# Patient Record
Sex: Male | Born: 1950 | Race: White | Hispanic: No | Marital: Married | Smoking: Never smoker
Health system: Southern US, Community
[De-identification: ages and names within clinical notes are randomized; demographics above are authoritative.]

## PROBLEM LIST (undated history)

## (undated) DIAGNOSIS — T4145XA Adverse effect of unspecified anesthetic, initial encounter: Secondary | ICD-10-CM

## (undated) DIAGNOSIS — K5790 Diverticulosis of intestine, part unspecified, without perforation or abscess without bleeding: Secondary | ICD-10-CM

## (undated) DIAGNOSIS — N529 Male erectile dysfunction, unspecified: Secondary | ICD-10-CM

## (undated) DIAGNOSIS — E785 Hyperlipidemia, unspecified: Secondary | ICD-10-CM

## (undated) DIAGNOSIS — R011 Cardiac murmur, unspecified: Secondary | ICD-10-CM

## (undated) DIAGNOSIS — T7840XA Allergy, unspecified, initial encounter: Secondary | ICD-10-CM

## (undated) DIAGNOSIS — I499 Cardiac arrhythmia, unspecified: Secondary | ICD-10-CM

## (undated) DIAGNOSIS — M1990 Unspecified osteoarthritis, unspecified site: Secondary | ICD-10-CM

## (undated) DIAGNOSIS — D126 Benign neoplasm of colon, unspecified: Secondary | ICD-10-CM

## (undated) DIAGNOSIS — G43909 Migraine, unspecified, not intractable, without status migrainosus: Secondary | ICD-10-CM

## (undated) DIAGNOSIS — I4891 Unspecified atrial fibrillation: Secondary | ICD-10-CM

## (undated) DIAGNOSIS — J309 Allergic rhinitis, unspecified: Secondary | ICD-10-CM

## (undated) DIAGNOSIS — M549 Dorsalgia, unspecified: Secondary | ICD-10-CM

## (undated) DIAGNOSIS — H04123 Dry eye syndrome of bilateral lacrimal glands: Secondary | ICD-10-CM

## (undated) DIAGNOSIS — T8859XA Other complications of anesthesia, initial encounter: Secondary | ICD-10-CM

## (undated) DIAGNOSIS — H269 Unspecified cataract: Secondary | ICD-10-CM

## (undated) HISTORY — DX: Diverticulosis of intestine, part unspecified, without perforation or abscess without bleeding: K57.90

## (undated) HISTORY — PX: NASAL SINUS SURGERY: SHX719

## (undated) HISTORY — DX: Benign neoplasm of colon, unspecified: D12.6

## (undated) HISTORY — DX: Unspecified osteoarthritis, unspecified site: M19.90

## (undated) HISTORY — DX: Other complications of anesthesia, initial encounter: T88.59XA

## (undated) HISTORY — DX: Dorsalgia, unspecified: M54.9

## (undated) HISTORY — DX: Cardiac murmur, unspecified: R01.1

## (undated) HISTORY — DX: Cardiac arrhythmia, unspecified: I49.9

## (undated) HISTORY — DX: Migraine, unspecified, not intractable, without status migrainosus: G43.909

## (undated) HISTORY — DX: Allergy, unspecified, initial encounter: T78.40XA

## (undated) HISTORY — DX: Adverse effect of unspecified anesthetic, initial encounter: T41.45XA

## (undated) HISTORY — DX: Male erectile dysfunction, unspecified: N52.9

## (undated) HISTORY — DX: Hyperlipidemia, unspecified: E78.5

## (undated) HISTORY — DX: Allergic rhinitis, unspecified: J30.9

## (undated) HISTORY — PX: TONSILLECTOMY: SHX5217

## (undated) HISTORY — PX: EYE SURGERY: SHX253

## (undated) HISTORY — DX: Unspecified cataract: H26.9

## (undated) HISTORY — PX: NASAL SEPTUM SURGERY: SHX37

## (undated) HISTORY — DX: Unspecified atrial fibrillation: I48.91

## (undated) HISTORY — DX: Dry eye syndrome of bilateral lacrimal glands: H04.123

---

## 2004-06-01 ENCOUNTER — Ambulatory Visit: Payer: Self-pay | Admitting: Internal Medicine

## 2004-12-01 ENCOUNTER — Ambulatory Visit: Payer: Self-pay | Admitting: Internal Medicine

## 2004-12-15 ENCOUNTER — Ambulatory Visit: Payer: Self-pay | Admitting: Family Medicine

## 2005-03-05 ENCOUNTER — Ambulatory Visit: Payer: Self-pay | Admitting: Internal Medicine

## 2005-04-06 ENCOUNTER — Ambulatory Visit: Payer: Self-pay | Admitting: Internal Medicine

## 2005-08-21 ENCOUNTER — Ambulatory Visit: Payer: Self-pay | Admitting: Internal Medicine

## 2005-09-13 ENCOUNTER — Ambulatory Visit: Payer: Self-pay | Admitting: Internal Medicine

## 2006-02-26 ENCOUNTER — Ambulatory Visit: Payer: Self-pay | Admitting: Internal Medicine

## 2006-03-04 ENCOUNTER — Ambulatory Visit: Payer: Self-pay | Admitting: Internal Medicine

## 2006-04-04 ENCOUNTER — Ambulatory Visit: Payer: Self-pay | Admitting: Gastroenterology

## 2006-04-11 ENCOUNTER — Ambulatory Visit: Payer: Self-pay | Admitting: Internal Medicine

## 2006-04-15 ENCOUNTER — Encounter: Payer: Self-pay | Admitting: Internal Medicine

## 2006-04-15 ENCOUNTER — Ambulatory Visit: Payer: Self-pay | Admitting: Gastroenterology

## 2007-03-26 DIAGNOSIS — J309 Allergic rhinitis, unspecified: Secondary | ICD-10-CM | POA: Insufficient documentation

## 2007-03-26 HISTORY — DX: Allergic rhinitis, unspecified: J30.9

## 2007-04-09 ENCOUNTER — Ambulatory Visit: Payer: Self-pay | Admitting: Internal Medicine

## 2007-07-01 ENCOUNTER — Telehealth: Payer: Self-pay | Admitting: *Deleted

## 2007-07-10 ENCOUNTER — Encounter: Payer: Self-pay | Admitting: Internal Medicine

## 2008-01-16 ENCOUNTER — Telehealth: Payer: Self-pay | Admitting: Internal Medicine

## 2008-02-23 ENCOUNTER — Ambulatory Visit: Payer: Self-pay | Admitting: Internal Medicine

## 2008-02-23 DIAGNOSIS — H6120 Impacted cerumen, unspecified ear: Secondary | ICD-10-CM | POA: Insufficient documentation

## 2008-02-26 ENCOUNTER — Telehealth: Payer: Self-pay | Admitting: Internal Medicine

## 2008-03-08 ENCOUNTER — Encounter: Payer: Self-pay | Admitting: Internal Medicine

## 2008-03-27 ENCOUNTER — Ambulatory Visit: Payer: Self-pay | Admitting: Internal Medicine

## 2008-05-04 ENCOUNTER — Ambulatory Visit: Payer: Self-pay | Admitting: Internal Medicine

## 2008-05-04 DIAGNOSIS — J069 Acute upper respiratory infection, unspecified: Secondary | ICD-10-CM | POA: Insufficient documentation

## 2008-09-27 ENCOUNTER — Telehealth: Payer: Self-pay | Admitting: Internal Medicine

## 2008-11-03 ENCOUNTER — Ambulatory Visit: Payer: Self-pay | Admitting: Internal Medicine

## 2008-11-03 DIAGNOSIS — G43909 Migraine, unspecified, not intractable, without status migrainosus: Secondary | ICD-10-CM

## 2008-11-03 HISTORY — DX: Migraine, unspecified, not intractable, without status migrainosus: G43.909

## 2009-03-17 ENCOUNTER — Telehealth: Payer: Self-pay | Admitting: Internal Medicine

## 2009-03-21 ENCOUNTER — Encounter: Payer: Self-pay | Admitting: Internal Medicine

## 2009-03-23 ENCOUNTER — Encounter: Payer: Self-pay | Admitting: Internal Medicine

## 2009-03-30 ENCOUNTER — Encounter (INDEPENDENT_AMBULATORY_CARE_PROVIDER_SITE_OTHER): Payer: Self-pay

## 2009-04-19 ENCOUNTER — Ambulatory Visit: Payer: Self-pay | Admitting: Internal Medicine

## 2009-04-19 DIAGNOSIS — N529 Male erectile dysfunction, unspecified: Secondary | ICD-10-CM

## 2009-04-19 HISTORY — DX: Male erectile dysfunction, unspecified: N52.9

## 2009-07-07 ENCOUNTER — Ambulatory Visit: Payer: Self-pay | Admitting: Internal Medicine

## 2009-07-07 LAB — CONVERTED CEMR LAB
BUN: 10 mg/dL (ref 6–23)
Basophils Absolute: 0.1 10*3/uL (ref 0.0–0.1)
Bilirubin Urine: NEGATIVE
Bilirubin, Direct: 0 mg/dL (ref 0.0–0.3)
CO2: 27 meq/L (ref 19–32)
Chloride: 98 meq/L (ref 96–112)
Cholesterol: 269 mg/dL — ABNORMAL HIGH (ref 0–200)
Creatinine, Ser: 0.9 mg/dL (ref 0.4–1.5)
Direct LDL: 156.9 mg/dL
Eosinophils Absolute: 0.3 10*3/uL (ref 0.0–0.7)
Glucose, Urine, Semiquant: NEGATIVE
MCHC: 32.4 g/dL (ref 30.0–36.0)
MCV: 98.5 fL (ref 78.0–100.0)
Monocytes Absolute: 0.6 10*3/uL (ref 0.1–1.0)
Neutrophils Relative %: 58.1 % (ref 43.0–77.0)
PSA: 1.59 ng/mL (ref 0.10–4.00)
Platelets: 310 10*3/uL (ref 150.0–400.0)
Protein, U semiquant: NEGATIVE
RDW: 12.9 % (ref 11.5–14.6)
Total Bilirubin: 0.6 mg/dL (ref 0.3–1.2)
VLDL: 54.2 mg/dL — ABNORMAL HIGH (ref 0.0–40.0)
WBC: 5.7 10*3/uL (ref 4.5–10.5)
pH: 5.5

## 2009-07-14 ENCOUNTER — Ambulatory Visit: Payer: Self-pay | Admitting: Internal Medicine

## 2009-08-02 ENCOUNTER — Telehealth: Payer: Self-pay | Admitting: Internal Medicine

## 2010-03-07 ENCOUNTER — Ambulatory Visit: Payer: Self-pay | Admitting: Internal Medicine

## 2010-05-17 ENCOUNTER — Ambulatory Visit: Payer: Self-pay | Admitting: Internal Medicine

## 2010-07-03 ENCOUNTER — Telehealth: Payer: Self-pay | Admitting: Internal Medicine

## 2010-07-25 NOTE — Assessment & Plan Note (Signed)
Summary: EAR PAIN / CONGESTION // RS   Vital Signs:  Patient profile:   60 year old male Weight:      163 pounds Temp:     98.5 degrees F oral BP sitting:   106 / 70  (right arm) Cuff size:   regular  Vitals Entered By: Duard Brady LPN (May 17, 2010 10:16 AM) CC: (R) ear pain , sinus congestion Is Patient Diabetic? No   CC:  (R) ear pain  and sinus congestion.  History of Present Illness: a 60 year old patient who is seen today with a one-day history of pain in and diminished hearing from the right ear.  There is been no drainage.  He has had cerumen impactions requiring irrigation in the past.  No fever.  He has had some minor sinus congestion.  He has dyslipidemia, and allergic rhinitis, which have been stable.  Allergies: 1)  ! Erythromycin  Past History:  Past Medical History: High Cholesterol MHA Allergic rhinitis  testosterone deficiency ED cerumen impaction  Review of Systems       The patient complains of decreased hearing.  The patient denies anorexia, fever, weight loss, weight gain, vision loss, hoarseness, chest pain, syncope, dyspnea on exertion, peripheral edema, prolonged cough, headaches, hemoptysis, abdominal pain, melena, hematochezia, severe indigestion/heartburn, hematuria, incontinence, genital sores, muscle weakness, suspicious skin lesions, transient blindness, difficulty walking, depression, unusual weight change, abnormal bleeding, enlarged lymph nodes, angioedema, breast masses, and testicular masses.    Physical Exam  General:  Well-developed,well-nourished,in no acute distress; alert,appropriate and cooperative throughout examination Head:  Normocephalic and atraumatic without obvious abnormalities. No apparent alopecia or balding. Eyes:  No corneal or conjunctival inflammation noted. EOMI. Perrla. Funduscopic exam benign, without hemorrhages, exudates or papilledema. Vision grossly normal. Ears:  cerumen impaction on the  right   Impression & Recommendations:  Problem # 1:  CERUMEN IMPACTION, RIGHT (ICD-380.4) irrigated until clear  Problem # 2:  ALLERGIC RHINITIS (ICD-477.9)  His updated medication list for this problem includes:    Claritin 10 Mg Tabs (Loratadine) .Marland Kitchen... 1 once daily as needed    Nasonex 50 Mcg/act Susp (Mometasone furoate) .Marland Kitchen... As directed  Complete Medication List: 1)  Niaspan 500 Mg Tbcr (Niacin (antihyperlipidemic)) 2)  Claritin 10 Mg Tabs (Loratadine) .Marland Kitchen.. 1 once daily as needed 3)  Relpax 20 Mg Tabs (Eletriptan hydrobromide) .Marland Kitchen.. 1 by mouth as needed migraine; may repeat in 4 hrs 4)  Nasonex 50 Mcg/act Susp (Mometasone furoate) .... As directed 5)  Androgel 50 Mg/5gm Gel (Testosterone) .Marland Kitchen.. 1 once daily 6)  Tramadol Hcl 50 Mg Tabs (Tramadol hcl) .Marland Kitchen.. 1 q6h as needed 7)  Levitra 10 Mg Tabs (Vardenafil hcl) .... As directed 8)  Amitriptyline Hcl 25 Mg Tabs (Amitriptyline hcl) .... One at bedtime  Patient Instructions: 1)  Please schedule a follow-up appointment as needed.   Orders Added: 1)  Est. Patient Level III [36644]

## 2010-07-25 NOTE — Procedures (Signed)
Summary: Colonoscopy / [facility]  Colonoscopy / [facility]   Imported By: Lennie Odor 12/09/2009 17:07:03  _____________________________________________________________________  External Attachment:    Type:   Image     Comment:   External Document

## 2010-07-25 NOTE — Miscellaneous (Signed)
Summary: Flu Shot/[place]  Flu Shot/[place]   Imported By: Maryln Gottron 12/09/2009 11:01:26  _____________________________________________________________________  External Attachment:    Type:   Image     Comment:   External Document

## 2010-07-25 NOTE — Assessment & Plan Note (Signed)
Summary: migraines/cjr   Vital Signs:  Patient profile:   60 year old male Weight:      161 pounds Temp:     98.1 degrees F oral BP sitting:   100 / 68  (left arm) Cuff size:   regular  Vitals Entered By: Duard Brady LPN (March 07, 2010 11:03 AM) CC: to discuss migraines Is Patient Diabetic? No Flu Vaccine Consent Questions     Do you have a history of severe allergic reactions to this vaccine? no    Any prior history of allergic reactions to egg and/or gelatin? no    Do you have a sensitivity to the preservative Thimersol? no    Do you have a past history of Guillan-Barre Syndrome? no    Do you currently have an acute febrile illness? no    Have you ever had a severe reaction to latex? no    Vaccine information given and explained to patient? yes    Are you currently pregnant? no    Lot Number:[id number]   Exp Date:12/23/2010   Site Given  Left Deltoid IM   CC:  to discuss migraines.  History of Present Illness: 60 year old patient who has a history of migraine headaches.  More recently, he has had increasing frequency.  They continue  to respond well to Relpax.  He has been sleeping poorly, and also has some allergy related issues that he still may be factors.  He is having several headaches.  Each week  Allergies: 1)  ! Erythromycin  Past History:  Past Medical History: Reviewed history from 07/14/2009 and no changes required. High Cholesterol MHA Allergic rhinitis  testosterone deficiency ED  Review of Systems       The patient complains of headaches.  The patient denies anorexia, fever, weight loss, weight gain, vision loss, decreased hearing, hoarseness, chest pain, syncope, dyspnea on exertion, peripheral edema, prolonged cough, hemoptysis, abdominal pain, melena, hematochezia, severe indigestion/heartburn, hematuria, incontinence, genital sores, muscle weakness, suspicious skin lesions, transient blindness, difficulty walking, depression, unusual  weight change, abnormal bleeding, enlarged lymph nodes, angioedema, breast masses, and testicular masses.    Physical Exam  General:  Well-developed,well-nourished,in no acute distress; alert,appropriate and cooperative throughout examination Head:  Normocephalic and atraumatic without obvious abnormalities. No apparent alopecia or balding. Eyes:  No corneal or conjunctival inflammation noted. EOMI. Perrla. Funduscopic exam benign, without hemorrhages, exudates or papilledema. Vision grossly normal. Ears:  External ear exam shows no significant lesions or deformities.  Otoscopic examination reveals clear canals, tympanic membranes are intact bilaterally without bulging, retraction, inflammation or discharge. Hearing is grossly normal bilaterally. Mouth:  Oral mucosa and oropharynx without lesions or exudates.  Teeth in good repair. Neck:  No deformities, masses, or tenderness noted. Lungs:  Normal respiratory effort, chest expands symmetrically. Lungs are clear to auscultation, no crackles or wheezes. Heart:  Normal rate and regular rhythm. S1 and S2 normal without gallop, murmur, click, rub or other extra sounds. Psych:  Cognition and judgment appear intact. Alert and cooperative with normal attention span and concentration. No apparent delusions, illusions, hallucinations   Impression & Recommendations:  Problem # 1:  MIGRAINE HEADACHE (ICD-346.90)  His updated medication list for this problem includes:    Relpax 20 Mg Tabs (Eletriptan hydrobromide) .Marland Kitchen... 1 by mouth as needed migraine; may repeat in 4 hrs    Tramadol Hcl 50 Mg Tabs (Tramadol hcl) .Marland Kitchen... 1 q6h as needed will try amitriptyline 25 mg at bedtime to assist with sleep, and migraine prophylaxis  Complete  Medication List: 1)  Niaspan 500 Mg Tbcr (Niacin (antihyperlipidemic)) 2)  Claritin 10 Mg Tabs (Loratadine) .Marland Kitchen.. 1 once daily as needed 3)  Relpax 20 Mg Tabs (Eletriptan hydrobromide) .Marland Kitchen.. 1 by mouth as needed migraine; may  repeat in 4 hrs 4)  Nasonex 50 Mcg/act Susp (Mometasone furoate) .... As directed 5)  Androgel 50 Mg/5gm Gel (Testosterone) .Marland Kitchen.. 1 once daily 6)  Tramadol Hcl 50 Mg Tabs (Tramadol hcl) .Marland Kitchen.. 1 q6h as needed 7)  Levitra 10 Mg Tabs (Vardenafil hcl) .... As directed 8)  Amitriptyline Hcl 25 Mg Tabs (Amitriptyline hcl) .... One at bedtime  Other Orders: Admin 1st Vaccine (52841) Flu Vaccine 11yrs + (32440)  Patient Instructions: 1)  Please schedule a follow-up appointment as needed. Prescriptions: AMITRIPTYLINE HCL 25 MG TABS (AMITRIPTYLINE HCL) one at bedtime  #90 x 4   Entered and Authorized by:   Gordy Savers  MD   Signed by:   Gordy Savers  MD on 03/07/2010   Method used:   Print then Give to Patient   RxID:   [id number] LEVITRA 10 MG TABS (VARDENAFIL HCL) as directed  #12 x 12   Entered and Authorized by:   Gordy Savers  MD   Signed by:   Gordy Savers  MD on 03/07/2010   Method used:   Print then Give to Patient   RxID:   [id number] TRAMADOL HCL 50 MG TABS (TRAMADOL HCL) 1 q6h as needed  #100 x 3   Entered and Authorized by:   Gordy Savers  MD   Signed by:   Gordy Savers  MD on 03/07/2010   Method used:   Print then Give to Patient   RxID:   [id number] ANDROGEL 50 MG/5GM GEL (TESTOSTERONE) 1 once daily  #30 x 12   Entered and Authorized by:   Gordy Savers  MD   Signed by:   Gordy Savers  MD on 03/07/2010   Method used:   Print then Give to Patient   RxID:   [id number] NASONEX 50 MCG/ACT  SUSP (MOMETASONE FUROATE) as directed  #17 Gram x 6   Entered and Authorized by:   Gordy Savers  MD   Signed by:   Gordy Savers  MD on 03/07/2010   Method used:   Print then Give to Patient   RxID:   [id number] RELPAX 20 MG  TABS (ELETRIPTAN HYDROBROMIDE) 1 by mouth as needed migraine; may repeat in 4 hrs  #12 x 6   Entered and Authorized by:   Gordy Savers  MD   Signed by:   Gordy Savers  MD on 03/07/2010   Method used:   Print then Give to Patient   RxID:   [id number]

## 2010-07-25 NOTE — Progress Notes (Signed)
Summary: Pt says [facility] needs quantity of Tramadol script  Phone Note Call from Patient Call back at Home Phone [phone]   Caller: Patient Summary of Call: Pt called and said [facility], needs to know the quanity of Tramadol, before they can fill script. Please call Tramadol in to pharmacy. Initial call taken by: Lucy Antigua,  August 02, 2009 9:52 AM    Prescriptions: TRAMADOL HCL 50 MG TABS (TRAMADOL HCL) 1 q6h as needed  #60 x 2   Entered by:   Raechel Ache, RN   Authorized by:   Gordy Savers  MD   Signed by:   Raechel Ache, RN on 08/02/2009   Method used:   Electronically to        [facility] (retail)       [address]       [place], [place]  [address]       Ph: [phone]       Fax: [phone]   RxID:   [id number]

## 2010-07-25 NOTE — Assessment & Plan Note (Signed)
Summary: cpx/njr   Vital Signs:  Patient profile:   60 year old male Weight:      166 pounds BMI:     23.24 Temp:     99.5 degrees F oral BP sitting:   90 / 66  (left arm) Cuff size:   regular  Vitals Entered By: Raechel Ache, RN (July 14, 2009 9:36 AM) CC: CPX, labs done.   CC:  CPX and labs done.Marland Kitchen  History of Present Illness:  60 year old patient who is seen today for a wellness exam.  Medical problems include hypercholesterolemia.  This was discussed.  His total cholesterol 269, up from 219 in the past.  He is allergic rhinitis.  History migraine headaches  Preventive Screening-Counseling & Management  Caffeine-Diet-Exercise     Does Patient Exercise: no [MLI_FORM:[id number]][MLI_FORM:[id number]][MLI_FORM:[id number]][MLI_FORM:[id number]][MLI_FORM:[id number]] [MLI_FORM:[id number]][MLI_FORM:[id number]][MLI_FORM:[id number]][MLI_FORM:[id number]]

## 2010-07-27 NOTE — Progress Notes (Signed)
Summary: refill niaspan  Phone Note From Pharmacy   Caller: [facility] [address] Reason for Call: Needs renewal Summary of Call: needs new rx and sig for niaspan -   gave once daily #90 rf3 to [facility] at [facility]. [facility] Initial call taken by: Duard Brady LPN,  July 03, 2010 5:03 PM    New/Updated Medications: NIASPAN 500 MG TBCR (NIACIN (ANTIHYPERLIPIDEMIC)) qd Prescriptions: NIASPAN 500 MG TBCR (NIACIN (ANTIHYPERLIPIDEMIC)) qd  #90 x 3   Entered by:   Duard Brady LPN   Authorized by:   Gordy Savers  MD   Signed by:   Duard Brady LPN on 44/08/4740   Method used:   Historical   RxID:   [id number]

## 2010-09-27 ENCOUNTER — Other Ambulatory Visit: Payer: Self-pay | Admitting: Internal Medicine

## 2010-10-09 ENCOUNTER — Encounter: Payer: Self-pay | Admitting: Internal Medicine

## 2010-10-09 ENCOUNTER — Ambulatory Visit (INDEPENDENT_AMBULATORY_CARE_PROVIDER_SITE_OTHER): Payer: PRIVATE HEALTH INSURANCE | Admitting: Internal Medicine

## 2010-10-09 VITALS — BP 94/62 | Temp 99.5°F | Wt 168.0 lb

## 2010-10-09 DIAGNOSIS — J309 Allergic rhinitis, unspecified: Secondary | ICD-10-CM

## 2010-10-09 MED ORDER — METHYLPREDNISOLONE ACETATE 80 MG/ML IJ SUSP
80.0000 mg | Freq: Once | INTRAMUSCULAR | Status: AC
  Administered 2010-10-09: 80 mg via INTRAMUSCULAR

## 2010-10-09 MED ORDER — HYDROCODONE-HOMATROPINE 5-1.5 MG/5ML PO SYRP: 5.0000 mL | ORAL_SOLUTION | Freq: Four times a day (QID) | ORAL | Status: AC | PRN

## 2010-10-09 NOTE — Progress Notes (Signed)
Addended by: Duard Brady on: 10/09/2010 11:54 AM   Modules accepted: Orders

## 2010-10-09 NOTE — Progress Notes (Signed)
  Subjective:    Patient ID: Joseph Gill, male    DOB: 1950-09-26, 60 y.o.   MRN: [id number]  HPI  60 year old patient has a history of allergic rhinitis. For the past 4 days she has had increasing sinus congestion drainage and nonproductive cough he does develop mild sore throat. He has been using antihistamines nasal steroids and decongestants as well as cough and expectorants without much benefit.    Review of Systems  Constitutional: Negative for fever, chills, appetite change and fatigue.  HENT: Positive for congestion, rhinorrhea and postnasal drip. Negative for hearing loss, ear pain, sore throat, trouble swallowing, neck stiffness, dental problem, voice change and tinnitus.   Eyes: Negative for pain, discharge and visual disturbance.  Respiratory: Positive for cough. Negative for chest tightness, wheezing and stridor.   Cardiovascular: Negative for chest pain, palpitations and leg swelling.  Gastrointestinal: Negative for nausea, vomiting, abdominal pain, diarrhea, constipation, blood in stool and abdominal distention.  Genitourinary: Negative for urgency, hematuria, flank pain, discharge, difficulty urinating and genital sores.  Musculoskeletal: Negative for myalgias, back pain, joint swelling, arthralgias and gait problem.  Skin: Negative for rash.  Neurological: Negative for dizziness, syncope, speech difficulty, weakness, numbness and headaches.  Hematological: Negative for adenopathy. Does not bruise/bleed easily.  Psychiatric/Behavioral: Negative for behavioral problems and dysphoric mood. The patient is not nervous/anxious.        Objective:   Physical Exam  Constitutional: He is oriented to person, place, and time. He appears well-developed.  HENT:  Head: Normocephalic.  Right Ear: External ear normal.  Left Ear: External ear normal.  Eyes: Conjunctivae and EOM are normal.  Neck: Normal range of motion.  Cardiovascular: Normal rate, regular rhythm and normal heart  sounds.   Pulmonary/Chest: Effort normal and breath sounds normal. No respiratory distress. He has no wheezes. He has no rales.  Abdominal: Bowel sounds are normal.  Musculoskeletal: Normal range of motion. He exhibits no edema and no tenderness.  Neurological: He is alert and oriented to person, place, and time.  Psychiatric: He has a normal mood and affect. His behavior is normal.          Assessment & Plan:   Allergic rhinitis flare. We'll continue his basic medications. We'll treat with Depo-Medrol 80 mg IM. We'll treat symptomatically with Hydromet for significant cough

## 2010-10-09 NOTE — Patient Instructions (Signed)
Get plenty of rest, Drink lots of  clear liquids, and use Tylenol or ibuprofen for fever and discomfort.    Call or return to clinic prn if these symptoms worsen or fail to improve as anticipated.  

## 2010-11-10 NOTE — Assessment & Plan Note (Signed)
[facility] OFFICE NOTE   Joseph Gill, Joseph Gill                    MRN:          [id number]  DATE:03/04/2006                            DOB:          1951/06/11    A 60 year old gentleman seen today for a wellness exam.  He has a history of  allergic rhinitis and migraine headaches.  He has done reasonably well,  although through the summer did have one or two migraines weekly.  These are  __________  relieved by Triptan therapy.  He has history of mild  dyslipidemia, allergic rhinitis.  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   REVIEW OF SYSTEMS:  Negative except for some erectile dysfunction over the  past year.   FAMILY HISTORY:  Fairly non-contributory.  A father age 48 has had a heart  attack and DJD.  Mother age 15 has asthma. No siblings.  Family history is  positive for prostate cancer, first cousin with colon cancer, uncle lung  cancer.   EXAMINATION:  GENERAL:  Exam revealed a healthy appearing male in no acute  distress.  VITAL SIGNS:  Blood pressure was 110/74.  SKIN:  Negative.  ENT:  Fundi, ear, nose and throat clear.  CHEST:  Clear.  CARDIOVASCULAR:  Normal heart sounds, no murmurs.  ABDOMEN:  Benign.  EXTERNAL GENITALIA:  Normal.  RECTAL:  Prostate +1 and benign.  Stool heme negative.  EXTREMITIES:  Negative with full peripheral pulses.   IMPRESSION:  1. Migraine headaches.  2. Allergic rhinitis.   DISPOSITION:  1. Set-up for screening colonoscopy.  2. Will continue with Niacin therapy.  We will pretreat with aspirin since      he is having some side effects.  3. Recheck in one year.                                   Gordy Savers, MD   PFK/MedQ  DD:  03/04/2006  DT:  03/04/2006  Job #:  [id number]

## 2011-01-17 ENCOUNTER — Encounter: Payer: Self-pay | Admitting: Family Medicine

## 2011-01-17 ENCOUNTER — Ambulatory Visit (INDEPENDENT_AMBULATORY_CARE_PROVIDER_SITE_OTHER): Payer: PRIVATE HEALTH INSURANCE | Admitting: Family Medicine

## 2011-01-17 VITALS — BP 130/82 | Temp 98.6°F | Wt 166.0 lb

## 2011-01-17 DIAGNOSIS — M545 Low back pain, unspecified: Secondary | ICD-10-CM

## 2011-01-17 MED ORDER — METAXALONE 800 MG PO TABS: 800.0000 mg | ORAL_TABLET | Freq: Three times a day (TID) | ORAL | Status: AC

## 2011-01-17 NOTE — Patient Instructions (Signed)
Low Back Sprain with Rehab    A sprain is an injury in which a ligament is torn. The ligaments of the lower back are vulnerable to sprains. However, they are strong and require great force to be injured. These ligaments are important for stabilizing the spinal column. Sprains are classified into three categories. Grade 1 sprains cause pain, but the tendon is not lengthened. Grade 2 sprains include a lengthened ligament, due to the ligament being stretched or partially ruptured. With grade 2 sprains there is still function, although the function may be decreased. Grade 3 sprains involve a complete tear of the tendon or muscle, and function is usually impaired.   SYMPTOMS  Severe pain in the lower back.  Sometimes, a feeling of a "pop," "snap," or tear, at the time of injury.  Tenderness and sometimes swelling at the injury site.  Uncommonly, bruising (contusion) within 48 hours of injury.  Muscle spasms in the back.   CAUSES  Low back sprains occur when a force is placed on the ligaments that is greater than they can handle. Common causes of injury include:  Performing a stressful act while off-balance.  Repetitive stressful activities that involve movement of the lower back.  Direct hit (trauma) to the lower back.   RISK INCREASES WITH   Contact sports (football, wrestling).  Collisions (major skiing accidents).  Sports that require throwing or lifting (baseball, weightlifting).  Sports involving twisting of the spine (gymnastics, diving, tennis, golf).  Poor strength and flexibility.  Inadequate protection.  Previous back injury or surgery (especially fusion).   PREVENTIVE MEASURES   Wear properly fitted and padded protective equipment.  Warm up and stretch properly before activity.  Allow for adequate recovery between workouts.  Maintain physical fitness: l Strength, flexibility, and endurance. l Cardiovascular fitness.  Maintain a healthy body weight.     PROGNOSIS If treated properly, low back sprains usually heal with non-surgical treatment. The length of time for healing depends on the severity of the injury.    POSSIBLE COMPLICATIONS   Recurring symptoms, resulting in a chronic problem.  Chronic inflammation and pain in the low back.  Delayed healing or resolution of symptoms, especially if activity is resumed too soon.  Prolonged impairment.  Unstable or arthritic joints of the low back.   GENERAL TREATMENT CONSIDERATIONS  Treatment first involves the use of ice and medicine, to reduce pain and inflammation. The use of strengthening and stretching exercises may help reduce pain with activity. These exercises may be performed at home or with a therapist. Severe injuries may require referral to a therapist for further evaluation and treatment, such as ultrasound. Your caregiver may advise that you wear a back brace or corset, to help reduce pain and discomfort. Often, prolonged bed rest results in greater harm then benefit. Corticosteroid injections may be recommended. However, these should be reserved for the most serious cases. It is important to avoid using your back when lifting objects. At night, sleep on your back on a firm mattress, with a pillow placed under your knees. If non-surgical treatment is unsuccessful, surgery may be needed.    MEDICATION:   If pain medicine is needed, nonsteroidal anti-inflammatory medicines (aspirin and ibuprofen), or other minor pain relievers (acetaminophen), are often advised.   Do not take pain medicine for 7 days before surgery.   Prescription pain relievers may be given, if your caregiver thinks they are needed. Use only as directed and only as much as you need.  Ointments applied to  the skin may be helpful.  Corticosteroid injections may be given by your caregiver. These injections should be reserved for the most serious cases, because they may only be given a certain number of times.    HEAT AND COLD:   Cold treatment (icing) should be applied for 10 to 15 minutes every 2 to 3 hours for inflammation and pain, and immediately after activity that aggravates your symptoms. Use ice packs or an ice massage.  Heat treatment may be used before performing stretching and strengthening activities prescribed by your caregiver, physical therapist, or athletic trainer. Use a heat pack or a warm water soak.     SEEK MEDICAL CARE IF:   Symptoms get worse or do not improve in 2 to 4 weeks, despite treatment.  You develop numbness or weakness in either leg.  You lose bowel or bladder function.  Any of the following occur after surgery: fever, increased pain, swelling, redness, drainage of fluids, or bleeding in the affected area.  New, unexplained symptoms develop. (Drugs used in treatment may produce side effects.)     EXERCISES   RANGE OF MOTION AND STRETCHING EXERCISES - Low Back Sprain Most people with lower back pain will find that their symptoms get worse with excessive bending forward (flexion) or arching at the lower back (extension). The exercises that will help resolve your symptoms will focus on the opposite motion.    Your physician, physical therapist or athletic trainer will help you determine which exercises will be most helpful to resolve your lower back pain. Do not complete any exercises without first consulting with your caregiver. Discontinue any exercises which make your symptoms worse, until you speak to your caregiver.    If you have pain, numbness or tingling which travels down into your buttocks, leg or foot, the goal of the therapy is for these symptoms to move closer to your back and eventually resolve. Sometimes, these leg symptoms will get better, but your lower back pain may worsen. This is often an indication of progress in your rehabilitation. Be very alert to any changes in your symptoms and the activities in which you participated in the 24 hours prior  to the change. Sharing this information with your caregiver will allow him or her to most efficiently treat your condition.   These exercises may help you when beginning to rehabilitate your injury. Your symptoms may resolve with or without further involvement from your physician, physical therapist or athletic trainer. While completing these exercises, remember:   Restoring tissue flexibility helps normal motion to return to the joints. This allows healthier, less painful movement and activity.  An effective stretch should be held for at least 30 seconds.  A stretch should never be painful. You should only feel a gentle lengthening or release in the stretched tissue.      FLEXION RANGE OF MOTION AND STRETCHING EXERCISES:  STRETCH - Flexion, Single Knee to Chest   Lie on a firm bed or floor with both legs extended in front of you.  Keeping one leg in contact with the floor, bring your opposite knee to your chest. Hold your leg in place by either grabbing behind your thigh or at your knee.  Pull until you feel a gentle stretch in your low back. Hold __________ seconds.  Slowly release your grasp and repeat the exercise with the opposite side. Repeat __________ times. Complete this exercise __________ times per day.     STRETCH - Flexion, Double Knee to Chest  Lie on a firm bed or floor with both legs extended in front of you.  Keeping one leg in contact with the floor, bring your opposite knee to your chest.    Tense your stomach muscles to support your back and then lift your other knee to your chest. Hold your legs in place by either grabbing behind your thighs or at your knees.  Pull both knees toward your chest until you feel a gentle stretch in your low back. Hold __________ seconds.  Tense your stomach muscles and slowly return one leg at a time to the floor. Repeat __________ times. Complete this exercise __________ times per day.     STRETCH - Low Trunk Rotation   Lie  on a firm bed or floor. Keeping your legs in front of you, bend your knees so they are both pointed toward the ceiling and your feet are flat on the floor.  Extend your arms out to the side. This will stabilize your upper body by keeping your shoulders in contact with the floor.  Gently and slowly drop both knees together to one side until you feel a gentle stretch in your low back. Hold for __________ seconds.   Tense your stomach muscles to support your lower back as you bring your knees back to the starting position. Repeat the exercise to the other side. Repeat __________ times. Complete this exercise __________ times per day      EXTENSION RANGE OF MOTION AND FLEXIBILITY EXERCISES:  STRETCH - Extension, Prone on Elbows   Lie on your stomach on the floor, a bed will be too soft. Place your palms about shoulder width apart and at the height of your head.  Place your elbows under your shoulders. If this is too painful, stack pillows under your chest.  Allow your body to relax so that your hips drop lower and make contact more completely with the floor.  Hold this position for __________ seconds.  Slowly return to lying flat on the floor. Repeat __________ times. Complete this exercise __________ times per day.     RANGE OF MOTION - Extension, Prone Press Ups   Lie on your stomach on the floor, a bed will be too soft. Place your palms about shoulder width apart and at the height of your head.  Keeping your back as relaxed as possible, slowly straighten your elbows while keeping your hips on the floor. You may adjust the placement of your hands to maximize your comfort. As you gain motion, your hands will come more underneath your shoulders.  Hold this position __________ seconds.  Slowly return to lying flat on the floor. Repeat __________ times. Complete this exercise __________ times per day.     RANGE OF MOTION- Quadruped, Neutral Spine   Assume a hands and knees position  on a firm surface. Keep your hands under your shoulders and your knees under your hips. You may place padding under your knees for comfort.    Drop your head and point your tailbone toward the ground below you. This will round out your lower back like an angry cat. Hold this position for __________ seconds.   Slowly lift your head and release your tail bone so that your back sags into a large arch, like an old horse.  Hold this position for __________ seconds.   Repeat this until you feel limber in your low back.  Now, find your "sweet spot." This will be the most comfortable position somewhere between the two previous  positions. This is your neutral spine. Once you have found this position, tense your stomach muscles to support your low back.  Hold this position for __________ seconds. Repeat __________ times. Complete this exercise __________ times per day.      STRENGTHENING EXERCISES - Low Back Sprain These exercises may help you when beginning to rehabilitate your injury. These exercises should be done near your "sweet spot." This is the neutral, low-back arch, somewhere between fully rounded and fully arched, that is your least painful position. When performed in this safe range of motion, these exercises can be used for people who have either a flexion or extension based injury. These exercises may resolve your symptoms with or without further involvement from your physician, physical therapist or athletic trainer. While completing these exercises, remember:   Muscles can gain both the endurance and the strength needed for everyday activities through controlled exercises.  Complete these exercises as instructed by your physician, physical therapist or athletic trainer. Increase the resistance and repetitions only as guided.  You may experience muscle soreness or fatigue, but the pain or discomfort you are trying to eliminate should never worsen during these exercises. If this pain does  worsen, stop and make certain you are following the directions exactly. If the pain is still present after adjustments, discontinue the exercise until you can discuss the trouble with your caregiver.     STRENGTHENING - Deep Abdominals, Pelvic Tilt   Lie on a firm bed or floor. Keeping your legs in front of you, bend your knees so they are both pointed toward the ceiling and your feet are flat on the floor.  Tense your lower abdominal muscles to press your low back into the floor.  This motion will rotate your pelvis so that your tail bone is scooping upwards rather than pointing at your feet or into the floor. With a gentle tension and even breathing, hold this position for __________ seconds. Repeat __________ times. Complete this exercise __________ times per day.      STRENGTHENING - Abdominals, Crunches   Lie on a firm bed or floor. Keeping your legs in front of you, bend your knees so they are both pointed toward the ceiling and your feet are flat on the floor. Cross your arms over your chest.    Slightly tip your chin down without bending your neck.  Tense your abdominals and slowly lift your trunk high enough to just clear your shoulder blades. Lifting higher can put excessive stress on the lower back and does not further strengthen your abdominal muscles.  Control your return to the starting position. Repeat __________ times. Complete this exercise __________ times per day.     STRENGTHENING - Quadruped, Opposite UE/LE Lift   Assume a hands and knees position on a firm surface. Keep your hands under your shoulders and your knees under your hips. You may place padding under your knees for comfort.    Find your neutral spine and gently tense your abdominal muscles so that you can maintain this position. Your shoulders and hips should form a rectangle that is parallel with the floor and is not twisted.   Keeping your trunk steady, lift your right hand no higher than your shoulder  and then your left leg no higher than your hip. Make sure you are not holding your breath. Hold this position for __________ seconds.  Continuing to keep your abdominal muscles tense and your back steady, slowly return to your starting position. Repeat with the  opposite arm and leg. Repeat __________ times. Complete this exercise __________ times per day.      STRENGTHENING - Abdominals and Quadriceps, Straight Leg Raise   Lie on a firm bed or floor with both legs extended in front of you.  Keeping one leg in contact with the floor, bend the other knee so that your foot can rest flat on the floor.  Find your neutral spine, and tense your abdominal muscles to maintain your spinal position throughout the exercise.  Slowly lift your straight leg off the floor about 6 inches for a count of 15, making sure to not hold your breath.  Still keeping your neutral spine, slowly lower your leg all the way to the floor.   Repeat this exercise with each leg __________ times. Complete this exercise __________ times per day.     POSTURE AND BODY MECHANICS CONSIDERATIONS - Low Back Sprain Keeping correct posture when sitting, standing or completing your activities will reduce the stress put on different body tissues, allowing injured tissues a chance to heal and limiting painful experiences. The following are general guidelines for improved posture. Your physician or physical therapist will provide you with any instructions specific to your needs. While reading these guidelines, remember:  The exercises prescribed by your provider will help you have the flexibility and strength to maintain correct postures.  The correct posture provides the best environment for your joints to work. All of your joints have less wear and tear when properly supported by a spine with good posture. This means you will experience a healthier, less painful body.  Correct posture must be practiced with all of your activities,  especially prolonged sitting and standing. Correct posture is as important when doing repetitive low-stress activities (typing) as it is when doing a single heavy-load activity (lifting).     RESTING POSITIONS Consider which positions are most painful for you when choosing a resting position. If you have pain with flexion-based activities (sitting, bending, stooping, squatting), choose a position that allows you to rest in a less flexed posture. You would want to avoid curling into a fetal position on your side. If your pain worsens with extension-based activities (prolonged standing, working overhead), avoid resting in an extended position such as sleeping on your stomach. Most people will find more comfort when they rest with their spine in a more neutral position, neither too rounded nor too arched. Lying on a non-sagging bed on your side with a pillow between your knees, or on your back with a pillow under your knees will often provide some relief.  Keep in mind, being in any one position for a prolonged period of time, no matter how correct your posture, can still lead to stiffness.    PROPER SITTING POSTURE In order to minimize stress and discomfort on your spine, you must sit with correct posture. Sitting with good posture should be effortless for a healthy body. Returning to good posture is a gradual process. Many people can work toward this most comfortably by using various supports until they have the flexibility and strength to maintain this posture on their own.   When sitting with proper posture, your ears will fall over your shoulders and your shoulders will fall over your hips. You should use the back of the chair to support your upper back. Your lower back will be in a neutral position, just slightly arched. You may place a small pillow or folded towel at the base of your lower back for  support.    When working at a desk, create an environment that supports good, upright posture.  Without extra support, muscles tire, which leads to excessive strain on joints and other tissues. Keep these recommendations in mind:   CHAIR:    A chair should be able to slide under your desk when your back makes contact with the back of the chair. This allows you to work closely.  The chair's height should allow your eyes to be level with the upper part of your monitor and your hands to be slightly lower than your elbows.      BODY POSITION  Your feet should make contact with the floor. If this is not possible, use a foot rest.  Keep your ears over your shoulders. This will reduce stress on your neck and low back.     INCORRECT SITTING POSTURES  If you are feeling tired and unable to assume a healthy sitting posture, do not slouch or slump. This puts excessive strain on your back tissues, causing more damage and pain. Healthier options include:  Using more support, like a lumbar pillow.  Switching tasks to something that requires you to be upright or walking.  Talking a brief walk.  Lying down to rest in a neutral-spine position.      PROLONGED STANDING WHILE SLIGHTLY LEANING FORWARD  When completing a task that requires you to lean forward while standing in one place for a long time, place either foot up on a stationary 2-4 inch high object to help maintain the best posture. When both feet are on the ground, the lower back tends to lose its slight inward curve. If this curve flattens (or becomes too large), then the back and your other joints will experience too much stress, tire more quickly, and can cause pain.       CORRECT STANDING POSTURES Proper standing posture should be assumed with all daily activities, even if they only take a few moments, like when brushing your teeth. As in sitting, your ears should fall over your shoulders and your shoulders should fall over your hips. You should keep a slight tension in your abdominal muscles to brace your spine. Your tailbone  should point down to the ground, not behind your body, resulting in an over-extended swayback posture.      INCORRECT STANDING POSTURES  Common incorrect standing postures include a forward head, locked knees and/or an excessive swayback.     WALKING Walk with an upright posture. Your ears, shoulders and hips should all line-up.     PROLONGED ACTIVITY IN A FLEXED POSITION When completing a task that requires you to bend forward at your waist or lean over a low surface, try to find a way to stabilize 3 out of 4 of your limbs. You can place a hand or elbow on your thigh or rest a knee on the surface you are reaching across. This will provide you more stability, so that your muscles do not tire as quickly. By keeping your knees relaxed, or slightly bent, you will also reduce stress across your lower back.     CORRECT LIFTING TECHNIQUES DO :   Assume a wide stance. This will provide you more stability and the opportunity to get as close as possible to the object which you are lifting.  Tense your abdominals to brace your spine. Bend at the knees and hips. Keeping your back locked in a neutral-spine position, lift using your leg muscles. Lift with your legs, keeping your  back straight.  Test the weight of unknown objects before attempting to lift them.  Try to keep your elbows locked down at your sides in order get the best strength from your shoulders when carrying an object.  Always ask for help when lifting heavy or awkward objects.    INCORRECT LIFTING TECHNIQUES DO NOT:   Lock your knees when lifting, even if it is a small object.  Bend and twist. Pivot at your feet or move your feet when needing to change directions.  Assume that you can safely pick up even a paperclip without proper posture.   Document Released: 06/11/2005  Document Re-Released: 04/08/2009 [facility] Patient Information 2011 [place], [place].

## 2011-01-17 NOTE — Progress Notes (Signed)
  Subjective:    Patient ID: Joseph Gill, male    DOB: [birth year], 60 y.o.   MRN: [id number]  HPI Lumbar back pain. No injury. Onset 2 days ago. Diffuse lower lumbar region. No radiculopathy symptoms. Symptoms are moderate to severe at times.  Achy to sharp quality.  Patient took wife's cyclobenzaprine without much improvement. Also try tramadol. Ice and heat. Ice did not help. Heat helps slightly. Also use some Advil with minimal relief. No topical creams.  Patient denies fever, chills, dysuria, abdominal pain, or recent appetite or weight changes   Review of Systems See history of present illness    Objective:   Physical Exam  Constitutional: He appears well-developed and well-nourished. No distress.  Cardiovascular: Normal rate and regular rhythm.   Pulmonary/Chest: Effort normal and breath sounds normal. No respiratory distress. He has no wheezes. He has no rales.  Musculoskeletal: He exhibits no edema.       He has some palpable muscle tension left lower lumbar region. No spinal tenderness. Straight leg raise is negative  Good distal foot pulses  Neurological:       No focal strength deficits. Only trace reflexes knee and ankle bilaterally. No sensory impairment  Skin: No rash noted.          Assessment & Plan:  Low back pain. Suspect muscular. Skelaxin 800 mg 3 times a day and continued heat, ice, and topical rubs. Extension stretches given. Followup with primary in 2 weeks if no better

## 2011-04-26 ENCOUNTER — Other Ambulatory Visit: Payer: Self-pay | Admitting: Internal Medicine

## 2011-06-09 ENCOUNTER — Other Ambulatory Visit: Payer: Self-pay | Admitting: Internal Medicine

## 2011-07-03 ENCOUNTER — Other Ambulatory Visit: Payer: Self-pay | Admitting: Internal Medicine

## 2011-07-16 ENCOUNTER — Other Ambulatory Visit: Payer: Self-pay | Admitting: Internal Medicine

## 2011-08-18 ENCOUNTER — Other Ambulatory Visit: Payer: Self-pay | Admitting: Internal Medicine

## 2011-08-24 ENCOUNTER — Ambulatory Visit (INDEPENDENT_AMBULATORY_CARE_PROVIDER_SITE_OTHER): Payer: PRIVATE HEALTH INSURANCE | Admitting: Internal Medicine

## 2011-08-24 ENCOUNTER — Encounter: Payer: Self-pay | Admitting: Internal Medicine

## 2011-08-24 DIAGNOSIS — J069 Acute upper respiratory infection, unspecified: Secondary | ICD-10-CM

## 2011-08-24 DIAGNOSIS — J309 Allergic rhinitis, unspecified: Secondary | ICD-10-CM

## 2011-08-24 MED ORDER — TRAMADOL HCL 50 MG PO TABS: 50.0000 mg | ORAL_TABLET | Freq: Four times a day (QID) | ORAL | Status: DC | PRN

## 2011-08-24 MED ORDER — VARDENAFIL HCL 10 MG PO TABS: 10.0000 mg | ORAL_TABLET | Freq: Every day | ORAL | Status: DC | PRN

## 2011-08-24 MED ORDER — MOMETASONE FUROATE 50 MCG/ACT NA SUSP: 2.0000 | Freq: Every day | NASAL | Status: DC

## 2011-08-24 MED ORDER — TESTOSTERONE 50 MG/5GM (1%) TD GEL: 5.0000 g | Freq: Every day | TRANSDERMAL | Status: DC

## 2011-08-24 MED ORDER — AMITRIPTYLINE HCL 25 MG PO TABS: 25.0000 mg | ORAL_TABLET | Freq: Every day | ORAL | Status: DC

## 2011-08-24 MED ORDER — PREDNISONE 20 MG PO TABS: 20.0000 mg | ORAL_TABLET | Freq: Every day | ORAL | Status: AC

## 2011-08-24 NOTE — Patient Instructions (Signed)
Get plenty of rest, Drink lots of  clear liquids, and use Tylenol or ibuprofen for fever and discomfort.    Call or return to clinic prn if these symptoms worsen or fail to improve as anticipated.  

## 2011-08-24 NOTE — Progress Notes (Signed)
  Subjective:    Patient ID: Joseph Gill, male    DOB: 04-28-1951, 61 y.o.   MRN: [id number]  HPI  61 year old patient who has a history of allergic rhinitis. For the past couple weeks he has had intermittent headache congestion and some minor headaches. He is on chronic Claritin as well as Nasonex for his allergic rhinitis condition. He presents today with a chief complaint of decreased auditory acuity on the left. There's been no fever or purulent sinus drainage or productive cough.    Review of Systems  Constitutional: Negative for fever, chills, appetite change and fatigue.  HENT: Positive for hearing loss, congestion and rhinorrhea. Negative for ear pain, sore throat, trouble swallowing, neck stiffness, dental problem, voice change and tinnitus.   Eyes: Negative for pain, discharge and visual disturbance.  Respiratory: Negative for cough, chest tightness, wheezing and stridor.   Cardiovascular: Negative for chest pain, palpitations and leg swelling.  Gastrointestinal: Negative for nausea, vomiting, abdominal pain, diarrhea, constipation, blood in stool and abdominal distention.  Genitourinary: Negative for urgency, hematuria, flank pain, discharge, difficulty urinating and genital sores.  Musculoskeletal: Negative for myalgias, back pain, joint swelling, arthralgias and gait problem.  Skin: Negative for rash.  Neurological: Positive for headaches. Negative for dizziness, syncope, speech difficulty, weakness and numbness.  Hematological: Negative for adenopathy. Does not bruise/bleed easily.  Psychiatric/Behavioral: Negative for behavioral problems and dysphoric mood. The patient is not nervous/anxious.        Objective:   Physical Exam  Constitutional: He is oriented to person, place, and time. He appears well-developed.       Appears clinically well and in no acute distress  HENT:  Head: Normocephalic.  Right Ear: External ear normal.  Left Ear: External ear normal.   Wax in both canals   Eyes: Conjunctivae and EOM are normal.  Neck: Normal range of motion.  Cardiovascular: Normal rate and normal heart sounds.   Pulmonary/Chest: Breath sounds normal.  Abdominal: Bowel sounds are normal.  Musculoskeletal: Normal range of motion. He exhibits no edema and no tenderness.  Neurological: He is alert and oriented to person, place, and time.  Psychiatric: He has a normal mood and affect. His behavior is normal.          Assessment & Plan:   Allergic rhinitis Mild viral URI Bilateral cerumen impactions  Both canals irrigated until clear We'll treat symptomatically with hydro-met Return when necessary Medications refilled including Nasonex  CPX as scheduled

## 2011-09-27 ENCOUNTER — Encounter: Payer: Self-pay | Admitting: Internal Medicine

## 2011-09-27 ENCOUNTER — Ambulatory Visit (INDEPENDENT_AMBULATORY_CARE_PROVIDER_SITE_OTHER): Payer: PRIVATE HEALTH INSURANCE | Admitting: Internal Medicine

## 2011-09-27 VITALS — BP 130/80 | Temp 98.7°F | Wt 166.0 lb

## 2011-09-27 DIAGNOSIS — J309 Allergic rhinitis, unspecified: Secondary | ICD-10-CM

## 2011-09-27 DIAGNOSIS — J069 Acute upper respiratory infection, unspecified: Secondary | ICD-10-CM

## 2011-09-27 MED ORDER — METHYLPREDNISOLONE ACETATE 80 MG/ML IJ SUSP
80.0000 mg | Freq: Once | INTRAMUSCULAR | Status: AC
  Administered 2011-09-27: 80 mg via INTRAMUSCULAR

## 2011-09-27 NOTE — Progress Notes (Signed)
Addended by: Duard Brady I on: 09/27/2011 03:13 PM   Modules accepted: Orders

## 2011-09-27 NOTE — Progress Notes (Signed)
  Subjective:    Patient ID: Joseph Gill, male    DOB: 1951-02-26, 61 y.o.   MRN: [id number]  HPI 61 year old patient who presents with a two-day history of increasing sinus congestion hoarseness mild nonproductive cough. There's been no fever he has had the associated headache. He does have a history of allergic rhinitis. No purulent productive cough chest pain shortness of breath or wheezing      Review of Systems  Constitutional: Negative for fever, chills, appetite change and fatigue.  HENT: Positive for congestion and voice change. Negative for hearing loss, ear pain, sore throat, trouble swallowing, neck stiffness, dental problem and tinnitus.   Eyes: Negative for pain, discharge and visual disturbance.  Respiratory: Positive for cough. Negative for chest tightness, wheezing and stridor.   Cardiovascular: Negative for chest pain, palpitations and leg swelling.  Gastrointestinal: Negative for nausea, vomiting, abdominal pain, diarrhea, constipation, blood in stool and abdominal distention.  Genitourinary: Negative for urgency, hematuria, flank pain, discharge, difficulty urinating and genital sores.  Musculoskeletal: Negative for myalgias, back pain, joint swelling, arthralgias and gait problem.  Skin: Negative for rash.  Neurological: Positive for headaches. Negative for dizziness, syncope, speech difficulty, weakness and numbness.  Hematological: Negative for adenopathy. Does not bruise/bleed easily.  Psychiatric/Behavioral: Negative for behavioral problems and dysphoric mood. The patient is not nervous/anxious.        Objective:   Physical Exam  Constitutional: He is oriented to person, place, and time. He appears well-developed.  HENT:  Head: Normocephalic.  Right Ear: External ear normal.  Left Ear: External ear normal.  Eyes: Conjunctivae and EOM are normal.  Neck: Normal range of motion.  Cardiovascular: Normal rate and normal heart sounds.   Pulmonary/Chest: Breath  sounds normal.  Abdominal: Bowel sounds are normal.  Musculoskeletal: Normal range of motion. He exhibits no edema and no tenderness.  Neurological: He is alert and oriented to person, place, and time.  Psychiatric: He has a normal mood and affect. His behavior is normal.          Assessment & Plan:  Allergic rhinitis/URI. Will treat symptomatically

## 2011-09-27 NOTE — Patient Instructions (Signed)
Use saline irrigation, warm  moist compresses and over-the-counter decongestants only as directed.  Call if there is no improvement in 5 to 7 days, or sooner if you develop increasing pain, fever, or any new symptoms. 

## 2011-09-28 ENCOUNTER — Telehealth: Payer: Self-pay | Admitting: *Deleted

## 2011-09-28 MED ORDER — HYDROCODONE-HOMATROPINE 5-1.5 MG/5ML PO SYRP: 5.0000 mL | ORAL_SOLUTION | Freq: Four times a day (QID) | ORAL | Status: AC | PRN

## 2011-09-28 NOTE — Telephone Encounter (Signed)
Hydromet 6 ounces 1 teaspoon  every 6 hours as needed for cough and congestion 

## 2011-09-28 NOTE — Telephone Encounter (Signed)
OV yesterday URI type sx, he has developed a good cough since OV, requesting Rx cough syrup please Pharmacy on record

## 2011-09-28 NOTE — Telephone Encounter (Signed)
Rx called in, pt informed

## 2011-10-25 ENCOUNTER — Other Ambulatory Visit: Payer: Self-pay | Admitting: Internal Medicine

## 2012-01-24 ENCOUNTER — Other Ambulatory Visit: Payer: Self-pay | Admitting: Internal Medicine

## 2012-01-24 ENCOUNTER — Telehealth: Payer: Self-pay | Admitting: Internal Medicine

## 2012-01-24 MED ORDER — MOMETASONE FUROATE 50 MCG/ACT NA SUSP: 2.0000 | Freq: Every day | NASAL | Status: DC

## 2012-01-24 NOTE — Telephone Encounter (Signed)
Pt on the way to the pharmacy to pick up NASONEX 50 MCG/ACT nasal spray. Pharmacy needs Clarification on BID.Marland KitchenMarland KitchenCan not say as directed

## 2012-01-24 NOTE — Telephone Encounter (Signed)
New rx sent to pharm with different directions

## 2012-03-18 ENCOUNTER — Ambulatory Visit (INDEPENDENT_AMBULATORY_CARE_PROVIDER_SITE_OTHER): Payer: PRIVATE HEALTH INSURANCE

## 2012-03-18 DIAGNOSIS — Z23 Encounter for immunization: Secondary | ICD-10-CM

## 2012-04-22 ENCOUNTER — Other Ambulatory Visit (INDEPENDENT_AMBULATORY_CARE_PROVIDER_SITE_OTHER): Payer: PRIVATE HEALTH INSURANCE

## 2012-04-22 DIAGNOSIS — Z0000 Encounter for general adult medical examination without abnormal findings: Secondary | ICD-10-CM

## 2012-04-22 LAB — CBC WITH DIFFERENTIAL/PLATELET
Basophils Absolute: 0 10*3/uL (ref 0.0–0.1)
Eosinophils Relative: 3.7 % (ref 0.0–5.0)
Lymphocytes Relative: 22.7 % (ref 12.0–46.0)
Monocytes Relative: 9.7 % (ref 3.0–12.0)
Neutrophils Relative %: 63.3 % (ref 43.0–77.0)
Platelets: 320 10*3/uL (ref 150.0–400.0)
RDW: 13.3 % (ref 11.5–14.6)
WBC: 6.7 10*3/uL (ref 4.5–10.5)

## 2012-04-22 LAB — HEPATIC FUNCTION PANEL
ALT: 25 U/L (ref 0–53)
Albumin: 4.2 g/dL (ref 3.5–5.2)
Alkaline Phosphatase: 53 U/L (ref 39–117)
Total Protein: 7 g/dL (ref 6.0–8.3)

## 2012-04-22 LAB — BASIC METABOLIC PANEL
BUN: 12 mg/dL (ref 6–23)
Calcium: 9.5 mg/dL (ref 8.4–10.5)
Creatinine, Ser: 0.9 mg/dL (ref 0.4–1.5)
GFR: 89.06 mL/min (ref >60.00)
Glucose, Bld: 111 mg/dL — ABNORMAL HIGH (ref 70–99)
Potassium: 4.5 mEq/L (ref 3.5–5.1)

## 2012-04-22 LAB — LDL CHOLESTEROL, DIRECT: Direct LDL: 156.8 mg/dL

## 2012-04-22 LAB — POCT URINALYSIS DIPSTICK
Bilirubin, UA: NEGATIVE
Ketones, UA: NEGATIVE
Leukocytes, UA: NEGATIVE
Protein, UA: NEGATIVE
pH, UA: 5.5

## 2012-04-22 LAB — PSA: PSA: 0.95 ng/mL (ref 0.10–4.00)

## 2012-04-22 LAB — LIPID PANEL
HDL: 42.4 mg/dL (ref >39.00)
VLDL: 42.6 mg/dL — ABNORMAL HIGH (ref 0.0–40.0)

## 2012-04-29 ENCOUNTER — Encounter: Payer: Self-pay | Admitting: Internal Medicine

## 2012-04-29 ENCOUNTER — Ambulatory Visit (INDEPENDENT_AMBULATORY_CARE_PROVIDER_SITE_OTHER): Payer: PRIVATE HEALTH INSURANCE | Admitting: Internal Medicine

## 2012-04-29 VITALS — BP 110/80 | HR 84 | Temp 98.2°F | Resp 18 | Ht 70.75 in | Wt 169.0 lb

## 2012-04-29 DIAGNOSIS — G4733 Obstructive sleep apnea (adult) (pediatric): Secondary | ICD-10-CM

## 2012-04-29 DIAGNOSIS — E785 Hyperlipidemia, unspecified: Secondary | ICD-10-CM | POA: Insufficient documentation

## 2012-04-29 DIAGNOSIS — Z0000 Encounter for general adult medical examination without abnormal findings: Secondary | ICD-10-CM

## 2012-04-29 DIAGNOSIS — J309 Allergic rhinitis, unspecified: Secondary | ICD-10-CM

## 2012-04-29 MED ORDER — ATORVASTATIN CALCIUM 40 MG PO TABS: 40.0000 mg | ORAL_TABLET | Freq: Every day | ORAL | Status: DC

## 2012-04-29 MED ORDER — MONTELUKAST SODIUM 10 MG PO TABS: 10.0000 mg | ORAL_TABLET | Freq: Every day | ORAL | Status: DC

## 2012-04-29 NOTE — Progress Notes (Signed)
Subjective:    Patient ID: Joseph Gill, male    DOB: Aug 10, 1950, 61 y.o.   MRN: [id number]  HPI  61 year old patient who is seen today for a wellness exam. Medical problem include dyslipidemia and allergic rhinitis. He is doing quite well. He does describe frequent episodes of late afternoon sleepiness. His son has OSA. His main complaint of significant allergy related symptoms  Past Medical History  Diagnosis Date  . ALLERGIC RHINITIS 03/26/2007  . 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 MIGRAINE HEADACHE 11/03/2008    History   Social History  . Marital Status: Married    Spouse Name: N/A    Number of Children: N/A  . Years of Education: N/A   Occupational History  . Not on file.   Social History Main Topics  . Smoking status: Never Smoker   . Smokeless tobacco: Never Used  . Alcohol Use: Yes  . Drug Use: No  . Sexually Active: Not on file   Other Topics Concern  . Not on file   Social History Narrative  . No narrative on file    Past Surgical History  Procedure Date  . Tonsillectomy   . Nasal sinus surgery     No family history on file.  Allergies  Allergen Reactions  . Erythromycin     Current Outpatient Prescriptions on File Prior to Visit  Medication Sig Dispense Refill  . amitriptyline (ELAVIL) 25 MG tablet Take 1 tablet (25 mg total) by mouth at bedtime.  90 tablet  4  . eletriptan (RELPAX) 20 MG tablet One tablet by mouth as needed for migraine headache.  If the headache improves and then returns, dose may be repeated after 2 hours have elapsed since first dose (do not exceed 80 mg per day). may repeat in 2 hours if necessary       . loratadine (CLARITIN) 10 MG tablet Take 10 mg by mouth daily.        . mometasone (NASONEX) 50 MCG/ACT nasal spray Place 2 sprays into the nose daily.  17 g  2  . niacin (NIASPAN) 500 MG CR tablet Take 500 mg by mouth daily.        Marland Kitchen testosterone (ANDROGEL) 50 MG/5GM GEL Place 5 g onto the skin daily.  30 Tube   4  . traMADol (ULTRAM) 50 MG tablet Take 1 tablet (50 mg total) by mouth every 6 (six) hours as needed for pain.  60 tablet  4  . vardenafil (LEVITRA) 10 MG tablet Take 1 tablet (10 mg total) by mouth daily as needed. As directed  10 tablet  4    BP 110/80  Pulse 84  Temp 98.2 F (36.8 C) (Oral)  Resp 18  Ht 5' 10.75" (1.797 m)  Wt 169 lb (76.658 kg)  BMI 23.74 kg/m2  SpO2 97%       Review of Systems  Constitutional: Negative for fever, chills, activity change, appetite change and fatigue.  HENT: Positive for congestion and rhinorrhea. Negative for hearing loss, ear pain, sneezing, mouth sores, trouble swallowing, neck pain, neck stiffness, dental problem, voice change, sinus pressure and tinnitus.   Eyes: Negative for photophobia, pain, redness and visual disturbance.  Respiratory: Negative for apnea, cough, choking, chest tightness, shortness of breath and wheezing.   Cardiovascular: Negative for chest pain, palpitations and leg swelling.  Gastrointestinal: Negative for nausea, vomiting, abdominal pain, diarrhea, constipation, blood in stool, abdominal distention, anal bleeding and rectal pain.  Genitourinary: Negative for dysuria, urgency, frequency,  hematuria, flank pain, decreased urine volume, discharge, penile swelling, scrotal swelling, difficulty urinating, genital sores and testicular pain.  Musculoskeletal: Negative for myalgias, back pain, joint swelling, arthralgias and gait problem.  Skin: Negative for color change, rash and wound.  Neurological: Negative for dizziness, tremors, seizures, syncope, facial asymmetry, speech difficulty, weakness, light-headedness, numbness and headaches.  Hematological: Negative for adenopathy. Does not bruise/bleed easily.  Psychiatric/Behavioral: Positive for sleep disturbance (Heavy snorer). Negative for suicidal ideas, hallucinations, behavioral problems, confusion, self-injury, dysphoric mood, decreased concentration and agitation. The  patient is not nervous/anxious.        Objective:   Physical Exam  Constitutional: He appears well-developed and well-nourished.  HENT:  Head: Normocephalic and atraumatic.  Right Ear: External ear normal.  Left Ear: External ear normal.  Nose: Nose normal.  Mouth/Throat: Oropharynx is clear and moist.       Low hanging soft palate with pharyngeal crowding  Eyes: Conjunctivae normal and EOM are normal. Pupils are equal, round, and reactive to light. No scleral icterus.  Neck: Normal range of motion. Neck supple. No JVD present. No thyromegaly present.  Cardiovascular: Regular rhythm, normal heart sounds and intact distal pulses.  Exam reveals no gallop and no friction rub.   No murmur heard. Pulmonary/Chest: Effort normal and breath sounds normal. He exhibits no tenderness.  Abdominal: Soft. Bowel sounds are normal. He exhibits no distension and no mass. There is no tenderness.  Genitourinary: Prostate normal and penis normal.  Musculoskeletal: Normal range of motion. He exhibits no edema and no tenderness.  Lymphadenopathy:    He has no cervical adenopathy.  Neurological: He is alert. He has normal reflexes. No cranial nerve deficit. Coordination normal.  Skin: Skin is warm and dry. No rash noted.  Psychiatric: He has a normal mood and affect. His behavior is normal.          Assessment & Plan:   Preventive health examination Allergic rhinitis. Will give a trial of Singulair Hypercholesterolemia. Niaspan will be discontinued he is had frequent flushing will place on atorvastatin 40 History of heavy snoring daytime sleepiness. Pharyngeal crowding on clinical exam. Rule out OSA  Home sleep study will be ordered

## 2012-04-29 NOTE — Patient Instructions (Signed)
It is important that you exercise regularly, at least 20 minutes 3 to 4 times per week.  If you develop chest pain or shortness of breath seek  medical attention.  Sleep study as discussed

## 2012-04-30 ENCOUNTER — Other Ambulatory Visit: Payer: Self-pay

## 2012-04-30 MED ORDER — TESTOSTERONE 50 MG/5GM (1%) TD GEL: 5.0000 g | Freq: Every day | TRANSDERMAL | Status: DC

## 2012-05-09 ENCOUNTER — Ambulatory Visit (INDEPENDENT_AMBULATORY_CARE_PROVIDER_SITE_OTHER): Payer: PRIVATE HEALTH INSURANCE | Admitting: Pulmonary Disease

## 2012-05-09 DIAGNOSIS — G4733 Obstructive sleep apnea (adult) (pediatric): Secondary | ICD-10-CM

## 2012-07-28 ENCOUNTER — Other Ambulatory Visit: Payer: Self-pay | Admitting: Internal Medicine

## 2012-07-31 ENCOUNTER — Other Ambulatory Visit: Payer: Self-pay | Admitting: Internal Medicine

## 2012-10-01 ENCOUNTER — Other Ambulatory Visit: Payer: Self-pay | Admitting: Internal Medicine

## 2012-10-06 ENCOUNTER — Other Ambulatory Visit: Payer: Self-pay | Admitting: Internal Medicine

## 2012-11-07 ENCOUNTER — Encounter: Payer: Self-pay | Admitting: Internal Medicine

## 2012-11-07 ENCOUNTER — Ambulatory Visit (INDEPENDENT_AMBULATORY_CARE_PROVIDER_SITE_OTHER): Payer: PRIVATE HEALTH INSURANCE | Admitting: Internal Medicine

## 2012-11-07 VITALS — BP 120/80 | HR 78 | Temp 98.7°F | Resp 20 | Wt 174.0 lb

## 2012-11-07 DIAGNOSIS — J069 Acute upper respiratory infection, unspecified: Secondary | ICD-10-CM

## 2012-11-07 DIAGNOSIS — J309 Allergic rhinitis, unspecified: Secondary | ICD-10-CM

## 2012-11-07 MED ORDER — TRAMADOL HCL 50 MG PO TABS: ORAL_TABLET | ORAL | Status: DC

## 2012-11-07 MED ORDER — MOMETASONE FUROATE 50 MCG/ACT NA SUSP: NASAL | Status: DC

## 2012-11-07 MED ORDER — HYDROCODONE-HOMATROPINE 5-1.5 MG/5ML PO SYRP: 5.0000 mL | ORAL_SOLUTION | Freq: Four times a day (QID) | ORAL | Status: DC | PRN

## 2012-11-07 NOTE — Progress Notes (Signed)
Subjective:    Patient ID: Joseph Gill, male    DOB: July 27, 1950, 62 y.o.   MRN: [id number]  HPI  62 year old patient who has a history of allergic rhinitis. He presents with a two-day history of cough laryngitis achiness and a general sense of unwellness. Manus medications include Claritin and Singulair as well as Nasonex. He does have tramadol for pain.  Past Medical History  Diagnosis Date  . ALLERGIC RHINITIS 03/26/2007  . 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 MIGRAINE HEADACHE 11/03/2008    History   Social History  . Marital Status: Married    Spouse Name: N/A    Number of Children: N/A  . Years of Education: N/A   Occupational History  . Not on file.   Social History Main Topics  . Smoking status: Never Smoker   . Smokeless tobacco: Never Used  . Alcohol Use: Yes  . Drug Use: No  . Sexually Active: Not on file   Other Topics Concern  . Not on file   Social History Narrative  . No narrative on file    Past Surgical History  Procedure Laterality Date  . Tonsillectomy    . Nasal sinus surgery      No family history on file.  Allergies  Allergen Reactions  . Erythromycin     Current Outpatient Prescriptions on File Prior to Visit  Medication Sig Dispense Refill  . amitriptyline (ELAVIL) 25 MG tablet Take 1 tablet (25 mg total) by mouth at bedtime.  90 tablet  4  . atorvastatin (LIPITOR) 40 MG tablet Take 1 tablet (40 mg total) by mouth daily.  90 tablet  3  . loratadine (CLARITIN) 10 MG tablet Take 10 mg by mouth daily.        . montelukast (SINGULAIR) 10 MG tablet Take 1 tablet (10 mg total) by mouth at bedtime.  30 tablet  3  . NASONEX 50 MCG/ACT nasal spray PLACE 2 SPRAYS INTO THE NOSE  DAILY.  17 g  2  . RELPAX 20 MG tablet TAKE ONE TABLET BY MOUTH AS NEEDED FOR MIGRAINE MAY REPEAT 1 TABLETAFTER 4 HRS  12 tablet  2  . testosterone (ANDROGEL) 50 MG/5GM GEL Place 5 g onto the skin daily.  30 Package  5  . traMADol (ULTRAM) 50 MG tablet TAKE  ONE TABLET BY MOUTH EVERY SIX HOURS AS NEEDED FOR PAIN  60 tablet  3  . vardenafil (LEVITRA) 10 MG tablet Take 1 tablet (10 mg total) by mouth daily as needed. As directed  10 tablet  4   No current facility-administered medications on file prior to visit.    BP 120/80  Pulse 78  Temp(Src) 98.7 F (37.1 C) (Oral)  Resp 20  Wt 174 lb (78.926 kg)  BMI 24.44 kg/m2  SpO2 98%       Review of Systems  Constitutional: Negative for fever, chills, appetite change and fatigue.  HENT: Positive for congestion, sore throat, rhinorrhea and postnasal drip. Negative for hearing loss, ear pain, trouble swallowing, neck stiffness, dental problem, voice change and tinnitus.   Eyes: Negative for pain, discharge and visual disturbance.  Respiratory: Positive for cough. Negative for chest tightness, wheezing and stridor.   Cardiovascular: Negative for chest pain, palpitations and leg swelling.  Gastrointestinal: Negative for nausea, vomiting, abdominal pain, diarrhea, constipation, blood in stool and abdominal distention.  Genitourinary: Negative for urgency, hematuria, flank pain, discharge, difficulty urinating and genital sores.  Musculoskeletal: Negative for myalgias, back pain, joint swelling,  arthralgias and gait problem.  Skin: Negative for rash.  Neurological: Negative for dizziness, syncope, speech difficulty, weakness, numbness and headaches.  Hematological: Negative for adenopathy. Does not bruise/bleed easily.  Psychiatric/Behavioral: Negative for behavioral problems and dysphoric mood. The patient is not nervous/anxious.        Objective:   Physical Exam  Constitutional: He is oriented to person, place, and time. He appears well-developed.  HENT:  Head: Normocephalic.  Right Ear: External ear normal.  Left Ear: External ear normal.  Oropharynx mildly injected  Cerumen in both canals  Eyes: Conjunctivae and EOM are normal.  Neck: Normal range of motion.  Cardiovascular: Normal  rate and normal heart sounds.   Pulmonary/Chest: Breath sounds normal.  Abdominal: Bowel sounds are normal.  Musculoskeletal: Normal range of motion. He exhibits no edema and no tenderness.  Lymphadenopathy:    He has no cervical adenopathy.  Neurological: He is alert and oriented to person, place, and time.  Psychiatric: He has a normal mood and affect. His behavior is normal.          Assessment & Plan:   Viral URI with cough. We'll treat symptomatically Allergic rhinitis

## 2012-11-07 NOTE — Patient Instructions (Signed)
Acute bronchitis symptoms for less than 10 days are generally not helped by antibiotics.  Take over-the-counter expectorants and cough medications such as  Mucinex DM.  Call if there is no improvement in 5 to 7 days or if he developed worsening cough, fever, or new symptoms, such as shortness of breath or chest pain.    

## 2012-11-14 ENCOUNTER — Telehealth: Payer: Self-pay | Admitting: Internal Medicine

## 2012-11-14 MED ORDER — HYDROCODONE-HOMATROPINE 5-1.5 MG/5ML PO SYRP: 5.0000 mL | ORAL_SOLUTION | Freq: Four times a day (QID) | ORAL | Status: AC | PRN

## 2012-11-14 NOTE — Telephone Encounter (Signed)
Pt would like refill of HYDROcodone-homatropine (HYCODAN) 5-1.5 MG/5ML syrup Pharm: [facility]/  [address]

## 2012-11-14 NOTE — Telephone Encounter (Signed)
Pt notified refill for Hycodan cough syrup called into pharmacy.

## 2012-12-18 ENCOUNTER — Other Ambulatory Visit: Payer: Self-pay | Admitting: *Deleted

## 2012-12-18 MED ORDER — TESTOSTERONE 50 MG/5GM (1%) TD GEL: 5.0000 g | Freq: Every day | TRANSDERMAL | Status: DC

## 2012-12-18 NOTE — Telephone Encounter (Signed)
Rx refill faxed to pharmacy.

## 2013-05-12 ENCOUNTER — Other Ambulatory Visit: Payer: Self-pay | Admitting: Internal Medicine

## 2013-06-04 ENCOUNTER — Other Ambulatory Visit: Payer: Self-pay | Admitting: Internal Medicine

## 2013-06-25 HISTORY — PX: CATARACT EXTRACTION, BILATERAL: SHX1313

## 2013-06-28 ENCOUNTER — Other Ambulatory Visit: Payer: Self-pay | Admitting: Internal Medicine

## 2013-07-16 ENCOUNTER — Other Ambulatory Visit: Payer: Self-pay | Admitting: Internal Medicine

## 2013-09-20 ENCOUNTER — Other Ambulatory Visit: Payer: Self-pay | Admitting: Internal Medicine

## 2013-11-24 ENCOUNTER — Other Ambulatory Visit: Payer: Self-pay | Admitting: Internal Medicine

## 2013-12-18 ENCOUNTER — Other Ambulatory Visit: Payer: Self-pay | Admitting: Internal Medicine

## 2013-12-23 ENCOUNTER — Other Ambulatory Visit: Payer: Self-pay | Admitting: Internal Medicine

## 2014-01-19 ENCOUNTER — Other Ambulatory Visit (INDEPENDENT_AMBULATORY_CARE_PROVIDER_SITE_OTHER): Payer: No Typology Code available for payment source

## 2014-01-19 DIAGNOSIS — Z0000 Encounter for general adult medical examination without abnormal findings: Secondary | ICD-10-CM

## 2014-01-19 LAB — LIPID PANEL
CHOL/HDL RATIO: 5
CHOLESTEROL: 214 mg/dL — AB (ref 0–200)
HDL: 42.7 mg/dL (ref >39.00)
NonHDL: 171.3
Triglycerides: 347 mg/dL — ABNORMAL HIGH (ref 0.0–149.0)
VLDL: 69.4 mg/dL — AB (ref 0.0–40.0)

## 2014-01-19 LAB — CBC WITH DIFFERENTIAL/PLATELET
Basophils Absolute: 0 10*3/uL (ref 0.0–0.1)
Basophils Relative: 0.2 % (ref 0.0–3.0)
Eosinophils Absolute: 0.3 10*3/uL (ref 0.0–0.7)
Eosinophils Relative: 4.1 % (ref 0.0–5.0)
HCT: 42.6 % (ref 39.0–52.0)
Hemoglobin: 14.2 g/dL (ref 13.0–17.0)
LYMPHS PCT: 18.4 % (ref 12.0–46.0)
Lymphs Abs: 1.4 10*3/uL (ref 0.7–4.0)
MCHC: 33.3 g/dL (ref 30.0–36.0)
MCV: 94.9 fl (ref 78.0–100.0)
MONOS PCT: 8.9 % (ref 3.0–12.0)
Monocytes Absolute: 0.7 10*3/uL (ref 0.1–1.0)
NEUTROS PCT: 68.4 % (ref 43.0–77.0)
Neutro Abs: 5.3 10*3/uL (ref 1.4–7.7)
PLATELETS: 302 10*3/uL (ref 150.0–400.0)
RBC: 4.49 Mil/uL (ref 4.22–5.81)
RDW: 13.4 % (ref 11.5–15.5)
WBC: 7.7 10*3/uL (ref 4.0–10.5)

## 2014-01-19 LAB — POCT URINALYSIS DIPSTICK
Bilirubin, UA: NEGATIVE
Glucose, UA: NEGATIVE
KETONES UA: NEGATIVE
Leukocytes, UA: NEGATIVE
Nitrite, UA: NEGATIVE
PH UA: 6
Protein, UA: NEGATIVE
SPEC GRAV UA: 1.01
UROBILINOGEN UA: 0.2

## 2014-01-19 LAB — LDL CHOLESTEROL, DIRECT: Direct LDL: 102.5 mg/dL

## 2014-01-19 LAB — HEPATIC FUNCTION PANEL
ALK PHOS: 70 U/L (ref 39–117)
ALT: 24 U/L (ref 0–53)
AST: 21 U/L (ref 0–37)
Albumin: 4.4 g/dL (ref 3.5–5.2)
BILIRUBIN TOTAL: 0.6 mg/dL (ref 0.2–1.2)
Bilirubin, Direct: 0.1 mg/dL (ref 0.0–0.3)
Total Protein: 7.2 g/dL (ref 6.0–8.3)

## 2014-01-19 LAB — BASIC METABOLIC PANEL
BUN: 16 mg/dL (ref 6–23)
CO2: 28 mEq/L (ref 19–32)
Calcium: 9.3 mg/dL (ref 8.4–10.5)
Chloride: 103 mEq/L (ref 96–112)
Creatinine, Ser: 0.9 mg/dL (ref 0.4–1.5)
GFR: 87.45 mL/min (ref >60.00)
GLUCOSE: 95 mg/dL (ref 70–99)
Potassium: 4.3 mEq/L (ref 3.5–5.1)
Sodium: 137 mEq/L (ref 135–145)

## 2014-01-19 LAB — PSA: PSA: 1.23 ng/mL (ref 0.10–4.00)

## 2014-01-19 LAB — TSH: TSH: 1.89 u[IU]/mL (ref 0.35–4.50)

## 2014-01-26 ENCOUNTER — Encounter: Payer: Self-pay | Admitting: Internal Medicine

## 2014-01-26 ENCOUNTER — Ambulatory Visit (INDEPENDENT_AMBULATORY_CARE_PROVIDER_SITE_OTHER): Payer: No Typology Code available for payment source | Admitting: Internal Medicine

## 2014-01-26 VITALS — BP 128/80 | HR 77 | Temp 98.4°F | Resp 20 | Ht 70.5 in | Wt 171.0 lb

## 2014-01-26 DIAGNOSIS — N529 Male erectile dysfunction, unspecified: Secondary | ICD-10-CM

## 2014-01-26 DIAGNOSIS — E349 Endocrine disorder, unspecified: Secondary | ICD-10-CM | POA: Insufficient documentation

## 2014-01-26 DIAGNOSIS — Z23 Encounter for immunization: Secondary | ICD-10-CM

## 2014-01-26 DIAGNOSIS — E785 Hyperlipidemia, unspecified: Secondary | ICD-10-CM

## 2014-01-26 DIAGNOSIS — J309 Allergic rhinitis, unspecified: Secondary | ICD-10-CM

## 2014-01-26 DIAGNOSIS — E291 Testicular hypofunction: Secondary | ICD-10-CM

## 2014-01-26 DIAGNOSIS — G43909 Migraine, unspecified, not intractable, without status migrainosus: Secondary | ICD-10-CM

## 2014-01-26 MED ORDER — TRAMADOL HCL 50 MG PO TABS: ORAL_TABLET | ORAL | Status: DC

## 2014-01-26 MED ORDER — SUMATRIPTAN SUCCINATE 100 MG PO TABS
100.0000 mg | ORAL_TABLET | ORAL | Status: DC | PRN
Start: 2014-01-26 — End: 2015-02-15

## 2014-01-26 MED ORDER — TESTOSTERONE 30 MG/ACT TD SOLN: TRANSDERMAL | Status: DC

## 2014-01-26 NOTE — Progress Notes (Signed)
Pre visit review using our clinic review tool, if applicable. No additional management support is needed unless otherwise documented below in the visit note. 

## 2014-01-26 NOTE — Patient Instructions (Addendum)
It is important that you exercise regularly, at least 20 minutes 3 to 4 times per week.  If you develop chest pain or shortness of breath seek  medical attention.  Health Maintenance A healthy lifestyle and preventative care can promote health and wellness.  Maintain regular health, dental, and eye exams.  Eat a healthy diet. Foods like vegetables, fruits, whole grains, low-fat dairy products, and lean protein foods contain the nutrients you need and are low in calories. Decrease your intake of foods high in solid fats, added sugars, and salt. Get information about a proper diet from your health care provider, if necessary.  Regular physical exercise is one of the most important things you can do for your health. Most adults should get at least 150 minutes of moderate-intensity exercise (any activity that increases your heart rate and causes you to sweat) each week. In addition, most adults need muscle-strengthening exercises on 2 or more days a week.   Maintain a healthy weight. The body mass index (BMI) is a screening tool to identify possible weight problems. It provides an estimate of body fat based on height and weight. Your health care provider can find your BMI and can help you achieve or maintain a healthy weight. For males 20 years and older:  A BMI below 18.5 is considered underweight.  A BMI of 18.5 to 24.9 is normal.  A BMI of 25 to 29.9 is considered overweight.  A BMI of 30 and above is considered obese.  Maintain normal blood lipids and cholesterol by exercising and minimizing your intake of saturated fat. Eat a balanced diet with plenty of fruits and vegetables. Blood tests for lipids and cholesterol should begin at age 20 and be repeated every 5 years. If your lipid or cholesterol levels are high, you are over age 50, or you are at high risk for heart disease, you may need your cholesterol levels checked more frequently.Ongoing high lipid and cholesterol levels should be  treated with medicines if diet and exercise are not working.  If you smoke, find out from your health care provider how to quit. If you do not use tobacco, do not start.  Lung cancer screening is recommended for adults aged 55-80 years who are at high risk for developing lung cancer because of a history of smoking. A yearly low-dose CT scan of the lungs is recommended for people who have at least a 30-pack-year history of smoking and are current smokers or have quit within the past 15 years. A pack year of smoking is smoking an average of 1 pack of cigarettes a day for 1 year (for example, a 30-pack-year history of smoking could mean smoking 1 pack a day for 30 years or 2 packs a day for 15 years). Yearly screening should continue until the smoker has stopped smoking for at least 15 years. Yearly screening should be stopped for people who develop a health problem that would prevent them from having lung cancer treatment.  If you choose to drink alcohol, do not have more than 2 drinks per day. One drink is considered to be 12 oz (360 mL) of beer, 5 oz (150 mL) of wine, or 1.5 oz (45 mL) of liquor.  Avoid the use of street drugs. Do not share needles with anyone. Ask for help if you need support or instructions about stopping the use of drugs.  High blood pressure causes heart disease and increases the risk of stroke. Blood pressure should be checked at least every   1-2 years. Ongoing high blood pressure should be treated with medicines if weight loss and exercise are not effective.  If you are 19-73 years old, ask your health care provider if you should take aspirin to prevent heart disease.  Diabetes screening involves taking a blood sample to check your fasting blood sugar level. This should be done once every 3 years after age 40 if you are at a normal weight and without risk factors for diabetes. Testing should be considered at a younger age or be carried out more frequently if you are overweight and  have at least 1 risk factor for diabetes.  Colorectal cancer can be detected and often prevented. Most routine colorectal cancer screening begins at the age of 42 and continues through age 2. However, your health care provider may recommend screening at an earlier age if you have risk factors for colon cancer. On a yearly basis, your health care provider may provide home test kits to check for hidden blood in the stool. A small camera at the end of a tube may be used to directly examine the colon (sigmoidoscopy or colonoscopy) to detect the earliest forms of colorectal cancer. Talk to your health care provider about this at age 47 when routine screening begins. A direct exam of the colon should be repeated every 5-10 years through age 56, unless early forms of precancerous polyps or small growths are found.  People who are at an increased risk for hepatitis B should be screened for this virus. You are considered at high risk for hepatitis B if:  You were born in a country where hepatitis B occurs often. Talk with your health care provider about which countries are considered high risk.  Your parents were born in a high-risk country and you have not received a shot to protect against hepatitis B (hepatitis B vaccine).  You have HIV or AIDS.  You use needles to inject street drugs.  You live with, or have sex with, someone who has hepatitis B.  You are a man who has sex with other men (MSM).  You get hemodialysis treatment.  You take certain medicines for conditions like cancer, organ transplantation, and autoimmune conditions.  Hepatitis C blood testing is recommended for all people born from [birth year] through 1964 and any individual with known risk factors for hepatitis C.  Healthy men should no longer receive prostate-specific antigen (PSA) blood tests as part of routine cancer screening. Talk to your health care provider about prostate cancer screening.  Testicular cancer screening is not  recommended for adolescents or adult males who have no symptoms. Screening includes self-exam, a health care provider exam, and other screening tests. Consult with your health care provider about any symptoms you have or any concerns you have about testicular cancer.  Practice safe sex. Use condoms and avoid high-risk sexual practices to reduce the spread of sexually transmitted infections (STIs).  You should be screened for STIs, including gonorrhea and chlamydia if:  You are sexually active and are younger than 24 years.  You are older than 24 years, and your health care provider tells you that you are at risk for this type of infection.  Your sexual activity has changed since you were last screened, and you are at an increased risk for chlamydia or gonorrhea. Ask your health care provider if you are at risk.  If you are at risk of being infected with HIV, it is recommended that you take a prescription medicine daily to prevent HIV  infection. This is called pre-exposure prophylaxis (PrEP). You are considered at risk if:  You are a man who has sex with other men (MSM).  You are a heterosexual man who is sexually active with multiple partners.  You take drugs by injection.  You are sexually active with a partner who has HIV.  Talk with your health care provider about whether you are at high risk of being infected with HIV. If you choose to begin PrEP, you should first be tested for HIV. You should then be tested every 3 months for as long as you are taking PrEP.  Use sunscreen. Apply sunscreen liberally and repeatedly throughout the day. You should seek shade when your shadow is shorter than you. Protect yourself by wearing long sleeves, pants, a wide-brimmed hat, and sunglasses year round whenever you are outdoors.  Tell your health care provider of new moles or changes in moles, especially if there is a change in shape or color. Also, tell your health care provider if a mole is larger  than the size of a pencil eraser.  A one-time screening for abdominal aortic aneurysm (AAA) and surgical repair of large AAAs by ultrasound is recommended for men aged 32-75 years who are current or former smokers.  Stay current with your vaccines (immunizations). Document Released: 12/08/2007 Document Revised: 06/16/2013 Document Reviewed: 11/06/2010 [facility] Patient Information 2015 [place], [place]. This information is not intended to replace advice given to you by your health care provider. Make sure you discuss any questions you have with your health care provider.  Schedule your colonoscopy to help detect colon cancer.

## 2014-01-26 NOTE — Progress Notes (Signed)
Subjective:    Patient ID: Joseph Gill, male    DOB: October 08, 1950, 63 y.o.   MRN: [id number]  HPI  63 year old patient who is in today for a preventive health examination Medical problems include dyslipidemia.  Lipid profile reviewed.  He states he takes Lipitor only 50% of the day is due to concerns about constipation and muscle fatigue.  He does have a history of hypogonadism, but presently is not on testosterone replacement medication  Past Medical History  Diagnosis Date  . ALLERGIC RHINITIS 03/26/2007  . 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 MIGRAINE HEADACHE 11/03/2008    History   Social History  . Marital Status: Married    Spouse Name: N/A    Number of Children: N/A  . Years of Education: N/A   Occupational History  . Not on file.   Social History Main Topics  . Smoking status: Never Smoker   . Smokeless tobacco: Never Used  . Alcohol Use: Yes  . Drug Use: No  . Sexual Activity: Not on file   Other Topics Concern  . Not on file   Social History Narrative  . No narrative on file    Past Surgical History  Procedure Laterality Date  . Tonsillectomy    . Nasal sinus surgery      No family history on file.  Allergies  Allergen Reactions  . Erythromycin     Current Outpatient Prescriptions on File Prior to Visit  Medication Sig Dispense Refill  . amitriptyline (ELAVIL) 25 MG tablet Take one tablet by mouth at bedtime  90 tablet  1  . atorvastatin (LIPITOR) 40 MG tablet Take one tablet by mouth one time daily  90 tablet  1  . loratadine (CLARITIN) 10 MG tablet Take 10 mg by mouth daily.        . mometasone (NASONEX) 50 MCG/ACT nasal spray PLACE 2 SPRAYS INTO THE NOSE  DAILY.  17 g  2  . montelukast (SINGULAIR) 10 MG tablet Take 1 tablet (10 mg total) by mouth at bedtime.  30 tablet  3  . vardenafil (LEVITRA) 10 MG tablet Take 1 tablet (10 mg total) by mouth daily as needed. As directed  10 tablet  4   No current facility-administered  medications on file prior to visit.    BP 128/80  Pulse 77  Temp(Src) 98.4 F (36.9 C) (Oral)  Resp 20  Ht 5' 10.5" (1.791 m)  Wt 171 lb (77.565 kg)  BMI 24.18 kg/m2  SpO2 98%     Review of Systems  Constitutional: Positive for fatigue. Negative for fever, chills and appetite change.  HENT: Negative for congestion, dental problem, ear pain, hearing loss, sore throat, tinnitus, trouble swallowing and voice change.   Eyes: Negative for pain, discharge and visual disturbance.  Respiratory: Negative for cough, chest tightness, wheezing and stridor.   Cardiovascular: Negative for chest pain, palpitations and leg swelling.  Gastrointestinal: Positive for constipation. Negative for nausea, vomiting, abdominal pain, diarrhea, blood in stool and abdominal distention.  Genitourinary: Negative for urgency, hematuria, flank pain, discharge, difficulty urinating and genital sores.  Musculoskeletal: Positive for myalgias. Negative for arthralgias, back pain, gait problem, joint swelling and neck stiffness.  Skin: Negative for rash.  Neurological: Negative for dizziness, syncope, speech difficulty, weakness, numbness and headaches.  Hematological: Negative for adenopathy. Does not bruise/bleed easily.  Psychiatric/Behavioral: Negative for behavioral problems and dysphoric mood. The patient is not nervous/anxious.        Objective:   Physical  Exam  Constitutional: He appears well-developed and well-nourished.  HENT:  Head: Normocephalic and atraumatic.  Right Ear: External ear normal.  Left Ear: External ear normal.  Nose: Nose normal.  Mouth/Throat: Oropharynx is clear and moist.  Eyes: Conjunctivae and EOM are normal. Pupils are equal, round, and reactive to light. No scleral icterus.  Neck: Normal range of motion. Neck supple. No JVD present. No thyromegaly present.  Cardiovascular: Regular rhythm, normal heart sounds and intact distal pulses.  Exam reveals no gallop and no friction  rub.   No murmur heard. Pulmonary/Chest: Effort normal and breath sounds normal. He exhibits no tenderness.  Abdominal: Soft. Bowel sounds are normal. He exhibits no distension and no mass. There is no tenderness.  Genitourinary: Prostate normal and penis normal. Guaiac negative stool.  Musculoskeletal: Normal range of motion. He exhibits no edema and no tenderness.  Lymphadenopathy:    He has no cervical adenopathy.  Neurological: He is alert. He has normal reflexes. No cranial nerve deficit. Coordination normal.  Skin: Skin is warm and dry. No rash noted.  Psychiatric: He has a normal mood and affect. His behavior is normal.          Assessment & Plan:   Preventive health examination  Colonoscopy recommended.  It has been 10 years since his initial screen Allergic rhinitis, stable Dyslipidemia.  Continue Lipitor Migraine headaches, stable  Recheck 6 months or as needed

## 2014-04-01 ENCOUNTER — Other Ambulatory Visit: Payer: Self-pay | Admitting: Internal Medicine

## 2014-04-01 NOTE — Telephone Encounter (Signed)
rx called in

## 2014-04-01 NOTE — Telephone Encounter (Signed)
ok 

## 2014-04-28 ENCOUNTER — Other Ambulatory Visit: Payer: Self-pay | Admitting: Internal Medicine

## 2014-05-16 ENCOUNTER — Other Ambulatory Visit: Payer: Self-pay | Admitting: Internal Medicine

## 2014-07-12 ENCOUNTER — Ambulatory Visit (INDEPENDENT_AMBULATORY_CARE_PROVIDER_SITE_OTHER): Payer: No Typology Code available for payment source | Admitting: Family Medicine

## 2014-07-12 ENCOUNTER — Encounter: Payer: Self-pay | Admitting: Family Medicine

## 2014-07-12 VITALS — BP 110/78 | Temp 98.7°F | Wt 170.9 lb

## 2014-07-12 DIAGNOSIS — H6123 Impacted cerumen, bilateral: Secondary | ICD-10-CM

## 2014-07-12 NOTE — Progress Notes (Signed)
Pre visit review using our clinic review tool, if applicable. No additional management support is needed unless otherwise documented below in the visit note. 

## 2014-07-12 NOTE — Progress Notes (Signed)
  HPI:  Acute visit for:  "Clogged Ears" -reports chronic issues with cerumen -worsening over last month - muffled hearing, clogged ears -denies: pain, fevers, drainage from ears  ROS: See pertinent positives and negatives per HPI.  Past Medical History  Diagnosis Date  . ALLERGIC RHINITIS 03/26/2007  . 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 MIGRAINE HEADACHE 11/03/2008    Past Surgical History  Procedure Laterality Date  . Tonsillectomy    . Nasal sinus surgery      No family history on file.  History   Social History  . Marital Status: Married    Spouse Name: N/A    Number of Children: N/A  . Years of Education: N/A   Social History Main Topics  . Smoking status: Never Smoker   . Smokeless tobacco: Never Used  . Alcohol Use: Yes  . Drug Use: No  . Sexual Activity: None   Other Topics Concern  . None   Social History Narrative     Current outpatient prescriptions:  .  amitriptyline (ELAVIL) 25 MG tablet, TAKE 1 TABLET BY MOUTH AT BEDTIME, Disp: 90 tablet, Rfl: 1 .  atorvastatin (LIPITOR) 40 MG tablet, TAKE ONE TABLET BY MOUTH ONE TIME DAILY , Disp: 90 tablet, Rfl: 3 .  loratadine (CLARITIN) 10 MG tablet, Take 10 mg by mouth daily.  , Disp: , Rfl:  .  mometasone (NASONEX) 50 MCG/ACT nasal spray, PLACE 2 SPRAYS INTO THE NOSE  DAILY., Disp: 17 g, Rfl: 2 .  montelukast (SINGULAIR) 10 MG tablet, Take 1 tablet (10 mg total) by mouth at bedtime., Disp: 30 tablet, Rfl: 3 .  SUMAtriptan (IMITREX) 100 MG tablet, Take 1 tablet (100 mg total) by mouth every 2 (two) hours as needed for migraine or headache. May repeat in 2 hours if headache persists or recurs., Disp: 10 tablet, Rfl: 3 .  Testosterone 30 MG/ACT SOLN, Apply 60 mg to the axilla once daily every morning, Disp: 90 mL, Rfl: 3 .  traMADol (ULTRAM) 50 MG tablet, TAKE ONE TABLET BY MOUTH EVERY SIX HOURS AS NEEDED , Disp: 60 tablet, Rfl: 2 .  vardenafil (LEVITRA) 10 MG tablet, Take 1 tablet (10 mg total) by  mouth daily as needed. As directed, Disp: 10 tablet, Rfl: 4  EXAM:  Filed Vitals:   07/12/14 1344  BP: 110/78  Temp: 98.7 F (37.1 C)    Body mass index is 24.17 kg/(m^2).  GENERAL: vitals reviewed and listed above, alert, oriented, appears well hydrated and in no acute distress  HEENT: atraumatic, conjunttiva clear, no obvious abnormalities on inspection of external nose and ears; cerumen impaction bilaterally - resolved after ear lavage  NECK: no obvious masses on inspection  MS: moves all extremities without noticeable abnormality  PSYCH: pleasant and cooperative, no obvious depression or anxiety  ASSESSMENT AND PLAN:  Discussed the following assessment and plan:  Cerumen impaction, bilateral  -he wants ear lavage as has had this almost yearly per his report for this issue and worked well -risks discussed and tolerated well with resolution of symptoms -Patient advised to return or notify a doctor immediately if symptoms worsen or persist or new concerns arise.  There are no Patient Instructions on file for this visit.   Joseph Gill R.

## 2014-07-17 ENCOUNTER — Other Ambulatory Visit: Payer: Self-pay | Admitting: Internal Medicine

## 2014-11-23 ENCOUNTER — Other Ambulatory Visit: Payer: Self-pay | Admitting: Internal Medicine

## 2014-12-05 ENCOUNTER — Other Ambulatory Visit: Payer: Self-pay | Admitting: Internal Medicine

## 2015-02-15 ENCOUNTER — Other Ambulatory Visit: Payer: Self-pay | Admitting: Internal Medicine

## 2015-03-07 ENCOUNTER — Other Ambulatory Visit: Payer: Self-pay | Admitting: Internal Medicine

## 2015-03-11 NOTE — Telephone Encounter (Addendum)
Pt would like to know why his tramodal has not been filled?? Does he need appt? [facility]  / [address]  Pt called back and sch a cpe

## 2015-03-11 NOTE — Telephone Encounter (Signed)
Pt aware rx called in.  

## 2015-04-11 ENCOUNTER — Other Ambulatory Visit (INDEPENDENT_AMBULATORY_CARE_PROVIDER_SITE_OTHER): Payer: PRIVATE HEALTH INSURANCE

## 2015-04-11 DIAGNOSIS — R7989 Other specified abnormal findings of blood chemistry: Secondary | ICD-10-CM

## 2015-04-11 DIAGNOSIS — Z0000 Encounter for general adult medical examination without abnormal findings: Secondary | ICD-10-CM | POA: Diagnosis not present

## 2015-04-11 LAB — HEPATIC FUNCTION PANEL
ALBUMIN: 4.7 g/dL (ref 3.5–5.2)
ALT: 23 U/L (ref 0–53)
AST: 24 U/L (ref 0–37)
Alkaline Phosphatase: 69 U/L (ref 39–117)
Bilirubin, Direct: 0.1 mg/dL (ref 0.0–0.3)
TOTAL PROTEIN: 7.4 g/dL (ref 6.0–8.3)
Total Bilirubin: 0.5 mg/dL (ref 0.2–1.2)

## 2015-04-11 LAB — LIPID PANEL
CHOL/HDL RATIO: 5
Cholesterol: 216 mg/dL — ABNORMAL HIGH (ref 0–200)
HDL: 45.6 mg/dL (ref >39.00)
NonHDL: 170.2
TRIGLYCERIDES: 306 mg/dL — AB (ref 0.0–149.0)
VLDL: 61.2 mg/dL — AB (ref 0.0–40.0)

## 2015-04-11 LAB — POCT URINALYSIS DIPSTICK
BILIRUBIN UA: NEGATIVE
GLUCOSE UA: NEGATIVE
KETONES UA: NEGATIVE
Leukocytes, UA: NEGATIVE
NITRITE UA: NEGATIVE
Protein, UA: NEGATIVE
Urobilinogen, UA: 0.2
pH, UA: 7

## 2015-04-11 LAB — CBC WITH DIFFERENTIAL/PLATELET
BASOS PCT: 0.6 % (ref 0.0–3.0)
Basophils Absolute: 0 10*3/uL (ref 0.0–0.1)
EOS PCT: 4.3 % (ref 0.0–5.0)
Eosinophils Absolute: 0.3 10*3/uL (ref 0.0–0.7)
HEMATOCRIT: 44.8 % (ref 39.0–52.0)
HEMOGLOBIN: 14.8 g/dL (ref 13.0–17.0)
Lymphocytes Relative: 27.1 % (ref 12.0–46.0)
Lymphs Abs: 1.6 10*3/uL (ref 0.7–4.0)
MCHC: 33 g/dL (ref 30.0–36.0)
MCV: 94.1 fl (ref 78.0–100.0)
MONOS PCT: 8.9 % (ref 3.0–12.0)
Monocytes Absolute: 0.5 10*3/uL (ref 0.1–1.0)
Neutro Abs: 3.5 10*3/uL (ref 1.4–7.7)
Neutrophils Relative %: 59.1 % (ref 43.0–77.0)
Platelets: 285 10*3/uL (ref 150.0–400.0)
RBC: 4.76 Mil/uL (ref 4.22–5.81)
RDW: 13.6 % (ref 11.5–15.5)
WBC: 5.9 10*3/uL (ref 4.0–10.5)

## 2015-04-11 LAB — BASIC METABOLIC PANEL
BUN: 14 mg/dL (ref 6–23)
CHLORIDE: 102 meq/L (ref 96–112)
CO2: 30 mEq/L (ref 19–32)
Calcium: 10.2 mg/dL (ref 8.4–10.5)
Creatinine, Ser: 0.97 mg/dL (ref 0.40–1.50)
GFR: 82.98 mL/min (ref >60.00)
Glucose, Bld: 99 mg/dL (ref 70–99)
POTASSIUM: 4.9 meq/L (ref 3.5–5.1)
SODIUM: 141 meq/L (ref 135–145)

## 2015-04-11 LAB — TSH: TSH: 1.81 u[IU]/mL (ref 0.35–4.50)

## 2015-04-11 LAB — LDL CHOLESTEROL, DIRECT: LDL DIRECT: 91 mg/dL

## 2015-04-11 LAB — PSA: PSA: 0.99 ng/mL (ref 0.10–4.00)

## 2015-04-18 ENCOUNTER — Ambulatory Visit (INDEPENDENT_AMBULATORY_CARE_PROVIDER_SITE_OTHER): Payer: PRIVATE HEALTH INSURANCE | Admitting: Internal Medicine

## 2015-04-18 ENCOUNTER — Encounter: Payer: Self-pay | Admitting: Internal Medicine

## 2015-04-18 VITALS — BP 110/90 | HR 82 | Temp 98.7°F | Resp 14 | Ht 69.5 in | Wt 170.0 lb

## 2015-04-18 DIAGNOSIS — E785 Hyperlipidemia, unspecified: Secondary | ICD-10-CM

## 2015-04-18 DIAGNOSIS — Z0000 Encounter for general adult medical examination without abnormal findings: Secondary | ICD-10-CM | POA: Diagnosis not present

## 2015-04-18 DIAGNOSIS — E349 Endocrine disorder, unspecified: Secondary | ICD-10-CM

## 2015-04-18 NOTE — Progress Notes (Signed)
..  lb

## 2015-04-18 NOTE — Patient Instructions (Signed)
It is important that you exercise regularly, at least 20 minutes 3 to 4 times per week.  If you develop chest pain or shortness of breath seek  medical attention.  Health Maintenance, Male A healthy lifestyle and preventative care can promote health and wellness.  Maintain regular health, dental, and eye exams.  Eat a healthy diet. Foods like vegetables, fruits, whole grains, low-fat dairy products, and lean protein foods contain the nutrients you need and are low in calories. Decrease your intake of foods high in solid fats, added sugars, and salt. Get information about a proper diet from your health care provider, if necessary.  Regular physical exercise is one of the most important things you can do for your health. Most adults should get at least 150 minutes of moderate-intensity exercise (any activity that increases your heart rate and causes you to sweat) each week. In addition, most adults need muscle-strengthening exercises on 2 or more days a week.   Maintain a healthy weight. The body mass index (BMI) is a screening tool to identify possible weight problems. It provides an estimate of body fat based on height and weight. Your health care provider can find your BMI and can help you achieve or maintain a healthy weight. For males 20 years and older:  A BMI below 18.5 is considered underweight.  A BMI of 18.5 to 24.9 is normal.  A BMI of 25 to 29.9 is considered overweight.  A BMI of 30 and above is considered obese.  Maintain normal blood lipids and cholesterol by exercising and minimizing your intake of saturated fat. Eat a balanced diet with plenty of fruits and vegetables. Blood tests for lipids and cholesterol should begin at age 20 and be repeated every 5 years. If your lipid or cholesterol levels are high, you are over age 50, or you are at high risk for heart disease, you may need your cholesterol levels checked more frequently.Ongoing high lipid and cholesterol levels should  be treated with medicines if diet and exercise are not working.  If you smoke, find out from your health care provider how to quit. If you do not use tobacco, do not start.  Lung cancer screening is recommended for adults aged 55-80 years who are at high risk for developing lung cancer because of a history of smoking. A yearly low-dose CT scan of the lungs is recommended for people who have at least a 30-pack-year history of smoking and are current smokers or have quit within the past 15 years. A pack year of smoking is smoking an average of 1 pack of cigarettes a day for 1 year (for example, a 30-pack-year history of smoking could mean smoking 1 pack a day for 30 years or 2 packs a day for 15 years). Yearly screening should continue until the smoker has stopped smoking for at least 15 years. Yearly screening should be stopped for people who develop a health problem that would prevent them from having lung cancer treatment.  If you choose to drink alcohol, do not have more than 2 drinks per day. One drink is considered to be 12 oz (360 mL) of beer, 5 oz (150 mL) of wine, or 1.5 oz (45 mL) of liquor.  Avoid the use of street drugs. Do not share needles with anyone. Ask for help if you need support or instructions about stopping the use of drugs.  High blood pressure causes heart disease and increases the risk of stroke. High blood pressure is more likely to   develop in:  People who have blood pressure in the end of the normal range (100-139/85-89 mm Hg).  People who are overweight or obese.  People who are African American.  If you are 61-80 years of age, have your blood pressure checked every 3-5 years. If you are 47 years of age or older, have your blood pressure checked every year. You should have your blood pressure measured twice--once when you are at a hospital or clinic, and once when you are not at a hospital or clinic. Record the average of the two measurements. To check your blood pressure  when you are not at a hospital or clinic, you can use:  An automated blood pressure machine at a pharmacy.  A home blood pressure monitor.  If you are 64-34 years old, ask your health care provider if you should take aspirin to prevent heart disease.  Diabetes screening involves taking a blood sample to check your fasting blood sugar level. This should be done once every 3 years after age 51 if you are at a normal weight and without risk factors for diabetes. Testing should be considered at a younger age or be carried out more frequently if you are overweight and have at least 1 risk factor for diabetes.  Colorectal cancer can be detected and often prevented. Most routine colorectal cancer screening begins at the age of 47 and continues through age 8. However, your health care provider may recommend screening at an earlier age if you have risk factors for colon cancer. On a yearly basis, your health care provider may provide home test kits to check for hidden blood in the stool. A small camera at the end of a tube may be used to directly examine the colon (sigmoidoscopy or colonoscopy) to detect the earliest forms of colorectal cancer. Talk to your health care provider about this at age 65 when routine screening begins. A direct exam of the colon should be repeated every 5-10 years through age 108, unless early forms of precancerous polyps or small growths are found.  People who are at an increased risk for hepatitis B should be screened for this virus. You are considered at high risk for hepatitis B if:  You were born in a country where hepatitis B occurs often. Talk with your health care provider about which countries are considered high risk.  Your parents were born in a high-risk country and you have not received a shot to protect against hepatitis B (hepatitis B vaccine).  You have HIV or AIDS.  You use needles to inject street drugs.  You live with, or have sex with, someone who has  hepatitis B.  You are a man who has sex with other men (MSM).  You get hemodialysis treatment.  You take certain medicines for conditions like cancer, organ transplantation, and autoimmune conditions.  Hepatitis C blood testing is recommended for all people born from [birth year] through 1964 and any individual with known risk factors for hepatitis C.  Healthy men should no longer receive prostate-specific antigen (PSA) blood tests as part of routine cancer screening. Talk to your health care provider about prostate cancer screening.  Testicular cancer screening is not recommended for adolescents or adult males who have no symptoms. Screening includes self-exam, a health care provider exam, and other screening tests. Consult with your health care provider about any symptoms you have or any concerns you have about testicular cancer.  Practice safe sex. Use condoms and avoid high-risk sexual practices to reduce  the spread of sexually transmitted infections (STIs).  You should be screened for STIs, including gonorrhea and chlamydia if:  You are sexually active and are younger than 24 years.  You are older than 24 years, and your health care provider tells you that you are at risk for this type of infection.  Your sexual activity has changed since you were last screened, and you are at an increased risk for chlamydia or gonorrhea. Ask your health care provider if you are at risk.  If you are at risk of being infected with HIV, it is recommended that you take a prescription medicine daily to prevent HIV infection. This is called pre-exposure prophylaxis (PrEP). You are considered at risk if:  You are a man who has sex with other men (MSM).  You are a heterosexual man who is sexually active with multiple partners.  You take drugs by injection.  You are sexually active with a partner who has HIV.  Talk with your health care provider about whether you are at high risk of being infected with HIV. If  you choose to begin PrEP, you should first be tested for HIV. You should then be tested every 3 months for as long as you are taking PrEP.  Use sunscreen. Apply sunscreen liberally and repeatedly throughout the day. You should seek shade when your shadow is shorter than you. Protect yourself by wearing long sleeves, pants, a wide-brimmed hat, and sunglasses year round whenever you are outdoors.  Tell your health care provider of new moles or changes in moles, especially if there is a change in shape or color. Also, tell your health care provider if a mole is larger than the size of a pencil eraser.  A one-time screening for abdominal aortic aneurysm (AAA) and surgical repair of large AAAs by ultrasound is recommended for men aged 31-75 years who are current or former smokers.  Stay current with your vaccines (immunizations).   This information is not intended to replace advice given to you by your health care provider. Make sure you discuss any questions you have with your health care provider.   Document Released: 12/08/2007 Document Revised: 07/02/2014 Document Reviewed: 11/06/2010 Elsevier Interactive Patient Education 2016 [employer]. Insomnia Insomnia is a sleep disorder that makes it difficult to fall asleep or to stay asleep. Insomnia can cause tiredness (fatigue), low energy, difficulty concentrating, mood swings, and poor performance at work or school.  There are three different ways to classify insomnia:  Difficulty falling asleep.  Difficulty staying asleep.  Waking up too early in the morning. Any type of insomnia can be long-term (chronic) or short-term (acute). Both are common. Short-term insomnia usually lasts for three months or less. Chronic insomnia occurs at least three times a week for longer than three months. CAUSES  Insomnia may be caused by another condition, situation, or substance, such as:  Anxiety.  Certain medicines.  Gastroesophageal reflux disease  (GERD) or other gastrointestinal conditions.  Asthma or other breathing conditions.  Restless legs syndrome, sleep apnea, or other sleep disorders.  Chronic pain.  Menopause. This may include hot flashes.  Stroke.  Abuse of alcohol, tobacco, or illegal drugs.  Depression.  Caffeine.   Neurological disorders, such as Alzheimer disease.  An overactive thyroid (hyperthyroidism). The cause of insomnia may not be known. RISK FACTORS Risk factors for insomnia include:  Gender. Women are more commonly affected than men.  Age. Insomnia is more common as you get older.  Stress. This may involve your  professional or personal life.  Income. Insomnia is more common in people with lower income.  Lack of exercise.   Irregular work schedule or night shifts.  Traveling between different time zones. SIGNS AND SYMPTOMS If you have insomnia, trouble falling asleep or trouble staying asleep is the main symptom. This may lead to other symptoms, such as:  Feeling fatigued.  Feeling nervous about going to sleep.  Not feeling rested in the morning.  Having trouble concentrating.  Feeling irritable, anxious, or depressed. TREATMENT  Treatment for insomnia depends on the cause. If your insomnia is caused by an underlying condition, treatment will focus on addressing the condition. Treatment may also include:   Medicines to help you sleep.  Counseling or therapy.  Lifestyle adjustments. HOME CARE INSTRUCTIONS   Take medicines only as directed by your health care provider.  Keep regular sleeping and waking hours. Avoid naps.  Keep a sleep diary to help you and your health care provider figure out what could be causing your insomnia. Include:   When you sleep.  When you wake up during the night.  How well you sleep.   How rested you feel the next day.  Any side effects of medicines you are taking.  What you eat and drink.   Make your bedroom a comfortable place  where it is easy to fall asleep:  Put up shades or special blackout curtains to block light from outside.  Use a white noise machine to block noise.  Keep the temperature cool.   Exercise regularly as directed by your health care provider. Avoid exercising right before bedtime.  Use relaxation techniques to manage stress. Ask your health care provider to suggest some techniques that may work well for you. These may include:  Breathing exercises.  Routines to release muscle tension.  Visualizing peaceful scenes.  Cut back on alcohol, caffeinated beverages, and cigarettes, especially close to bedtime. These can disrupt your sleep.  Do not overeat or eat spicy foods right before bedtime. This can lead to digestive discomfort that can make it hard for you to sleep.  Limit screen use before bedtime. This includes:  Watching TV.  Using your smartphone, tablet, and computer.  Stick to a routine. This can help you fall asleep faster. Try to do a quiet activity, brush your teeth, and go to bed at the same time each night.  Get out of bed if you are still awake after 15 minutes of trying to sleep. Keep the lights down, but try reading or doing a quiet activity. When you feel sleepy, go back to bed.  Make sure that you drive carefully. Avoid driving if you feel very sleepy.  Keep all follow-up appointments as directed by your health care provider. This is important. SEEK MEDICAL CARE IF:   You are tired throughout the day or have trouble in your daily routine due to sleepiness.  You continue to have sleep problems or your sleep problems get worse. SEEK IMMEDIATE MEDICAL CARE IF:   You have serious thoughts about hurting yourself or someone else.   This information is not intended to replace advice given to you by your health care provider. Make sure you discuss any questions you have with your health care provider.   Document Released: 06/08/2000 Document Revised: 03/02/2015  Document Reviewed: 03/12/2014 Elsevier Interactive Patient Education [employer].

## 2015-04-18 NOTE — Progress Notes (Signed)
Subjective:    Patient ID: Joseph Gill, male    DOB: Feb 23, 1951, 64 y.o.   MRN: [id number]  HPI   31 -year-old patient who is in today for a preventive health examination Medical problems include dyslipidemia.  Lipid profile reviewed.  He has recently retired, but doing some part-time work.  Complaints today include some left shoulder pain after doing some heavy lifting.  He has some occasional episodic insomnia.  Family history mother died this past summer at 13 from complications of pneumonia and a heart attack.  Past Medical History  Diagnosis Date  . ALLERGIC RHINITIS 03/26/2007  . 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 MIGRAINE HEADACHE 11/03/2008    Social History   Social History  . Marital Status: Married    Spouse Name: N/A  . Number of Children: N/A  . Years of Education: N/A   Occupational History  . Not on file.   Social History Main Topics  . Smoking status: Never Smoker   . Smokeless tobacco: Never Used  . Alcohol Use: Yes  . Drug Use: No  . Sexual Activity: Not on file   Other Topics Concern  . Not on file   Social History Narrative    Past Surgical History  Procedure Laterality Date  . Tonsillectomy    . Nasal sinus surgery      No family history on file.  Allergies  Allergen Reactions  . Erythromycin     Current Outpatient Prescriptions on File Prior to Visit  Medication Sig Dispense Refill  . amitriptyline (ELAVIL) 25 MG tablet TAKE ONE TABLET BY MOUTH AT BEDTIME 90 tablet 0  . atorvastatin (LIPITOR) 40 MG tablet TAKE ONE TABLET BY MOUTH ONE TIME DAILY  90 tablet 3  . loratadine (CLARITIN) 10 MG tablet Take 10 mg by mouth daily.      . mometasone (NASONEX) 50 MCG/ACT nasal spray PLACE 2 SPRAYS INTO THE NOSE  DAILY. 17 g 2  . montelukast (SINGULAIR) 10 MG tablet Take 1 tablet (10 mg total) by mouth at bedtime. 30 tablet 3  . SUMAtriptan (IMITREX) 100 MG tablet TAKE 1 TABLET BY MOUTH AT ONSET OF MIGRAINE OR HEADACHE.  MAY  REPEAT IN 2 HOURS IF HEADACHE PERSISTS. 10 tablet 0  . Testosterone 30 MG/ACT SOLN Apply 60 mg to the axilla once daily every morning 90 mL 3  . traMADol (ULTRAM) 50 MG tablet TAKE ONE TABLET BY MOUTH EVERY SIX HOURS AS NEEDED 60 tablet 2  . vardenafil (LEVITRA) 10 MG tablet Take 1 tablet (10 mg total) by mouth daily as needed. As directed 10 tablet 4   No current facility-administered medications on file prior to visit.    BP 110/90 mmHg  Pulse 82  Temp(Src) 98.7 F (37.1 C) (Oral)  Resp 14  Ht 5' 9.5" (1.765 m)  Wt 170 lb (77.111 kg)  BMI 24.75 kg/m2     Review of Systems  Constitutional: Positive for fatigue. Negative for fever, chills and appetite change.  HENT: Negative for congestion, dental problem, ear pain, hearing loss, sore throat, tinnitus, trouble swallowing and voice change.   Eyes: Negative for pain, discharge and visual disturbance.  Respiratory: Negative for cough, chest tightness, wheezing and stridor.   Cardiovascular: Negative for chest pain, palpitations and leg swelling.  Gastrointestinal: Positive for constipation. Negative for nausea, vomiting, abdominal pain, diarrhea, blood in stool and abdominal distention.  Genitourinary: Negative for urgency, hematuria, flank pain, discharge, difficulty urinating and genital sores.  Musculoskeletal: Positive for  myalgias. Negative for back pain, joint swelling, arthralgias, gait problem and neck stiffness.  Skin: Negative for rash.  Neurological: Negative for dizziness, syncope, speech difficulty, weakness, numbness and headaches.  Hematological: Negative for adenopathy. Does not bruise/bleed easily.  Psychiatric/Behavioral: Negative for behavioral problems and dysphoric mood. The patient is not nervous/anxious.        Objective:   Physical Exam  Constitutional: He appears well-developed and well-nourished.  HENT:  Head: Normocephalic and atraumatic.  Right Ear: External ear normal.  Left Ear: External ear  normal.  Nose: Nose normal.  Mouth/Throat: Oropharynx is clear and moist.  Eyes: Conjunctivae and EOM are normal. Pupils are equal, round, and reactive to light. No scleral icterus.  Neck: Normal range of motion. Neck supple. No JVD present. No thyromegaly present.  Cardiovascular: Regular rhythm, normal heart sounds and intact distal pulses.  Exam reveals no gallop and no friction rub.   No murmur heard. Pulmonary/Chest: Effort normal and breath sounds normal. He exhibits no tenderness.  Abdominal: Soft. Bowel sounds are normal. He exhibits no distension and no mass. There is no tenderness.  Genitourinary: Prostate normal and penis normal. Guaiac negative stool.  Musculoskeletal: Normal range of motion. He exhibits no edema or tenderness.  Lymphadenopathy:    He has no cervical adenopathy.  Neurological: He is alert. He has normal reflexes. No cranial nerve deficit. Coordination normal.  Skin: Skin is warm and dry. No rash noted.  Psychiatric: He has a normal mood and affect. His behavior is normal.          Assessment & Plan:   Preventive health examination  Colonoscopy recommended for 2017. Allergic rhinitis, stable Dyslipidemia.  Continue Lipitor Migraine headaches, stable  Recheck 6 months or as needed

## 2015-04-25 ENCOUNTER — Other Ambulatory Visit: Payer: Self-pay | Admitting: Internal Medicine

## 2015-04-30 ENCOUNTER — Other Ambulatory Visit: Payer: Self-pay | Admitting: Internal Medicine

## 2015-05-03 ENCOUNTER — Other Ambulatory Visit: Payer: Self-pay | Admitting: Internal Medicine

## 2015-06-01 ENCOUNTER — Other Ambulatory Visit: Payer: Self-pay | Admitting: Internal Medicine

## 2015-06-07 ENCOUNTER — Other Ambulatory Visit: Payer: Self-pay | Admitting: Internal Medicine

## 2015-07-06 ENCOUNTER — Other Ambulatory Visit: Payer: Self-pay | Admitting: Internal Medicine

## 2015-09-27 ENCOUNTER — Ambulatory Visit (INDEPENDENT_AMBULATORY_CARE_PROVIDER_SITE_OTHER): Payer: No Typology Code available for payment source | Admitting: Adult Health

## 2015-09-27 ENCOUNTER — Encounter: Payer: Self-pay | Admitting: Adult Health

## 2015-09-27 VITALS — BP 112/86 | Temp 98.7°F | Wt 176.1 lb

## 2015-09-27 DIAGNOSIS — H6123 Impacted cerumen, bilateral: Secondary | ICD-10-CM | POA: Diagnosis not present

## 2015-09-27 NOTE — Progress Notes (Signed)
Subjective:    Patient ID: Joseph Gill, male    DOB: 24-Oct-1950, 65 y.o.   MRN: [id number]  Ear Fullness  There is pain in both (R>L) ears. This is a recurrent problem. The current episode started more than 1 month ago. The problem has been gradually worsening. There has been no fever. The patient is experiencing no pain. Associated symptoms include hearing loss. Pertinent negatives include no rhinorrhea. He has tried nothing for the symptoms.    Review of Systems  Constitutional: Negative.   HENT: Positive for hearing loss and tinnitus (chronic). Negative for postnasal drip, rhinorrhea, trouble swallowing and voice change.   All other systems reviewed and are negative.  Past Medical History  Diagnosis Date  . ALLERGIC RHINITIS 03/26/2007  . 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 MIGRAINE HEADACHE 11/03/2008    Social History   Social History  . Marital Status: Married    Spouse Name: N/A  . Number of Children: N/A  . Years of Education: N/A   Occupational History  . Not on file.   Social History Main Topics  . Smoking status: Never Smoker   . Smokeless tobacco: Never Used  . Alcohol Use: Yes  . Drug Use: No  . Sexual Activity: Not on file   Other Topics Concern  . Not on file   Social History Narrative    Past Surgical History  Procedure Laterality Date  . Tonsillectomy    . Nasal sinus surgery      No family history on file.  Allergies  Allergen Reactions  . Erythromycin     Current Outpatient Prescriptions on File Prior to Visit  Medication Sig Dispense Refill  . amitriptyline (ELAVIL) 25 MG tablet TAKE ONE TABLET BY MOUTH AT BEDTIME 90 tablet 3  . atorvastatin (LIPITOR) 40 MG tablet TAKE ONE TABLET BY MOUTH ONE TIME DAILY 90 tablet 1  . FLUARIX QUADRIVALENT 0.5 ML injection 0.5 Doses.    Marland Kitchen loratadine (CLARITIN) 10 MG tablet Take 10 mg by mouth daily.      . mometasone (NASONEX) 50 MCG/ACT nasal spray PLACE 2 SPRAYS INTO THE NOSE  DAILY. 17  g 2  . montelukast (SINGULAIR) 10 MG tablet Take 1 tablet (10 mg total) by mouth at bedtime. 30 tablet 3  . SUMAtriptan (IMITREX) 100 MG tablet TAKE 1 TABLET BY MOUTH AT ONSET OF MIGRAINE OR HEADACHE. MAY REPEAT IN 2 HOURS IF HEADACHE PERSISTS. 10 tablet 0  . traMADol (ULTRAM) 50 MG tablet TAKE ONE TABLET BY MOUTH EVERY SIX HOURS AS NEEDED 60 tablet 2  . vardenafil (LEVITRA) 10 MG tablet Take 1 tablet (10 mg total) by mouth daily as needed. As directed 10 tablet 4   No current facility-administered medications on file prior to visit.    BP 112/86 mmHg  Temp(Src) 98.7 F (37.1 C) (Oral)  Wt 176 lb 1.6 oz (79.878 kg)       Objective:   Physical Exam  Constitutional: He is oriented to person, place, and time. He appears well-developed and well-nourished. No distress.  HENT:  Head: Normocephalic and atraumatic.  Right Ear: Hearing, tympanic membrane, external ear and ear canal normal.  Left Ear: Hearing and external ear normal.  Nose: Nose normal.  Mouth/Throat: Oropharynx is clear and moist. No oropharyngeal exudate.  Cerumen Impaction in bilateral ears  Cardiovascular: Normal rate, regular rhythm, normal heart sounds and intact distal pulses.  Exam reveals no gallop and no friction rub.   No murmur heard. Pulmonary/Chest: Effort  normal and breath sounds normal. No respiratory distress. He has no wheezes. He has no rales. He exhibits no tenderness.  Neurological: He is alert and oriented to person, place, and time.  Skin: Skin is warm and dry. No rash noted. He is not diaphoretic. No erythema. No pallor.  Psychiatric: He has a normal mood and affect. His behavior is normal. Judgment and thought content normal.  Nursing note and vitals reviewed.     Assessment & Plan:  1. Cerumen impaction, bilateral - Right ear was irrigated and cerumen impaction was easily removed. Material was irrigated and cerumen impaction was not able to be removed with irrigation alone. Instrumentation was  used and cerumen impaction was removed - Follow up as needed  Dorothyann Peng, NP

## 2015-10-02 ENCOUNTER — Other Ambulatory Visit: Payer: Self-pay | Admitting: Internal Medicine

## 2015-10-11 ENCOUNTER — Other Ambulatory Visit: Payer: Self-pay | Admitting: Internal Medicine

## 2015-11-23 ENCOUNTER — Encounter: Payer: Self-pay | Admitting: Adult Health

## 2015-11-23 ENCOUNTER — Ambulatory Visit (INDEPENDENT_AMBULATORY_CARE_PROVIDER_SITE_OTHER): Payer: No Typology Code available for payment source | Admitting: Adult Health

## 2015-11-23 VITALS — BP 128/74 | Temp 98.3°F | Wt 172.0 lb

## 2015-11-23 DIAGNOSIS — L259 Unspecified contact dermatitis, unspecified cause: Secondary | ICD-10-CM | POA: Diagnosis not present

## 2015-11-23 NOTE — Progress Notes (Signed)
Subjective:    Patient ID: Joseph Gill, male    DOB: [birth year], 65 y.o.   MRN: [id number]  Rash This is a new problem. The current episode started in the past 7 days. The problem is unchanged. The affected locations include the right lowerleg. The rash is characterized by redness. He was exposed to an insect bite/sting and plant contact. Pertinent negatives include no facial edema, fatigue, fever, joint pain, rhinorrhea, shortness of breath or vomiting. Past treatments include topical steroids. The treatment provided no relief. His past medical history is significant for allergies.   Joseph Gill reports a red rash on his right calf. He is concerned that it may be from a tick bite. Denies any itching or pain. Denies seeing a tick but reports " I did see some type of insect." He does work in the yard a lot and is in contact with many plants and fertilizers   Review of Systems  Constitutional: Negative for fever and fatigue.  HENT: Negative for rhinorrhea.   Respiratory: Negative for shortness of breath.   Gastrointestinal: Negative for vomiting.  Musculoskeletal: Negative for joint pain.  Skin: Positive for color change and rash. Negative for pallor and wound.   Past Medical History  Diagnosis Date  . ALLERGIC RHINITIS 03/26/2007  . 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 MIGRAINE HEADACHE 11/03/2008    Social History   Social History  . Marital Status: Married    Spouse Name: N/A  . Number of Children: N/A  . Years of Education: N/A   Occupational History  . Not on file.   Social History Main Topics  . Smoking status: Never Smoker   . Smokeless tobacco: Never Used  . Alcohol Use: Yes  . Drug Use: No  . Sexual Activity: Not on file   Other Topics Concern  . Not on file   Social History Narrative    Past Surgical History  Procedure Laterality Date  . Tonsillectomy    . Nasal sinus surgery      No family history on file.  Allergies  Allergen Reactions  .  Erythromycin     Current Outpatient Prescriptions on File Prior to Visit  Medication Sig Dispense Refill  . amitriptyline (ELAVIL) 25 MG tablet TAKE ONE TABLET BY MOUTH AT BEDTIME 90 tablet 3  . amitriptyline (ELAVIL) 25 MG tablet TAKE ONE TABLET BY MOUTH AT BEDTIME 90 tablet 1  . atorvastatin (LIPITOR) 40 MG tablet TAKE ONE TABLET BY MOUTH ONE TIME DAILY 90 tablet 1  . FLUARIX QUADRIVALENT 0.5 ML injection 0.5 Doses.    Marland Kitchen loratadine (CLARITIN) 10 MG tablet Take 10 mg by mouth daily.      . mometasone (NASONEX) 50 MCG/ACT nasal spray PLACE 2 SPRAYS INTO THE NOSE  DAILY. 17 g 2  . montelukast (SINGULAIR) 10 MG tablet Take 1 tablet (10 mg total) by mouth at bedtime. 30 tablet 3  . SUMAtriptan (IMITREX) 100 MG tablet TAKE 1 TABLET BY MOUTH AT ONSET OF MIGRAINE OR HEADACHE. MAY REPEAT IN 2 HOURS IF HEADACHE PERSISTS. 10 tablet 0  . traMADol (ULTRAM) 50 MG tablet TAKE ONE TABLET BY MOUTH EVERY SIX HOURS AS NEEDED 60 tablet 2  . vardenafil (LEVITRA) 10 MG tablet Take 1 tablet (10 mg total) by mouth daily as needed. As directed 10 tablet 4   No current facility-administered medications on file prior to visit.    BP 128/74 mmHg  Temp(Src) 98.3 F (36.8 C) (Oral)  Wt 172 lb (78.019 kg)  Objective:   Physical Exam  Constitutional: He appears well-developed and well-nourished. No distress.  Skin: Skin is warm and dry. Rash noted. He is not diaphoretic. There is erythema. No pallor.  Red flat rash located on the right calf. No vesicles, ulcerations, or papules. No bulls eye rash. Appears to be healing.     Psychiatric: He has a normal mood and affect. His behavior is normal. Judgment and thought content normal.  Nursing note and vitals reviewed.      Assessment & Plan:  1. Contact dermatitis - Does not appear as tick bite. Possibly contact dermatitis from a plant.  - Continue to monitor. Can continue to apply OTC hydrocortisone cream - Follow up if no improvement.   Dorothyann Peng, NP

## 2016-01-22 ENCOUNTER — Other Ambulatory Visit: Payer: Self-pay | Admitting: Internal Medicine

## 2016-01-23 NOTE — Telephone Encounter (Signed)
Okay to refill? 

## 2016-01-24 NOTE — Telephone Encounter (Signed)
Rx called in & rx sent. 

## 2016-02-15 ENCOUNTER — Encounter: Payer: Self-pay | Admitting: Gastroenterology

## 2016-03-01 ENCOUNTER — Encounter: Payer: Self-pay | Admitting: Adult Health

## 2016-03-01 ENCOUNTER — Ambulatory Visit (INDEPENDENT_AMBULATORY_CARE_PROVIDER_SITE_OTHER): Payer: No Typology Code available for payment source | Admitting: Adult Health

## 2016-03-01 VITALS — BP 128/74 | Temp 98.4°F | Ht 69.5 in | Wt 168.9 lb

## 2016-03-01 DIAGNOSIS — L959 Vasculitis limited to the skin, unspecified: Secondary | ICD-10-CM

## 2016-03-01 LAB — CBC WITH DIFFERENTIAL/PLATELET
BASOS ABS: 0 10*3/uL (ref 0.0–0.1)
Basophils Relative: 0.4 % (ref 0.0–3.0)
EOS ABS: 0.4 10*3/uL (ref 0.0–0.7)
Eosinophils Relative: 5.3 % — ABNORMAL HIGH (ref 0.0–5.0)
HEMATOCRIT: 47 % (ref 39.0–52.0)
Hemoglobin: 15.9 g/dL (ref 13.0–17.0)
LYMPHS PCT: 13.7 % (ref 12.0–46.0)
Lymphs Abs: 1.1 10*3/uL (ref 0.7–4.0)
MCHC: 33.8 g/dL (ref 30.0–36.0)
MCV: 93.6 fl (ref 78.0–100.0)
Monocytes Absolute: 0.7 10*3/uL (ref 0.1–1.0)
Monocytes Relative: 8.9 % (ref 3.0–12.0)
NEUTROS ABS: 5.9 10*3/uL (ref 1.4–7.7)
NEUTROS PCT: 71.7 % (ref 43.0–77.0)
PLATELETS: 310 10*3/uL (ref 150.0–400.0)
RBC: 5.02 Mil/uL (ref 4.22–5.81)
RDW: 13.7 % (ref 11.5–15.5)
WBC: 8.3 10*3/uL (ref 4.0–10.5)

## 2016-03-01 LAB — HEPATIC FUNCTION PANEL
ALK PHOS: 68 U/L (ref 39–117)
ALT: 24 U/L (ref 0–53)
AST: 24 U/L (ref 0–37)
Albumin: 5.1 g/dL (ref 3.5–5.2)
BILIRUBIN DIRECT: 0 mg/dL (ref 0.0–0.3)
TOTAL PROTEIN: 8.4 g/dL — AB (ref 6.0–8.3)
Total Bilirubin: 0.4 mg/dL (ref 0.2–1.2)

## 2016-03-01 LAB — SEDIMENTATION RATE: Sed Rate: 8 mm/hr (ref 0–20)

## 2016-03-01 LAB — CREATININE, SERUM: Creatinine, Ser: 0.88 mg/dL (ref 0.40–1.50)

## 2016-03-01 LAB — C-REACTIVE PROTEIN: CRP: 0.5 mg/dL (ref 0.5–20.0)

## 2016-03-01 NOTE — Patient Instructions (Addendum)
It was great seeing you again.   I will follow up with you regarding your lab work and then we can go from there.   If you would like you can try putting some hydrocortisone cream on the rash   Vasculitis Vasculitis is swelling (inflammation) of the blood vessels. With vasculitis, the blood vessels can become thick, narrow, scarred, or weak, and enough blood may not be able to flow through them. This can cause damage to the muscles, kidneys, lungs, brain, and other parts of the body.  There are many types of vasculitis. Some last only a short time while others last a long time. CAUSES  The exact cause is unknown, but vasculitis can develop when the body's immune system attacks its own blood vessels. This attack can be caused by:  An infection.  An immune system disease, such as lupus, rheumatoid arthritis, or scleroderma.  An allergic reaction to a medicine.  Cancer that affects blood cells, such as leukemia and lymphoma. RISK FACTORS  Being a smoker.  Being under stress.  Having a physical injury. SIGNS AND SYMPTOMS  Symptoms vary depending on the type of vasculitis you have. Symptoms that are common to all types of vasculitis include:  Fever.  Poor appetite.  Weight loss.  Feeling very tired.  Having aches and pains.  Weakness.  Numbness in an area of your body. Symptoms for specific types of vasculitis include:  Skin problems, such as sores, spots, or rashes.  Trouble seeing.  Trouble breathing.  Blood in your urine.  Headaches.  Stomach pain.  Stuffy or bloody nose. DIAGNOSIS  Your health care provider will ask about your symptoms and do a physical exam. You may have tests done, such as:  A complete blood count (CBC).  Erythrocyte sedimentation, also called sed rate test.  C-reactive protein (CRP).  Antineutrophil cytoplasmic antibodies (ANCA).  A urine test.  A biopsy of a blood vessel.  A nerve conduction study.  Imaging tests, such  as:  X-rays.  A CT scan.  An ultrasound.  An MRI.  Angiography. TREATMENT  Treatment will depend on the type of vasculitis you have and how severe it is. Sometimes treatment is not needed. Treatment often includes:  Medicines.  Physical therapy or occupational therapy. This helps strengthen muscles that were weakened by the disease. You will need to see your health care provider while you are being treated. During follow-up visits, your health care provider will:  Perform blood tests and bone density tests.  Check your blood pressure and blood sugar.  Check for side effects of any medicines you are taking. Vasculitis cannot always be cured. Sometimes symptoms go away but the disease does not (the disease goes in remission). If symptoms return, increased treatment may be needed. HOME CARE INSTRUCTIONS   Take medicines only as directed by your health care provider.  Keep all follow-up visits as directed by your health care provider. This is important.  Exercise. Talk with your health care provider about what exercises are okay for you to do. Usually exercises that increase your heart rate (aerobic exercise), such as walking, are recommended. Aerobic exercise helps control your blood pressure and prevent bone loss.  Follow a healthy diet. Include healthy sources of protein, fruits, vegetables, and whole grains in your diet.  Learn as much as you can about vasculitis, and consider joining a support group. Understanding your condition and talking with others who have it may help you cope. Talk with your health care provider if  you feel stressed, anxious, or depressed. SEEK MEDICAL CARE IF:   Your symptoms return, or you have new symptoms.  Your fever, fatigue, headache, or weight loss gets worse.  You have signs of infection, such as fever, warmth, tenderness, redness, or swelling. SEEK IMMEDIATE MEDICAL CARE IF:   Your vision gets worse.  Your pain does not go away, even  after you take pain medicine.  You have chest or stomach pain.  You have trouble breathing.  One side of your face or body suddenly becomes weak or numb.  Your nose bleeds.  There is blood in your urine. MAKE SURE YOU:  Understand these instructions.  Will watch your condition.  Will get help right away if you are not doing well or get worse.   This information is not intended to replace advice given to you by your health care provider. Make sure you discuss any questions you have with your health care provider.   Document Released: 04/07/2009 Document Revised: 07/02/2014 Document Reviewed: 08/05/2013 Elsevier Interactive Patient Education [employer].

## 2016-03-01 NOTE — Progress Notes (Signed)
Subjective:    Patient ID: Joseph Gill, male    DOB: 10-05-1950, 65 y.o.   MRN: [id number]  Rash  This is a new problem. The current episode started in the past 7 days (2 days ago ). The problem has been gradually worsening since onset. The affected locations include the head, face, chest, torso, back, abdomen, groin, left arm, right arm, right upper leg, right lower leg, left upper leg and left lower leg. The rash is characterized by redness and itchiness. He was exposed to nothing. Pertinent negatives include no anorexia, diarrhea, facial edema, fatigue, fever, joint pain, shortness of breath, sore throat or vomiting. Past treatments include analgesics. The treatment provided no relief.     Review of Systems  Constitutional: Negative.  Negative for fatigue and fever.  HENT: Negative for sore throat.   Respiratory: Negative.  Negative for shortness of breath.   Cardiovascular: Negative.   Gastrointestinal: Negative for anorexia, diarrhea and vomiting.  Musculoskeletal: Negative for joint pain.  Skin: Positive for rash.  Neurological: Negative.   Hematological: Negative.   All other systems reviewed and are negative.  Past Medical History:  Diagnosis Date  . ALLERGIC RHINITIS 03/26/2007  . 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 MIGRAINE HEADACHE 11/03/2008    Social History   Social History  . Marital status: Married    Spouse name: N/A  . Number of children: N/A  . Years of education: N/A   Occupational History  . Not on file.   Social History Main Topics  . Smoking status: Never Smoker  . Smokeless tobacco: Never Used  . Alcohol use Yes  . Drug use: No  . Sexual activity: Not on file   Other Topics Concern  . Not on file   Social History Narrative  . No narrative on file    Past Surgical History:  Procedure Laterality Date  . NASAL SINUS SURGERY    . TONSILLECTOMY      No family history on file.  Allergies  Allergen Reactions  . Erythromycin      Current Outpatient Prescriptions on File Prior to Visit  Medication Sig Dispense Refill  . amitriptyline (ELAVIL) 25 MG tablet TAKE ONE TABLET BY MOUTH AT BEDTIME 90 tablet 1  . atorvastatin (LIPITOR) 40 MG tablet TAKE ONE TABLET BY MOUTH ONE TIME DAILY 90 tablet 1  . FLUARIX QUADRIVALENT 0.5 ML injection 0.5 Doses.    . mometasone (NASONEX) 50 MCG/ACT nasal spray PLACE 2 SPRAYS INTO THE NOSE  DAILY. 17 g 2  . SUMAtriptan (IMITREX) 100 MG tablet TAKE 1 TABLET BY MOUTH AT ONSET OF MIGRAINE OR HEADACHE. MAY REPEAT IN 2 HOURS IF HEADACHE PERSISTS. 10 tablet 1  . traMADol (ULTRAM) 50 MG tablet TAKE ONE TABLET BY MOUTH EVERY SIX HOURS AS NEEDED 60 tablet 1  . loratadine (CLARITIN) 10 MG tablet Take 10 mg by mouth daily.      . montelukast (SINGULAIR) 10 MG tablet Take 1 tablet (10 mg total) by mouth at bedtime. (Patient not taking: Reported on 03/01/2016) 30 tablet 3  . vardenafil (LEVITRA) 10 MG tablet Take 1 tablet (10 mg total) by mouth daily as needed. As directed (Patient not taking: Reported on 03/01/2016) 10 tablet 4   No current facility-administered medications on file prior to visit.     BP 128/74   Temp 98.4 F (36.9 C) (Oral)   Ht 5' 9.5" (1.765 m)   Wt 168 lb 14.4 oz (76.6 kg)   BMI 24.58  kg/m       Objective:   Physical Exam  Constitutional: He is oriented to person, place, and time. He appears well-developed and well-nourished. No distress.  Cardiovascular: Normal rate, regular rhythm, normal heart sounds and intact distal pulses.  Exam reveals no gallop.   No murmur heard. Pulmonary/Chest: Effort normal and breath sounds normal. No respiratory distress. He has no wheezes. He has no rales. He exhibits no tenderness.  Neurological: He is alert and oriented to person, place, and time.  Skin: Skin is warm and dry. Rash (Diffuse and confluent palpuable purpuric plaques scattered throughout body ) noted. He is not diaphoretic. No erythema. No pallor.  Psychiatric: He has a  normal mood and affect. His behavior is normal. Judgment and thought content normal.  Vitals reviewed.     Assessment & Plan:  1. Vasculitis of skin - Possibly folliculitis but doubtful due to presentation  - CBC with Differential/Platelet - Hepatic function panel - B. burgdorfi Antibody - Rickettsial Fever Abs - Sedimentation Rate - C-reactive Protein - Creatinine, Serum - Cryoglobulin, Ql, Serum - Urinalysis, microscopic only - ANA - Culture, blood (single) w Reflex to ID Panel - RPR - Acute Hep Panel & Hep B Surface Ab - Will follow up with patient once labs have returned - Consider referral to Rheumatology or Dermatology  - Consider steroid therapy.   Dorothyann Peng, NP

## 2016-03-02 LAB — LYME AB/WESTERN BLOT REFLEX

## 2016-03-02 LAB — ACUTE HEP PANEL AND HEP B SURFACE AB
HCV Ab: NEGATIVE
HEP B C IGM: NONREACTIVE
HEP B S AG: NEGATIVE
Hep A IgM: NONREACTIVE
Hep B S Ab: NEGATIVE

## 2016-03-02 LAB — ANA: ANA: NEGATIVE

## 2016-03-02 LAB — RPR

## 2016-03-05 ENCOUNTER — Telehealth: Payer: Self-pay | Admitting: Adult Health

## 2016-03-05 NOTE — Telephone Encounter (Signed)
Updated patient on labs. He reports that his rash has started to resolve. He is going to follow up with dermatology .

## 2016-03-08 LAB — CULTURE, BLOOD (SINGLE): ORGANISM ID, BACTERIA: NO GROWTH

## 2016-03-17 ENCOUNTER — Other Ambulatory Visit: Payer: Self-pay | Admitting: Internal Medicine

## 2016-03-29 ENCOUNTER — Other Ambulatory Visit: Payer: Self-pay | Admitting: Internal Medicine

## 2016-04-03 ENCOUNTER — Telehealth: Payer: Self-pay | Admitting: Internal Medicine

## 2016-04-03 MED ORDER — TRAMADOL HCL 50 MG PO TABS
50.0000 mg | ORAL_TABLET | Freq: Four times a day (QID) | ORAL | 1 refills | Status: DC | PRN
Start: 2016-04-03 — End: 2016-06-04

## 2016-04-03 NOTE — Telephone Encounter (Signed)
Pt  request refill  traMADol (ULTRAM) 50 MG tablet  [facility]/ [address]

## 2016-04-03 NOTE — Telephone Encounter (Signed)
Spoke to pt, told him Rx called into pharmacy as requested and he needs to schedule physical with Dr.K. Pt verbalized understanding.

## 2016-04-06 ENCOUNTER — Other Ambulatory Visit: Payer: Self-pay | Admitting: Internal Medicine

## 2016-04-23 ENCOUNTER — Other Ambulatory Visit: Payer: Self-pay | Admitting: Internal Medicine

## 2016-05-11 ENCOUNTER — Encounter: Payer: Self-pay | Admitting: Gastroenterology

## 2016-06-04 ENCOUNTER — Other Ambulatory Visit: Payer: Self-pay | Admitting: Internal Medicine

## 2016-06-17 ENCOUNTER — Other Ambulatory Visit: Payer: Self-pay | Admitting: Internal Medicine

## 2016-07-02 ENCOUNTER — Other Ambulatory Visit (INDEPENDENT_AMBULATORY_CARE_PROVIDER_SITE_OTHER): Payer: No Typology Code available for payment source

## 2016-07-02 DIAGNOSIS — R7989 Other specified abnormal findings of blood chemistry: Secondary | ICD-10-CM | POA: Diagnosis not present

## 2016-07-02 DIAGNOSIS — Z0000 Encounter for general adult medical examination without abnormal findings: Secondary | ICD-10-CM

## 2016-07-02 LAB — LDL CHOLESTEROL, DIRECT: LDL DIRECT: 79 mg/dL

## 2016-07-02 LAB — BASIC METABOLIC PANEL
BUN: 19 mg/dL (ref 6–23)
CHLORIDE: 101 meq/L (ref 96–112)
CO2: 27 mEq/L (ref 19–32)
CREATININE: 0.97 mg/dL (ref 0.40–1.50)
Calcium: 9.7 mg/dL (ref 8.4–10.5)
GFR: 82.65 mL/min (ref >60.00)
Glucose, Bld: 94 mg/dL (ref 70–99)
POTASSIUM: 4.5 meq/L (ref 3.5–5.1)
Sodium: 138 mEq/L (ref 135–145)

## 2016-07-02 LAB — POC URINALSYSI DIPSTICK (AUTOMATED)
Bilirubin, UA: NEGATIVE
Glucose, UA: NEGATIVE
Ketones, UA: NEGATIVE
LEUKOCYTES UA: NEGATIVE
Nitrite, UA: NEGATIVE
PH UA: 5
PROTEIN UA: NEGATIVE
Spec Grav, UA: 1.01
UROBILINOGEN UA: 0.2

## 2016-07-02 LAB — CBC WITH DIFFERENTIAL/PLATELET
BASOS PCT: 0.5 % (ref 0.0–3.0)
Basophils Absolute: 0 10*3/uL (ref 0.0–0.1)
EOS ABS: 0.4 10*3/uL (ref 0.0–0.7)
Eosinophils Relative: 4.2 % (ref 0.0–5.0)
HEMATOCRIT: 44.5 % (ref 39.0–52.0)
HEMOGLOBIN: 15.1 g/dL (ref 13.0–17.0)
LYMPHS PCT: 21.3 % (ref 12.0–46.0)
Lymphs Abs: 2 10*3/uL (ref 0.7–4.0)
MCHC: 33.9 g/dL (ref 30.0–36.0)
MCV: 93.4 fl (ref 78.0–100.0)
Monocytes Absolute: 0.9 10*3/uL (ref 0.1–1.0)
Monocytes Relative: 9.6 % (ref 3.0–12.0)
Neutro Abs: 6 10*3/uL (ref 1.4–7.7)
Neutrophils Relative %: 64.4 % (ref 43.0–77.0)
Platelets: 291 10*3/uL (ref 150.0–400.0)
RBC: 4.76 Mil/uL (ref 4.22–5.81)
RDW: 13.5 % (ref 11.5–15.5)
WBC: 9.3 10*3/uL (ref 4.0–10.5)

## 2016-07-02 LAB — LIPID PANEL
CHOL/HDL RATIO: 4
Cholesterol: 177 mg/dL (ref 0–200)
HDL: 45.3 mg/dL (ref >39.00)
NONHDL: 131.25
TRIGLYCERIDES: 231 mg/dL — AB (ref 0.0–149.0)
VLDL: 46.2 mg/dL — ABNORMAL HIGH (ref 0.0–40.0)

## 2016-07-02 LAB — HEPATIC FUNCTION PANEL
ALK PHOS: 67 U/L (ref 39–117)
ALT: 27 U/L (ref 0–53)
AST: 21 U/L (ref 0–37)
Albumin: 4.7 g/dL (ref 3.5–5.2)
BILIRUBIN DIRECT: 0.1 mg/dL (ref 0.0–0.3)
BILIRUBIN TOTAL: 0.5 mg/dL (ref 0.2–1.2)
TOTAL PROTEIN: 7.4 g/dL (ref 6.0–8.3)

## 2016-07-02 LAB — TSH: TSH: 2.17 u[IU]/mL (ref 0.35–4.50)

## 2016-07-02 LAB — PSA: PSA: 1.36 ng/mL (ref 0.10–4.00)

## 2016-07-03 ENCOUNTER — Ambulatory Visit (AMBULATORY_SURGERY_CENTER): Payer: Self-pay

## 2016-07-03 VITALS — Ht 70.5 in | Wt 175.4 lb

## 2016-07-03 DIAGNOSIS — Z1211 Encounter for screening for malignant neoplasm of colon: Secondary | ICD-10-CM

## 2016-07-03 MED ORDER — NA SULFATE-K SULFATE-MG SULF 17.5-3.13-1.6 GM/177ML PO SOLN: ORAL | 0 refills | Status: DC

## 2016-07-03 NOTE — Progress Notes (Signed)
Per pt, no allergies to soy or egg products.Pt not taking any weight loss meds or using  O2 at home.  Pt states he is hard to wake up past certain sedation!

## 2016-07-09 ENCOUNTER — Encounter: Payer: Self-pay | Admitting: Gastroenterology

## 2016-07-10 ENCOUNTER — Encounter: Payer: Self-pay | Admitting: Internal Medicine

## 2016-07-10 ENCOUNTER — Ambulatory Visit (INDEPENDENT_AMBULATORY_CARE_PROVIDER_SITE_OTHER): Payer: No Typology Code available for payment source | Admitting: Internal Medicine

## 2016-07-10 VITALS — BP 130/62 | HR 85 | Temp 98.0°F | Ht 70.0 in | Wt 178.4 lb

## 2016-07-10 DIAGNOSIS — Z0000 Encounter for general adult medical examination without abnormal findings: Secondary | ICD-10-CM

## 2016-07-10 MED ORDER — TESTOSTERONE 20.25 MG/ACT (1.62%) TD GEL: 2.0000 "application " | TRANSDERMAL | 4 refills | Status: DC

## 2016-07-10 NOTE — Progress Notes (Signed)
Pre visit review using our clinic review tool, if applicable. No additional management support is needed unless otherwise documented below in the visit note. 

## 2016-07-10 NOTE — Progress Notes (Signed)
Subjective:    Patient ID: Joseph Gill, male    DOB: 1950-12-05, 66 y.o.   MRN: [id number]  HPI  66 year old patient who is seen today for a preventive health examination He continues to do well.  He does have a history of testosterone deficiency but has not been on supplements for some time.  He does complain of diminished libido, poor energy, as well as ED.  He has a history of dyslipidemia which has been treated with statin therapy.  His headaches have been stable.  Past Medical History:  Diagnosis Date  . ALLERGIC RHINITIS 03/26/2007  . Allergy   . Back pain    knee and leg pain  . Chronically dry eyes, bilateral   . Complication of anesthesia    Per pt, hard to wake up past certain sedation!  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 Hyperlipidemia   . Irregular heart beat   . MIGRAINE HEADACHE 11/03/2008     Social History   Social History  . Marital status: Married    Spouse name: N/A  . Number of children: N/A  . Years of education: N/A   Occupational History  . Not on file.   Social History Main Topics  . Smoking status: Never Smoker  . Smokeless tobacco: Never Used  . Alcohol use Yes  . Drug use: No  . Sexual activity: Not on file   Other Topics Concern  . Not on file   Social History Narrative  . No narrative on file    Past Surgical History:  Procedure Laterality Date  . CATARACT EXTRACTION, BILATERAL  2015  . NASAL SEPTUM SURGERY    . NASAL SINUS SURGERY    . TONSILLECTOMY      No family history on file.  Allergies  Allergen Reactions  . Erythromycin     Feels terrible!    Current Outpatient Prescriptions on File Prior to Visit  Medication Sig Dispense Refill  . acetaminophen (TYLENOL) 500 MG tablet Take 500 mg by mouth as needed.    Marland Kitchen amitriptyline (ELAVIL) 25 MG tablet TAKE ONE TABLET BY MOUTH AT BEDTIME 90 tablet 1  . atorvastatin (LIPITOR) 40 MG tablet TAKE ONE TABLET BY MOUTH ONE TIME DAILY 90 tablet 0  .  diphenhydrAMINE (BENADRYL) 25 mg capsule Take 25 mg by mouth as needed.    Marland Kitchen FLUARIX QUADRIVALENT 0.5 ML injection 0.5 Doses.    Marland Kitchen ibuprofen (ADVIL,MOTRIN) 200 MG tablet Take 200 mg by mouth as needed.    Marland Kitchen Lifitegrast (XIIDRA) 5 % SOLN Apply to eye. Use one drop each eye twice a day    . loratadine (CLARITIN) 10 MG tablet Take 10 mg by mouth as needed.     . LUTEIN PO Take 2 mg by mouth daily.    . mometasone (NASONEX) 50 MCG/ACT nasal spray PLACE 2 SPRAYS INTO THE NOSE  DAILY. 17 g 2  . montelukast (SINGULAIR) 10 MG tablet Take 1 tablet (10 mg total) by mouth at bedtime. 30 tablet 3  . Multiple Vitamin (MULTIVITAMIN) tablet Take 1 tablet by mouth daily.    . Na Sulfate-K Sulfate-Mg Sulf (SUPREP BOWEL PREP KIT) 17.5-3.13-1.6 GM/180ML SOLN Suprep as directed / no substitutions 354 mL 0  . Naproxen Sodium (ALEVE PO) Take 220 mg by mouth as needed.    . phenylephrine (NEO-SYNEPHRINE) 0.25 % nasal spray Place 1 spray into both nostrils as needed for congestion.    . pseudoephedrine (SUDAFED) 30 MG tablet Take 30 mg  by mouth as needed for congestion.    . SUMAtriptan (IMITREX) 100 MG tablet TAKE 1 TABLET BY MOUTH AT ONSET OF MIGRAINE OR HEADACHE. MAY REPEAT IN 2 HOURS IF HEADACHE PERSISTS. 10 tablet 1  . traMADol (ULTRAM) 50 MG tablet TAKE 1 TABLET BY MOUTH EVERY 6 HOURS AS NEEDED 60 tablet 1  . vardenafil (LEVITRA) 10 MG tablet Take 1 tablet (10 mg total) by mouth daily as needed. As directed 10 tablet 4   No current facility-administered medications on file prior to visit.     BP 130/62 (BP Location: Right Arm, Patient Position: Standing, Cuff Size: Normal)   Pulse 85   Temp 98 F (36.7 C) (Oral)   Ht _0  (1.778 m)   Wt 178 lb 6.4 oz (80.9 kg)   SpO2 96%   BMI 25.60 kg/m     Review of Systems  Constitutional: Positive for fatigue. Negative for activity change, appetite change, chills and fever.  HENT: Negative for congestion, dental problem, ear pain, hearing loss, mouth sores,  rhinorrhea, sinus pressure, sneezing, tinnitus, trouble swallowing and voice change.   Eyes: Negative for photophobia, pain, redness and visual disturbance.  Respiratory: Positive for cough. Negative for apnea, choking, chest tightness, shortness of breath and wheezing.   Cardiovascular: Negative for chest pain, palpitations and leg swelling.  Gastrointestinal: Negative for abdominal distention, abdominal pain, anal bleeding, blood in stool, constipation, diarrhea, nausea, rectal pain and vomiting.  Genitourinary: Negative for decreased urine volume, difficulty urinating, discharge, dysuria, flank pain, frequency, genital sores, hematuria, penile swelling, scrotal swelling, testicular pain and urgency.  Musculoskeletal: Negative for arthralgias, back pain, gait problem, joint swelling, myalgias, neck pain and neck stiffness.  Skin: Negative for color change, rash and wound.  Neurological: Negative for dizziness, tremors, seizures, syncope, facial asymmetry, speech difficulty, weakness, light-headedness, numbness and headaches.  Hematological: Negative for adenopathy. Does not bruise/bleed easily.  Psychiatric/Behavioral: Negative for agitation, behavioral problems, confusion, decreased concentration, dysphoric mood, hallucinations, self-injury, sleep disturbance and suicidal ideas. The patient is not nervous/anxious.        Objective:   Physical Exam  Constitutional: He appears well-developed and well-nourished.  Blood pressure well controlled  HENT:  Head: Normocephalic and atraumatic.  Right Ear: External ear normal.  Left Ear: External ear normal.  Nose: Nose normal.  Mouth/Throat: Oropharynx is clear and moist.  Eyes: Conjunctivae and EOM are normal. Pupils are equal, round, and reactive to light. No scleral icterus.  Neck: Normal range of motion. Neck supple. No JVD present. No thyromegaly present.  Cardiovascular: Regular rhythm, normal heart sounds and intact distal pulses.  Exam  reveals no gallop and no friction rub.   No murmur heard. Pulmonary/Chest: Effort normal and breath sounds normal. He exhibits no tenderness.  Abdominal: Soft. Bowel sounds are normal. He exhibits no distension and no mass. There is no tenderness.  Genitourinary: Penis normal.  Genitourinary Comments: prostate examination deferred today since patient will have colonoscopy next week  Musculoskeletal: Normal range of motion. He exhibits no edema or tenderness.  Lymphadenopathy:    He has no cervical adenopathy.  Neurological: He is alert. He has normal reflexes. No cranial nerve deficit. Coordination normal.  Skin: Skin is warm and dry. No rash noted.  Psychiatric: He has a normal mood and affect. His behavior is normal.          Assessment & Plan:   Preventive health examination.  10 year colonoscopy next week as scheduled Testosterone deficiency.  This has been documented in  the past.  Patient hasED and libido issues.  Will resume supplemental therapy Dyslipidemia.  Continue atorvastatin  No other change in medical regimen Follow-up 6 months or as needed  [employer]

## 2016-07-10 NOTE — Patient Instructions (Signed)
It is important that you exercise regularly, at least 20 minutes 3 to 4 times per week.  If you develop chest pain or shortness of breath seek  medical attention.  Continue heart healthy diet  Return in one year or as needed  Schedule your colonoscopy to help detect colon cancer.

## 2016-07-17 ENCOUNTER — Ambulatory Visit (AMBULATORY_SURGERY_CENTER): Payer: No Typology Code available for payment source | Admitting: Gastroenterology

## 2016-07-17 ENCOUNTER — Encounter: Payer: Self-pay | Admitting: Gastroenterology

## 2016-07-17 VITALS — BP 123/72 | HR 78 | Temp 97.7°F | Resp 24 | Ht 70.5 in | Wt 175.0 lb

## 2016-07-17 DIAGNOSIS — D122 Benign neoplasm of ascending colon: Secondary | ICD-10-CM

## 2016-07-17 DIAGNOSIS — Z1212 Encounter for screening for malignant neoplasm of rectum: Secondary | ICD-10-CM | POA: Diagnosis not present

## 2016-07-17 DIAGNOSIS — Z1211 Encounter for screening for malignant neoplasm of colon: Secondary | ICD-10-CM | POA: Diagnosis not present

## 2016-07-17 MED ORDER — SODIUM CHLORIDE 0.9 % IV SOLN: 500.0000 mL | INTRAVENOUS | Status: DC

## 2016-07-17 NOTE — Op Note (Signed)
[name] Patient Name: Joseph Gill Procedure Date: 07/17/2016 11:00 AM MRN: [id number] Endoscopist: Mallie Mussel L. Loletha Gill , MD Age: 66 Referring MD:  Date of Birth: 10-27-1950 Gender: Male Account #: [id number] Procedure:                Colonoscopy Indications:              Screening for colorectal malignant neoplasm (no                            polyps on 10/07 colonoscopy) Medicines:                Monitored Anesthesia Care Procedure:                Pre-Anesthesia Assessment:                           - Prior to the procedure, a History and Physical                            was performed, and patient medications and                            allergies were reviewed. The patient's tolerance of                            previous anesthesia was also reviewed. The risks                            and benefits of the procedure and the sedation                            options and risks were discussed with the patient.                            All questions were answered, and informed consent                            was obtained. Prior Anticoagulants: The patient has                            taken no previous anticoagulant or antiplatelet                            agents. ASA Grade Assessment: II - A patient with                            mild systemic disease. After reviewing the risks                            and benefits, the patient was deemed in                            satisfactory condition to undergo the procedure.  After obtaining informed consent, the colonoscope                            was passed under direct vision. Throughout the                            procedure, the patient's blood pressure, pulse, and                            oxygen saturations were monitored continuously. The                            CF-HQ190 was introduced through the anus and                            advanced to the the cecum, identified  by                            appendiceal orifice and ileocecal valve. The                            colonoscopy was performed without difficulty. The                            patient tolerated the procedure well. The quality                            of the bowel preparation was good. The ileocecal                            valve, appendiceal orifice, and rectum were                            photographed. The quality of the bowel preparation                            was evaluated using the BBPS [facility] Bowel                            Preparation Scale) with scores of: Right Colon = 2,                            Transverse Colon = 2 and Left Colon = 2. The total                            BBPS score equals 6. The bowel preparation used was                            SUPREP. Scope In: 11:10:33 AM Scope Out: 11:24:17 AM Scope Withdrawal Time: 0 hours 10 minutes 8 seconds  Total Procedure Duration: 0 hours 13 minutes 44 seconds  Findings:                 The perianal and digital rectal  examinations were                            normal.                           A 4 mm polyp was found in the mid ascending colon.                            The polyp was sessile. The polyp was removed with a                            cold snare. Resection and retrieval were complete.                           Multiple medium-mouthed diverticula were found in                            the left colon.                           Internal hemorrhoids were found during retroflexion                            and during anoscopy. The hemorrhoids were                            medium-sized and Grade I (internal hemorrhoids that                            do not prolapse).                           The exam was otherwise without abnormality on                            direct and retroflexion views. Complications:            No immediate complications. Estimated Blood Loss:     Estimated blood loss:  none. Impression:               - One 4 mm polyp in the mid ascending colon,                            removed with a cold snare. Resected and retrieved.                           - Diverticulosis in the left colon.                           - Internal hemorrhoids.                           - The examination was otherwise normal on direct  and retroflexion views. Recommendation:           - Patient has a contact number available for                            emergencies. The signs and symptoms of potential                            delayed complications were discussed with the                            patient. Return to normal activities tomorrow.                            Written discharge instructions were provided to the                            patient.                           - Resume previous diet.                           - Continue present medications.                           - Await pathology results.                           - Repeat colonoscopy is recommended for                            surveillance. The colonoscopy date will be                            determined after pathology results from today's                            exam become available for review. Joseph L. Loletha Carrow, MD 07/17/2016 11:31:09 AM This report has been signed electronically.

## 2016-07-17 NOTE — Progress Notes (Signed)
Called to room to assist during endoscopic procedure.  Patient ID and intended procedure confirmed with present staff. Received instructions for my participation in the procedure from the performing physician.  

## 2016-07-17 NOTE — Patient Instructions (Signed)
YOU HAD AN ENDOSCOPIC PROCEDURE TODAY AT THE [facility]:   Refer to the procedure report that was given to you for any specific questions about what was found during the examination.  If the procedure report does not answer your questions, please call your gastroenterologist to clarify.  If you requested that your care partner not be given the details of your procedure findings, then the procedure report has been included in a sealed envelope for you to review at your convenience later.  YOU SHOULD EXPECT: Some feelings of bloating in the abdomen. Passage of more gas than usual.  Walking can help get rid of the air that was put into your GI tract during the procedure and reduce the bloating. If you had a lower endoscopy (such as a colonoscopy or flexible sigmoidoscopy) you may notice spotting of blood in your stool or on the toilet paper. If you underwent a bowel prep for your procedure, you may not have a normal bowel movement for a few days.  Please Note:  You might notice some irritation and congestion in your nose or some drainage.  This is from the oxygen used during your procedure.  There is no need for concern and it should clear up in a day or so.  SYMPTOMS TO REPORT IMMEDIATELY:   Following lower endoscopy (colonoscopy or flexible sigmoidoscopy):  Excessive amounts of blood in the stool  Significant tenderness or worsening of abdominal pains  Swelling of the abdomen that is new, acute  Fever of 100F or higher   Following upper endoscopy (EGD)  Vomiting of blood or coffee ground material  New chest pain or pain under the shoulder blades  Painful or persistently difficult swallowing  New shortness of breath  Fever of 100F or higher  Black, tarry-looking stools  For urgent or emergent issues, a gastroenterologist can be reached at any hour by calling [phone].   DIET:  We do recommend a small meal at first, but then you may proceed to your regular diet.  Drink  plenty of fluids but you should avoid alcoholic beverages for 24 hours.  ACTIVITY:  You should plan to take it easy for the rest of today and you should NOT DRIVE or use heavy machinery until tomorrow (because of the sedation medicines used during the test).    FOLLOW UP: Our staff will call the number listed on your records the next business day following your procedure to check on you and address any questions or concerns that you may have regarding the information given to you following your procedure. If we do not reach you, we will leave a message.  However, if you are feeling well and you are not experiencing any problems, there is no need to return our call.  We will assume that you have returned to your regular daily activities without incident.  If any biopsies were taken you will be contacted by phone or by letter within the next 1-3 weeks.  Please call us at [phone] if you have not heard about the biopsies in 3 weeks.    SIGNATURES/CONFIDENTIALITY: You and/or your care partner have signed paperwork which will be entered into your electronic medical record.  These signatures attest to the fact that that the information above on your After Visit Summary has been reviewed and is understood.  Full responsibility of the confidentiality of this discharge information lies with you and/or your care-partner.  Polyp, diverticulosis, high fiber diet and hemorrhoid information given. 

## 2016-07-17 NOTE — Progress Notes (Signed)
Patient awakening,vss,report to rn 

## 2016-07-18 ENCOUNTER — Telehealth: Payer: Self-pay

## 2016-07-18 ENCOUNTER — Ambulatory Visit (INDEPENDENT_AMBULATORY_CARE_PROVIDER_SITE_OTHER): Payer: No Typology Code available for payment source | Admitting: Family Medicine

## 2016-07-18 ENCOUNTER — Encounter: Payer: Self-pay | Admitting: Family Medicine

## 2016-07-18 VITALS — BP 110/80 | HR 92 | Temp 99.2°F | Ht 70.5 in | Wt 176.0 lb

## 2016-07-18 DIAGNOSIS — R05 Cough: Secondary | ICD-10-CM | POA: Diagnosis not present

## 2016-07-18 DIAGNOSIS — R509 Fever, unspecified: Secondary | ICD-10-CM

## 2016-07-18 DIAGNOSIS — R059 Cough, unspecified: Secondary | ICD-10-CM

## 2016-07-18 LAB — POC INFLUENZA A&B (BINAX/QUICKVUE)
Influenza A, POC: NEGATIVE
Influenza B, POC: NEGATIVE

## 2016-07-18 MED ORDER — LEVOFLOXACIN 500 MG PO TABS: 500.0000 mg | ORAL_TABLET | Freq: Every day | ORAL | 0 refills | Status: DC

## 2016-07-18 NOTE — Telephone Encounter (Signed)
  Follow up Call-  Call back number 07/17/2016  Post procedure Call Back phone  # [phone]  Permission to leave phone message Yes  Some recent data might be hidden     Patient questions:  Do you have a fever, pain , or abdominal swelling? No. Pain Score  0 *  Have you tolerated food without any problems? Yes.    Have you been able to return to your normal activities? Yes.    Do you have any questions about your discharge instructions: Diet   No. Medications  No. Follow up visit  No.  Do you have questions or concerns about your Care? Yes.  Pt. Reports he was coming down with a cold prior to his procedure yesterday, so that is continuing to progress.  Pt. Reports that his first 2 meals yesterday went right through him.  Pt. Denies fever, pain, abd swelling, or bleeding.  Reports stools yesterday were the color of iodine, as they had been prior to his procedure.  Reviewed with pt. S/S to call about.   Told pt. To call back should he continue to be concerned or experience S/S to report following colonoscopy as outlined in AVS.  Actions: * If pain score is 4 or above: No action needed, pain <4.

## 2016-07-18 NOTE — Progress Notes (Signed)
Subjective:     Patient ID: Joseph Gill, male   DOB: 07-06-1950, 66 y.o.   MRN: [id number]  HPI Patient seen with 1 day history of fatigue, cough, headache. He's had some mild body aches. No sore throat. Denies any nausea or vomiting. Minimal nasal congestion. Question of low-grade fever earlier today but temp not taken. Colonoscopy just yesterday. Nonsmoker. No chronic lung disease.  His wife has had URI symptoms for the past week.  Denies hemoptysis or pleuritic pain. Cough is moderate in severity.  No exacerbating or alleviating factors.  Past Medical History:  Diagnosis Date  . ALLERGIC RHINITIS 03/26/2007  . Allergy   . Back pain    knee and leg pain  . Cataract   . Chronically dry eyes, bilateral   . Complication of anesthesia    Per pt, hard to wake up past certain sedation!  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 Heart murmur   . Hyperlipidemia   . Irregular heart beat   . MIGRAINE HEADACHE 11/03/2008   Past Surgical History:  Procedure Laterality Date  . CATARACT EXTRACTION, BILATERAL  2015  . NASAL SEPTUM SURGERY    . NASAL SINUS SURGERY    . TONSILLECTOMY      reports that he has never smoked. He has never used smokeless tobacco. He reports that he drinks about 0.6 oz of alcohol per week . He reports that he does not use drugs. family history includes Heart disease in his father, maternal grandfather, and mother; Lung cancer in his maternal uncle; Prostate cancer in his paternal grandfather. Allergies  Allergen Reactions  . Erythromycin     Feels terrible!     Review of Systems  Constitutional: Positive for fatigue.  HENT: Positive for congestion.   Respiratory: Positive for cough.   Cardiovascular: Negative for chest pain.  Gastrointestinal: Negative for nausea and vomiting.  Genitourinary: Negative for dysuria.  Neurological: Positive for headaches. Negative for weakness.  Psychiatric/Behavioral: Negative for confusion.       Objective:   Physical Exam  Constitutional: He is oriented to person, place, and time. He appears well-developed and well-nourished.  HENT:  Right Ear: External ear normal.  Left Ear: External ear normal.  Mouth/Throat: Oropharynx is clear and moist.  Neck: Neck supple.  Cardiovascular: Normal rate and regular rhythm.   Pulmonary/Chest: Effort normal.  Patient has rales in the left base. Normal respiratory rate. Pulse oxygen 94%. Right lung is clear anteriorly lung fields are clear  Musculoskeletal: He exhibits no edema.  Lymphadenopathy:    He has no cervical adenopathy.  Neurological: He is alert and oriented to person, place, and time.  Skin: No rash noted.       Assessment:     Cough with probable/subjective fever. Patient has asymmetric lung exam with rales left base. Rule out community-acquired pneumonia. He is nontoxic in appearance and in no respiratory distress.    Plan:     -Obtain chest x-ray. He will go tomorrow to [facility] for that -Start Levaquin 500 mgs once daily for 10 days -F/U immediately for any dyspnea, vomiting, or other concerns -He is encouraged to stay well hydrated.  Joseph Post MD [facility]

## 2016-07-18 NOTE — Telephone Encounter (Signed)
Received PA request from [facility] for Androgel pump. PA submitted & is pending. Key: [id number]

## 2016-07-18 NOTE — Patient Instructions (Signed)
Go for CXR tomorrow Follow up for any fever or increased shortness of breath

## 2016-07-18 NOTE — Progress Notes (Signed)
Pre visit review using our clinic review tool, if applicable. No additional management support is needed unless otherwise documented below in the visit note. 

## 2016-07-19 ENCOUNTER — Ambulatory Visit (INDEPENDENT_AMBULATORY_CARE_PROVIDER_SITE_OTHER)
Admission: RE | Admit: 2016-07-19 | Discharge: 2016-07-19 | Disposition: A | Discharge status: Home / Self Care | Payer: No Typology Code available for payment source | Source: Ambulatory Visit | Attending: Family Medicine | Admitting: Family Medicine

## 2016-07-19 DIAGNOSIS — R059 Cough, unspecified: Secondary | ICD-10-CM

## 2016-07-19 DIAGNOSIS — R05 Cough: Secondary | ICD-10-CM

## 2016-07-19 NOTE — Telephone Encounter (Signed)
Pa approved, form faxed back to pharmacy. 

## 2016-07-22 ENCOUNTER — Encounter: Payer: Self-pay | Admitting: Gastroenterology

## 2016-07-29 ENCOUNTER — Other Ambulatory Visit: Payer: Self-pay | Admitting: Internal Medicine

## 2016-08-01 NOTE — Telephone Encounter (Signed)
error 

## 2016-08-27 ENCOUNTER — Other Ambulatory Visit: Payer: Self-pay | Admitting: Internal Medicine

## 2016-08-29 ENCOUNTER — Other Ambulatory Visit: Payer: Self-pay | Admitting: Internal Medicine

## 2016-08-31 ENCOUNTER — Other Ambulatory Visit: Payer: Self-pay | Admitting: Internal Medicine

## 2016-09-03 ENCOUNTER — Other Ambulatory Visit: Payer: Self-pay | Admitting: Internal Medicine

## 2016-09-05 ENCOUNTER — Other Ambulatory Visit: Payer: Self-pay | Admitting: Internal Medicine

## 2016-09-06 ENCOUNTER — Other Ambulatory Visit: Payer: Self-pay | Admitting: Internal Medicine

## 2016-09-07 ENCOUNTER — Other Ambulatory Visit: Payer: Self-pay | Admitting: Internal Medicine

## 2016-09-17 ENCOUNTER — Other Ambulatory Visit: Payer: Self-pay | Admitting: Internal Medicine

## 2016-10-02 ENCOUNTER — Other Ambulatory Visit: Payer: Self-pay | Admitting: Internal Medicine

## 2016-10-13 ENCOUNTER — Other Ambulatory Visit: Payer: Self-pay | Admitting: Internal Medicine

## 2016-12-06 ENCOUNTER — Other Ambulatory Visit: Payer: Self-pay | Admitting: Internal Medicine

## 2017-01-04 ENCOUNTER — Other Ambulatory Visit: Payer: Self-pay | Admitting: Internal Medicine

## 2017-01-22 ENCOUNTER — Other Ambulatory Visit: Payer: Self-pay | Admitting: Internal Medicine

## 2017-01-25 ENCOUNTER — Telehealth: Payer: Self-pay

## 2017-01-25 NOTE — Telephone Encounter (Signed)
PA approved, form faxed back to pharmacy. 

## 2017-01-25 NOTE — Telephone Encounter (Signed)
Received PA request for Androgel. PA submitted & pending. Key: [id number]

## 2017-03-14 ENCOUNTER — Encounter: Payer: Self-pay | Admitting: Internal Medicine

## 2017-03-27 DIAGNOSIS — Z23 Encounter for immunization: Secondary | ICD-10-CM | POA: Diagnosis not present

## 2017-04-01 ENCOUNTER — Other Ambulatory Visit: Payer: Self-pay | Admitting: Internal Medicine

## 2017-04-02 ENCOUNTER — Telehealth: Payer: Self-pay | Admitting: Internal Medicine

## 2017-04-02 MED ORDER — TRAMADOL HCL 50 MG PO TABS: 50.0000 mg | ORAL_TABLET | Freq: Four times a day (QID) | ORAL | 0 refills | Status: DC | PRN

## 2017-04-02 NOTE — Telephone Encounter (Signed)
° ° ° °  Pt request refill of the following:  traMADol (ULTRAM) 50 MG tablet   Phamacy:  [facility]   [address]

## 2017-04-02 NOTE — Telephone Encounter (Signed)
medication was called in to pharmacy.

## 2017-04-17 ENCOUNTER — Other Ambulatory Visit: Payer: Self-pay | Admitting: Internal Medicine

## 2017-04-24 DIAGNOSIS — Z23 Encounter for immunization: Secondary | ICD-10-CM | POA: Diagnosis not present

## 2017-05-28 ENCOUNTER — Other Ambulatory Visit: Payer: Self-pay | Admitting: Internal Medicine

## 2017-06-21 DIAGNOSIS — S92255A Nondisplaced fracture of navicular [scaphoid] of left foot, initial encounter for closed fracture: Secondary | ICD-10-CM | POA: Diagnosis not present

## 2017-06-27 ENCOUNTER — Other Ambulatory Visit: Payer: Self-pay | Admitting: Internal Medicine

## 2017-07-05 DIAGNOSIS — S93402A Sprain of unspecified ligament of left ankle, initial encounter: Secondary | ICD-10-CM | POA: Diagnosis not present

## 2017-07-19 DIAGNOSIS — S93402A Sprain of unspecified ligament of left ankle, initial encounter: Secondary | ICD-10-CM | POA: Diagnosis not present

## 2017-08-09 DIAGNOSIS — S93402A Sprain of unspecified ligament of left ankle, initial encounter: Secondary | ICD-10-CM | POA: Diagnosis not present

## 2017-09-04 DIAGNOSIS — S93402A Sprain of unspecified ligament of left ankle, initial encounter: Secondary | ICD-10-CM | POA: Diagnosis not present

## 2017-09-24 ENCOUNTER — Other Ambulatory Visit: Payer: Self-pay | Admitting: Internal Medicine

## 2017-10-07 ENCOUNTER — Other Ambulatory Visit: Payer: Self-pay | Admitting: Internal Medicine

## 2017-10-07 DIAGNOSIS — L851 Acquired keratosis [keratoderma] palmaris et plantaris: Secondary | ICD-10-CM | POA: Diagnosis not present

## 2017-10-08 ENCOUNTER — Other Ambulatory Visit: Payer: Self-pay | Admitting: Internal Medicine

## 2017-10-09 NOTE — Telephone Encounter (Signed)
Patient is calling to follow up on this. States the pharmacy told him it was denied. I do not show where it was. Please advise

## 2017-10-09 NOTE — Telephone Encounter (Signed)
Patient notified and scheduled an office visit for more refills. No further action needed.

## 2017-10-23 ENCOUNTER — Ambulatory Visit (INDEPENDENT_AMBULATORY_CARE_PROVIDER_SITE_OTHER): Payer: Medicare Other | Admitting: Internal Medicine

## 2017-10-23 ENCOUNTER — Encounter: Payer: Self-pay | Admitting: Internal Medicine

## 2017-10-23 VITALS — BP 110/68 | HR 74 | Temp 98.7°F | Wt 178.0 lb

## 2017-10-23 DIAGNOSIS — Z860100 Personal history of colon polyps, unspecified: Secondary | ICD-10-CM | POA: Insufficient documentation

## 2017-10-23 DIAGNOSIS — J301 Allergic rhinitis due to pollen: Secondary | ICD-10-CM | POA: Diagnosis not present

## 2017-10-23 DIAGNOSIS — G43909 Migraine, unspecified, not intractable, without status migrainosus: Secondary | ICD-10-CM

## 2017-10-23 DIAGNOSIS — E785 Hyperlipidemia, unspecified: Secondary | ICD-10-CM | POA: Diagnosis not present

## 2017-10-23 DIAGNOSIS — E349 Endocrine disorder, unspecified: Secondary | ICD-10-CM

## 2017-10-23 DIAGNOSIS — H6123 Impacted cerumen, bilateral: Secondary | ICD-10-CM

## 2017-10-23 MED ORDER — ATORVASTATIN CALCIUM 40 MG PO TABS: 40.0000 mg | ORAL_TABLET | Freq: Every day | ORAL | 2 refills | Status: DC

## 2017-10-23 MED ORDER — TESTOSTERONE 20.25 MG/ACT (1.62%) TD GEL: TRANSDERMAL | 0 refills | Status: DC

## 2017-10-23 MED ORDER — SUMATRIPTAN SUCCINATE 100 MG PO TABS: ORAL_TABLET | ORAL | 1 refills | Status: DC

## 2017-10-23 MED ORDER — TRAMADOL HCL 50 MG PO TABS: 50.0000 mg | ORAL_TABLET | Freq: Four times a day (QID) | ORAL | 0 refills | Status: DC | PRN

## 2017-10-23 MED ORDER — AMITRIPTYLINE HCL 25 MG PO TABS: 25.0000 mg | ORAL_TABLET | Freq: Every day | ORAL | 1 refills | Status: DC

## 2017-10-23 NOTE — Patient Instructions (Signed)
Return in 3 months for an annual exam with a new provider of your choice  Call or return to clinic prn if these symptoms worsen or fail to improve as anticipated.

## 2017-10-23 NOTE — Progress Notes (Signed)
Subjective:    Patient ID: Joseph Gill, male    DOB: [birth year], 67 y.o.   MRN: [id number]  HPI 67 year old patient who is seen today in follow-up.  He has not been seen in over one year. He has a history of dyslipidemia and testosterone deficiency.  He has migraine headaches and a history of allergic rhinitis.  He is complaining of decreased auditory acuity.  Basically doing well today and is seen today for medication update.  Past Medical History:  Diagnosis Date  . ALLERGIC RHINITIS 03/26/2007  . Allergy   . Back pain    knee and leg pain  . Cataract   . Chronically dry eyes, bilateral   . Complication of anesthesia    Per pt, hard to wake up past certain sedation!  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 Heart murmur   . Hyperlipidemia   . Irregular heart beat   . MIGRAINE HEADACHE 11/03/2008     Social History   Socioeconomic History  . Marital status: Married    Spouse name: Not on file  . Number of children: Not on file  . Years of education: Not on file  . Highest education level: Not on file  Occupational History  . Not on file  Social Needs  . Financial resource strain: Not on file  . Food insecurity:    Worry: Not on file    Inability: Not on file  . Transportation needs:    Medical: Not on file    Non-medical: Not on file  Tobacco Use  . Smoking status: Never Smoker  . Smokeless tobacco: Never Used  Substance and Sexual Activity  . Alcohol use: Yes    Alcohol/week: 0.6 oz    Types: 1 Glasses of wine per week  . Drug use: No  . Sexual activity: Not on file  Lifestyle  . Physical activity:    Days per week: Not on file    Minutes per session: Not on file  . Stress: Not on file  Relationships  . Social connections:    Talks on phone: Not on file    Gets together: Not on file    Attends religious service: Not on file    Active member of club or organization: Not on file    Attends meetings of clubs or organizations: Not on file    Relationship status: Not on file  . Intimate partner violence:    Fear of current or ex partner: Not on file    Emotionally abused: Not on file    Physically abused: Not on file    Forced sexual activity: Not on file  Other Topics Concern  . Not on file  Social History Narrative  . Not on file    Past Surgical History:  Procedure Laterality Date  . CATARACT EXTRACTION, BILATERAL  2015  . NASAL SEPTUM SURGERY    . NASAL SINUS SURGERY    . TONSILLECTOMY      Family History  Problem Relation Age of Onset  . Heart disease Mother   . Heart disease Father   . Lung cancer Maternal Uncle   . Heart disease Maternal Grandfather   . Prostate cancer Paternal Grandfather     Allergies  Allergen Reactions  . Erythromycin     Feels terrible!    Current Outpatient Medications on File Prior to Visit  Medication Sig Dispense Refill  . acetaminophen (TYLENOL) 500 MG tablet Take 500 mg by mouth as needed.    Marland Kitchen  amitriptyline (ELAVIL) 25 MG tablet TAKE ONE TABLET BY MOUTH AT BEDTIME 90 tablet 1  . ANDROGEL PUMP 20.25 MG/ACT (1.62%) GEL USE 2 PUMPS ON THE SKIN EVERY MORNING AS DIRECTED 75 g 0  . atorvastatin (LIPITOR) 40 MG tablet TAKE ONE TABLET BY MOUTH ONE TIME DAILY 90 tablet 0  . diphenhydrAMINE (BENADRYL) 25 mg capsule Take 25 mg by mouth as needed.    Marland Kitchen FLUARIX QUADRIVALENT 0.5 ML injection 0.5 Doses.    Marland Kitchen ibuprofen (ADVIL,MOTRIN) 200 MG tablet Take 200 mg by mouth as needed.    Marland Kitchen Lifitegrast (XIIDRA) 5 % SOLN Apply to eye. Use one drop each eye twice a day    . loratadine (CLARITIN) 10 MG tablet Take 10 mg by mouth as needed.     . LUTEIN PO Take 2 mg by mouth daily.    . mometasone (NASONEX) 50 MCG/ACT nasal spray PLACE 2 SPRAYS INTO THE NOSE  DAILY. 17 g 2  . Multiple Vitamin (MULTIVITAMIN) tablet Take 1 tablet by mouth daily.    . Naproxen Sodium (ALEVE PO) Take 220 mg by mouth as needed.    . phenylephrine (NEO-SYNEPHRINE) 0.25 % nasal spray Place 1 spray into both nostrils as  needed for congestion.    . pseudoephedrine (SUDAFED) 30 MG tablet Take 30 mg by mouth as needed for congestion.    . SUMAtriptan (IMITREX) 100 MG tablet TAKE 1 TABLET BY MOUTH AT ONSET OF MIGRAINE OR HEADACHE. MAY REPEAT IN 2 HOURS IF HEADACHE PERSISTS. 10 tablet 1  . traMADol (ULTRAM) 50 MG tablet Take 1 tablet (50 mg total) by mouth every 6 (six) hours as needed. 60 tablet 0  . vardenafil (LEVITRA) 10 MG tablet Take 1 tablet (10 mg total) by mouth daily as needed. As directed 10 tablet 4   Current Facility-Administered Medications on File Prior to Visit  Medication Dose Route Frequency Provider Last Rate Last Dose  . 0.9 %  sodium chloride infusion  500 mL Intravenous Continuous Danis, Estill Cotta III, MD        BP 110/68 (BP Location: Right Arm, Patient Position: Sitting, Cuff Size: Large)   Pulse 74   Temp 98.7 F (37.1 C) (Oral)   Wt 178 lb (80.7 kg)   SpO2 95%   BMI 25.18 kg/m      Review of Systems  Constitutional: Negative for appetite change, chills, fatigue and fever.  HENT: Positive for hearing loss. Negative for congestion, dental problem, ear pain, sore throat, tinnitus, trouble swallowing and voice change.   Eyes: Negative for pain, discharge and visual disturbance.  Respiratory: Negative for cough, chest tightness, wheezing and stridor.   Cardiovascular: Negative for chest pain, palpitations and leg swelling.  Gastrointestinal: Negative for abdominal distention, abdominal pain, blood in stool, constipation, diarrhea, nausea and vomiting.  Genitourinary: Negative for difficulty urinating, discharge, flank pain, genital sores, hematuria and urgency.  Musculoskeletal: Negative for arthralgias, back pain, gait problem, joint swelling, myalgias and neck stiffness.  Skin: Negative for rash.  Neurological: Negative for dizziness, syncope, speech difficulty, weakness, numbness and headaches.  Hematological: Negative for adenopathy. Does not bruise/bleed easily.    Psychiatric/Behavioral: Negative for behavioral problems and dysphoric mood. The patient is not nervous/anxious.        Objective:   Physical Exam  Constitutional: He is oriented to person, place, and time. He appears well-developed.  HENT:  Head: Normocephalic.  Right Ear: External ear normal.  Left Ear: External ear normal.  Bilateral cerumen impactions  Eyes: Conjunctivae  and EOM are normal.  Neck: Normal range of motion.  Cardiovascular: Normal rate and normal heart sounds.  Few bibasilar rales  Pulmonary/Chest: Breath sounds normal.  Abdominal: Bowel sounds are normal.  Musculoskeletal: Normal range of motion. He exhibits no edema or tenderness.  Neurological: He is alert and oriented to person, place, and time.  Psychiatric: He has a normal mood and affect. His behavior is normal.          Assessment & Plan:   Bilateral cerumen impactions.  Both canals irrigated until clear Dyslipidemia.  Continue atorvastatin Migraine headaches.  Medications updated Testosterone deficiency History of colonic polyps.  Needs follow-up colonoscopy in 4 years  Patient will establish with a new physician of his choice and have a complete examination performed in 3 months  Ariyannah Pauling Pilar Plate

## 2017-12-19 ENCOUNTER — Other Ambulatory Visit: Payer: Self-pay | Admitting: Internal Medicine

## 2018-01-13 ENCOUNTER — Other Ambulatory Visit: Payer: Self-pay | Admitting: Internal Medicine

## 2018-02-05 ENCOUNTER — Ambulatory Visit: Payer: Medicare Other | Admitting: Family Medicine

## 2018-02-06 ENCOUNTER — Ambulatory Visit: Payer: Medicare Other | Admitting: Family Medicine

## 2018-02-13 DIAGNOSIS — L820 Inflamed seborrheic keratosis: Secondary | ICD-10-CM | POA: Diagnosis not present

## 2018-03-27 ENCOUNTER — Ambulatory Visit: Payer: Medicare Other | Admitting: Family Medicine

## 2018-03-31 DIAGNOSIS — Z23 Encounter for immunization: Secondary | ICD-10-CM | POA: Diagnosis not present

## 2018-04-01 ENCOUNTER — Encounter: Payer: Medicare Other | Admitting: Family Medicine

## 2018-04-10 ENCOUNTER — Ambulatory Visit (INDEPENDENT_AMBULATORY_CARE_PROVIDER_SITE_OTHER): Payer: Medicare Other | Admitting: Family Medicine

## 2018-04-10 ENCOUNTER — Encounter: Payer: Self-pay | Admitting: Family Medicine

## 2018-04-10 VITALS — BP 102/70 | HR 84 | Temp 98.2°F | Ht 70.0 in | Wt 178.0 lb

## 2018-04-10 DIAGNOSIS — E785 Hyperlipidemia, unspecified: Secondary | ICD-10-CM

## 2018-04-10 DIAGNOSIS — M25562 Pain in left knee: Secondary | ICD-10-CM

## 2018-04-10 DIAGNOSIS — E349 Endocrine disorder, unspecified: Secondary | ICD-10-CM | POA: Diagnosis not present

## 2018-04-10 DIAGNOSIS — J302 Other seasonal allergic rhinitis: Secondary | ICD-10-CM

## 2018-04-10 DIAGNOSIS — Z8669 Personal history of other diseases of the nervous system and sense organs: Secondary | ICD-10-CM

## 2018-04-10 DIAGNOSIS — M25561 Pain in right knee: Secondary | ICD-10-CM | POA: Diagnosis not present

## 2018-04-10 DIAGNOSIS — G8929 Other chronic pain: Secondary | ICD-10-CM

## 2018-04-10 LAB — CBC WITH DIFFERENTIAL/PLATELET
BASOS PCT: 0.7 % (ref 0.0–3.0)
Basophils Absolute: 0 10*3/uL (ref 0.0–0.1)
EOS ABS: 0.2 10*3/uL (ref 0.0–0.7)
Eosinophils Relative: 3.4 % (ref 0.0–5.0)
HCT: 45.1 % (ref 39.0–52.0)
HEMOGLOBIN: 15.1 g/dL (ref 13.0–17.0)
LYMPHS ABS: 1.2 10*3/uL (ref 0.7–4.0)
Lymphocytes Relative: 16.9 % (ref 12.0–46.0)
MCHC: 33.5 g/dL (ref 30.0–36.0)
MCV: 94.5 fl (ref 78.0–100.0)
MONO ABS: 0.7 10*3/uL (ref 0.1–1.0)
Monocytes Relative: 10.3 % (ref 3.0–12.0)
NEUTROS PCT: 68.7 % (ref 43.0–77.0)
Neutro Abs: 4.9 10*3/uL (ref 1.4–7.7)
Platelets: 300 10*3/uL (ref 150.0–400.0)
RBC: 4.77 Mil/uL (ref 4.22–5.81)
RDW: 13.8 % (ref 11.5–15.5)
WBC: 7.1 10*3/uL (ref 4.0–10.5)

## 2018-04-10 MED ORDER — SUMATRIPTAN SUCCINATE 100 MG PO TABS: ORAL_TABLET | ORAL | 1 refills | Status: DC

## 2018-04-10 MED ORDER — TRAMADOL HCL 50 MG PO TABS: 50.0000 mg | ORAL_TABLET | Freq: Two times a day (BID) | ORAL | 0 refills | Status: DC | PRN

## 2018-04-10 NOTE — Patient Instructions (Signed)
Migraine Headache A migraine headache is an intense, throbbing pain on one side or both sides of the head. Migraines may also cause other symptoms, such as nausea, vomiting, and sensitivity to light and noise. What are the causes? Doing or taking certain things may also trigger migraines, such as:  Alcohol.  Smoking.  Medicines, such as: ? Medicine used to treat chest pain (nitroglycerine). ? Birth control pills. ? Estrogen pills. ? Certain blood pressure medicines.  Aged cheeses, chocolate, or caffeine.  Foods or drinks that contain nitrates, glutamate, aspartame, or tyramine.  Physical activity.  Other things that may trigger a migraine include:  Menstruation.  Pregnancy.  Hunger.  Stress, lack of sleep, too much sleep, or fatigue.  Weather changes.  What increases the risk? The following factors may make you more likely to experience migraine headaches:  Age. Risk increases with age.  Family history of migraine headaches.  Being Caucasian.  Depression and anxiety.  Obesity.  Being a woman.  Having a hole in the heart (patent foramen ovale) or other heart problems.  What are the signs or symptoms? The main symptom of this condition is pulsating or throbbing pain. Pain may:  Happen in any area of the head, such as on one side or both sides.  Interfere with daily activities.  Get worse with physical activity.  Get worse with exposure to bright lights or loud noises.  Other symptoms may include:  Nausea.  Vomiting.  Dizziness.  General sensitivity to bright lights, loud noises, or smells.  Before you get a migraine, you may get warning signs that a migraine is developing (aura). An aura may include:  Seeing flashing lights or having blind spots.  Seeing bright spots, halos, or zigzag lines.  Having tunnel vision or blurred vision.  Having numbness or a tingling feeling.  Having trouble talking.  Having muscle weakness.  How is this  diagnosed? A migraine headache can be diagnosed based on:  Your symptoms.  A physical exam.  Tests, such as CT scan or MRI of the head. These imaging tests can help rule out other causes of headaches.  Taking fluid from the spine (lumbar puncture) and analyzing it (cerebrospinal fluid analysis, or CSF analysis).  How is this treated? A migraine headache is usually treated with medicines that:  Relieve pain.  Relieve nausea.  Prevent migraines from coming back.  Treatment may also include:  Acupuncture.  Lifestyle changes like avoiding foods that trigger migraines.  Follow these instructions at home: Medicines  Take over-the-counter and prescription medicines only as told by your health care provider.  Do not drive or use heavy machinery while taking prescription pain medicine.  To prevent or treat constipation while you are taking prescription pain medicine, your health care provider may recommend that you: ? Drink enough fluid to keep your urine clear or pale yellow. ? Take over-the-counter or prescription medicines. ? Eat foods that are high in fiber, such as fresh fruits and vegetables, whole grains, and beans. ? Limit foods that are high in fat and processed sugars, such as fried and sweet foods. Lifestyle  Avoid alcohol use.  Do not use any products that contain nicotine or tobacco, such as cigarettes and e-cigarettes. If you need help quitting, ask your health care provider.  Get at least 8 hours of sleep every night.  Limit your stress. General instructions   Keep a journal to find out what may trigger your migraine headaches. For example, write down: ? What you eat and   drink. ? How much sleep you get. ? Any change to your diet or medicines.  If you have a migraine: ? Avoid things that make your symptoms worse, such as bright lights. ? It may help to lie down in a dark, quiet room. ? Do not drive or use heavy machinery. ? Ask your health care provider  what activities are safe for you while you are experiencing symptoms.  Keep all follow-up visits as told by your health care provider. This is important. Contact a health care provider if:  You develop symptoms that are different or more severe than your usual migraine symptoms. Get help right away if:  Your migraine becomes severe.  You have a fever.  You have a stiff neck.  You have vision loss.  Your muscles feel weak or like you cannot control them.  You start to lose your balance often.  You develop trouble walking.  You faint. This information is not intended to replace advice given to you by your health care provider. Make sure you discuss any questions you have with your health care provider. Document Released: 06/11/2005 Document Revised: 12/30/2015 Document Reviewed: 11/28/2015 Elsevier Interactive Patient Education  2017 [employer]  Cholesterol Cholesterol is a fat. Your body needs a small amount of cholesterol. Cholesterol (plaque) may build up in your blood vessels (arteries). That makes you more likely to have a heart attack or stroke. You cannot feel your cholesterol level. Having a blood test is the only way to find out if your level is high. Keep your test results. Work with your doctor to keep your cholesterol at a good level. What do the results mean?  Total cholesterol is how much cholesterol is in your blood.  LDL is bad cholesterol. This is the type that can build up. Try to have low LDL.  HDL is good cholesterol. It cleans your blood vessels and carries LDL away. Try to have high HDL.  Triglycerides are fat that the body can store or burn for energy. What are good levels of cholesterol?  Total cholesterol below 200.  LDL below 100 is good for people who have health risks. LDL below 70 is good for people who have very high risks.  HDL above 40 is good. It is best to have HDL of 60 or higher.  Triglycerides below 150. How can I lower my  cholesterol? Diet Follow your diet program as told by your doctor.  Choose fish, white meat chicken, or [place] that is roasted or baked. Try not to eat red meat, fried foods, sausage, or lunch meats.  Eat lots of fresh fruits and vegetables.  Choose whole grains, beans, pasta, potatoes, and cereals.  Choose olive oil, corn oil, or canola oil. Only use small amounts.  Try not to eat butter, mayonnaise, shortening, or palm kernel oils.  Try not to eat foods with trans fats.  Choose low-fat or nonfat dairy foods. ? Drink skim or nonfat milk. ? Eat low-fat or nonfat yogurt and cheeses. ? Try not to drink whole milk or cream. ? Try not to eat ice cream, egg yolks, or full-fat cheeses.  Healthy desserts include angel food cake, ginger snaps, animal crackers, hard candy, popsicles, and low-fat or nonfat frozen yogurt. Try not to eat pastries, cakes, pies, and cookies.  Exercise Follow your exercise program as told by your doctor.  Be more active. Try gardening, walking, and taking the stairs.  Ask your doctor about ways that you can be more active.  Medicine  Take over-the-counter and prescription medicines only as told by your doctor. This information is not intended to replace advice given to you by your health care provider. Make sure you discuss any questions you have with your health care provider. Document Released: 09/07/2008 Document Revised: 01/11/2016 Document Reviewed: 12/22/2015 Elsevier Interactive Patient Education  2018 [place].  Knee Pain, Adult Many things can cause knee pain. The pain often goes away on its own with time and rest. If the pain does not go away, tests may be done to find out what is causing the pain. Follow these instructions at home: Activity  Rest your knee.  Do not do things that cause pain.  Avoid activities where both feet leave the ground at the same time (high-impact activities). Examples are running, jumping rope, and doing  jumping jacks. General instructions  Take medicines only as told by your doctor.  Raise (elevate) your knee when you are resting. Make sure your knee is higher than your heart.  Sleep with a pillow under your knee.  If told, put ice on the knee: ? Put ice in a plastic bag. ? Place a towel between your skin and the bag. ? Leave the ice on for 20 minutes, 2-3 times a day.  Ask your doctor if you should wear an elastic knee support.  Lose weight if you are overweight. Being overweight can make your knee hurt more.  Do not use any tobacco products. These include cigarettes, chewing tobacco, or electronic cigarettes. If you need help quitting, ask your doctor. Smoking may slow down healing. Contact a doctor if:  The pain does not stop.  The pain changes or gets worse.  You have a fever along with knee pain.  Your knee gives out or locks up.  Your knee swells, and becomes worse. Get help right away if:  Your knee feels warm.  You cannot move your knee.  You have very bad knee pain.  You have chest pain.  You have trouble breathing. Summary  Many things can cause knee pain. The pain often goes away on its own with time and rest.  Avoid activities that put stress on your knee. These include running and jumping rope.  Get help right away if you cannot move your knee, or if your knee feels warm, or if you have trouble breathing. This information is not intended to replace advice given to you by your health care provider. Make sure you discuss any questions you have with your health care provider. Document Released: 09/07/2008 Document Revised: 06/05/2016 Document Reviewed: 06/05/2016 Elsevier Interactive Patient Education  2017 [employer].

## 2018-04-10 NOTE — Progress Notes (Signed)
Subjective:    Patient ID: Joseph Gill, male    DOB: April 18, 1951, 67 y.o.   MRN: [id number]  No chief complaint on file.   HPI Patient was seen today for follow-up on chronic conditions and TOC, previously seen by Dr. Burnice Logan.  Allergies: -Taking Claritin daily  Migraines: -May have 1-2 migraine headaches per month, but also has regular headaches. -Pt will take Tylenol initially, then tramadol then sumatriptan when migraines start. -Pt says sumatriptan makes him drowsy.  Testosterone deficiency: -Patient taking androgen gel every other day. -Last CBC was January 2018.  Knee pain: -pt states he may have knee pain that wakes him up at night. -he will take a tramadol for the pain.  HLD: -Pt taking Lipitor 40 mg daily. -Denies myalgias -Not eating any fried foods  Acute concern: -Patient endorses left great toenail issue. -Patient noticed the nail was not growing as fast as others. -Notes separation of nail from cuticle. -Patient wearing Band-Aid over base of nail. -Denies bleeding, drainage, pain, injury, h/o toenail fungus.  Allergies: -Taking Claritin  Past Medical History:  Diagnosis Date  . ALLERGIC RHINITIS 03/26/2007  . Allergy   . Back pain    knee and leg pain  . Cataract   . Chronically dry eyes, bilateral   . Complication of anesthesia    Per pt, hard to wake up past certain sedation!  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 Heart murmur   . Hyperlipidemia   . Irregular heart beat   . MIGRAINE HEADACHE 11/03/2008    Allergies  Allergen Reactions  . Erythromycin     Feels terrible!    ROS General: Denies fever, chills, night sweats, changes in weight, changes in appetite HEENT: Denies headaches, ear pain, changes in vision, rhinorrhea, sore throat  +h/o migraines CV: Denies CP, palpitations, SOB, orthopnea Pulm: Denies SOB, cough, wheezing GI: Denies abdominal pain, nausea, vomiting, diarrhea, constipation GU: Denies dysuria,  hematuria, frequency, vaginal discharge Msk: Denies muscle cramps, joint pains  +knee pain Neuro: Denies weakness, numbness, tingling Skin: Denies rashes, bruising  +toe nail concern Psych: Denies depression, anxiety, hallucinations     Objective:    Blood pressure 102/70, pulse 84, temperature 98.2 F (36.8 C), temperature source Oral, height 5\' 10"  (1.778 m), weight 178 lb (80.7 kg), SpO2 96 %.   Gen. Pleasant, well-nourished, in no distress, normal affect   HEENT: [place]/AT, face symmetric, no scleral icterus, PERRLA, nares patent without drainage Lungs: no accessory muscle use, CTAB, no wheezes or rales Cardiovascular: RRR, no m/r/g, no peripheral edema. Neuro:  A&Ox3, CN II-XII intact, normal gait Skin:  Warm, no lesions/ rash  L great toe with separation of nail from cuticle with 3 mm of nail bed visible.  No drainage, erythema, increased warmth, or induration.  Nail is light yellow in color with ridges.    Wt Readings from Last 3 Encounters:  04/10/18 178 lb (80.7 kg)  10/23/17 178 lb (80.7 kg)  07/18/16 176 lb (79.8 kg)    Lab Results  Component Value Date   WBC 9.3 07/02/2016   HGB 15.1 07/02/2016   HCT 44.5 07/02/2016   PLT 291.0 07/02/2016   GLUCOSE 94 07/02/2016   CHOL 177 07/02/2016   TRIG 231.0 (H) 07/02/2016   HDL 45.30 07/02/2016   LDLDIRECT 79.0 07/02/2016   ALT 27 07/02/2016   AST 21 07/02/2016   NA 138 07/02/2016   K 4.5 07/02/2016   CL 101 07/02/2016   CREATININE 0.97 07/02/2016  BUN 19 07/02/2016   CO2 27 07/02/2016   TSH 2.17 07/02/2016   PSA 1.36 07/02/2016    Assessment/Plan:  Hx of migraines  -Discussed migraine prevention -Given handout - Plan: SUMAtriptan (IMITREX) 100 MG tablet  Testosterone deficiency  -Continue testosterone  - Plan: CBC with Differential/Platelet -will obtain Testosterone at next [facility], as lab not ordered in time to be drawn  Dyslipidemia -Discussed lifestyle modifications -Continue Lipitor 40 mg  daily  Seasonal allergies -Continue allergy medicine.  Consider switching from Claritin to another if not as effective.  Chronic pain of both knees -Patient advised to combination of amitriptyline and tramadol can increase risk of seizures. - Plan: traMADol (ULTRAM) 50 MG tablet  Follow-up PRN in the next few months  Grier Mitts, MD

## 2018-04-16 ENCOUNTER — Ambulatory Visit: Payer: Medicare Other | Admitting: Behavioral Health

## 2018-04-16 ENCOUNTER — Encounter: Payer: Medicare Other | Admitting: Family Medicine

## 2018-04-26 DIAGNOSIS — Z23 Encounter for immunization: Secondary | ICD-10-CM | POA: Diagnosis not present

## 2018-04-28 ENCOUNTER — Encounter: Payer: Self-pay | Admitting: Family Medicine

## 2018-04-28 ENCOUNTER — Ambulatory Visit: Payer: Self-pay | Admitting: *Deleted

## 2018-04-28 ENCOUNTER — Ambulatory Visit (INDEPENDENT_AMBULATORY_CARE_PROVIDER_SITE_OTHER): Payer: Medicare Other | Admitting: Family Medicine

## 2018-04-28 VITALS — BP 128/76 | HR 74 | Temp 98.4°F | Ht 70.0 in | Wt 174.0 lb

## 2018-04-28 DIAGNOSIS — J101 Influenza due to other identified influenza virus with other respiratory manifestations: Secondary | ICD-10-CM | POA: Diagnosis not present

## 2018-04-28 DIAGNOSIS — R52 Pain, unspecified: Secondary | ICD-10-CM

## 2018-04-28 DIAGNOSIS — T50A95A Adverse effect of other bacterial vaccines, initial encounter: Secondary | ICD-10-CM | POA: Diagnosis not present

## 2018-04-28 LAB — POC INFLUENZA A&B (BINAX/QUICKVUE)
Influenza A, POC: POSITIVE — AB
Influenza B, POC: NEGATIVE

## 2018-04-28 MED ORDER — OSELTAMIVIR PHOSPHATE 75 MG PO CAPS: 75.0000 mg | ORAL_CAPSULE | Freq: Two times a day (BID) | ORAL | 0 refills | Status: DC

## 2018-04-28 NOTE — Assessment & Plan Note (Signed)
Localized- advised antihistamines- see AVS. He will call me if symptoms do not improve or worsen within 2 days.

## 2018-04-28 NOTE — Assessment & Plan Note (Signed)
New- likely occurred while his immune system was compromised after receiving pnuemovax.  Hopefully he will have less severe symptoms and sequela of the flu since he has already received his influenza vaccine. Tamiflu 75 mg x 5 days. Symptomatic therapy suggested: rest, increase fluids, OTC acetaminophen and call prn if symptoms persist or worsen. Call or return to clinic prn if these symptoms worsen or fail to improve as anticipated.

## 2018-04-28 NOTE — Patient Instructions (Signed)
Great to you.  Please take Zyrtec every morning and benadryl nightly until your rash gets better.  Please take Tamiflu twice daily  X 5 days.    Influenza, Adult Influenza ("the flu") is an infection in the lungs, nose, and throat (respiratory tract). It is caused by a virus. The flu causes many common cold symptoms, as well as a high fever and body aches. It can make you feel very sick. The flu spreads easily from person to person (is contagious). Getting a flu shot (influenza vaccination) every year is the best way to prevent the flu. Follow these instructions at home:  Take over-the-counter and prescription medicines only as told by your doctor.  Use a cool mist humidifier to add moisture (humidity) to the air in your home. This can make it easier to breathe.  Rest as needed.  Drink enough fluid to keep your pee (urine) clear or pale yellow.  Cover your mouth and nose when you cough or sneeze.  Wash your hands with soap and water often, especially after you cough or sneeze. If you cannot use soap and water, use hand sanitizer.  Stay home from work or school as told by your doctor. Unless you are visiting your doctor, try to avoid leaving home until your fever has been gone for 24 hours without the use of medicine.  Keep all follow-up visits as told by your doctor. This is important. How is this prevented?  Getting a yearly (annual) flu shot is the best way to avoid getting the flu. You may get the flu shot in late summer, fall, or winter. Ask your doctor when you should get your flu shot.  Wash your hands often or use hand sanitizer often.  Avoid contact with people who are sick during cold and flu season.  Eat healthy foods.  Drink plenty of fluids.  Get enough sleep.  Exercise regularly. Contact a doctor if:  You get new symptoms.  You have: ? Chest pain. ? Watery poop (diarrhea). ? A fever.  Your cough gets worse.  You start to have more mucus.  You feel  sick to your stomach (nauseous).  You throw up (vomit). Get help right away if:  You start to be short of breath or have trouble breathing.  Your skin or nails turn a bluish color.  You have very bad pain or stiffness in your neck.  You get a sudden headache.  You get sudden pain in your face or ear.  You cannot stop throwing up. This information is not intended to replace advice given to you by your health care provider. Make sure you discuss any questions you have with your health care provider. Document Released: 03/20/2008 Document Revised: 11/17/2015 Document Reviewed: 04/05/2015 Elsevier Interactive Patient Education  2017 [employer].

## 2018-04-28 NOTE — Telephone Encounter (Signed)
Notified by [name] regarding pt appointment with [name], [name]; she said that the pt had transferred care to [name]; attempted to contact pt to verify that he indeed want to see [name], at [facility]; left message on voicemail [phone].

## 2018-04-28 NOTE — Telephone Encounter (Signed)
Pt called with complaints of reaction to pneumonia shot on 04/26/18; he says later that day his arm was sore; on 04/27/18 he had a fever (did not take temperature, but cold and achy); 04/28/18 his arm was arm is red below the injection site; he says that it a "straight line effect:; the injection was received at [facility]; recommendations made per nurse triage protocol; pt offered and accepted appointment with [name], Audrie Lia, 04/28/18 at 1000; will route to office for notification of this upcoming appointment.   Reason for Disposition . [1] Redness or red streak around the injection site AND [2] begins > 48 hours after shot AND [3] no fever  (Exception: red area < 1 inch or 2.5 cm wide)  Answer Assessment - Initial Assessment Questions 1. SYMPTOMS: "What is the main symptom?" (e.g., redness, swelling, pain)      Pain, redness 2. ONSET: "When was the vaccine (shot) given?" "How much later did the  begin?" (e.g., hours, days ago)      Received vaccine on 04/26/18; soreness 04/26/18 3. SEVERITY: "How bad is it?"      moderated 4. FEVER: "Is there a fever?" If so, ask: "What is it, how was it measured, and when did it start?"     Yes; did not measure; feels achy all over and cold 5. IMMUNIZATIONS GIVEN: "What shots have you recently received?"     Pneumonia vaccine 6. PAST REACTIONS: "Have you reacted to immunizations before?" If so, ask: "What happened?"     no 7. OTHER SYMPTOMS: "Do you have any other symptoms?"     no  Protocols used: IMMUNIZATION REACTIONS-A-AH

## 2018-04-28 NOTE — Telephone Encounter (Signed)
Notified Carol at [facility] regarding this scheduling issue.

## 2018-04-28 NOTE — Progress Notes (Signed)
Subjective:   Patient ID: Joseph Gill, male    DOB: 1951-04-21, 67 y.o.   MRN: [id number]  Joseph Gill is a pleasant 67 y.o. year old male who presents to clinic today with Generalized Body Aches (Received pneumovax 23 on 04/26/18 at [facility]. Admits to body aches, headaches. Denies fevers. He noticed redness in his right arm, site of vaccine given.)  on 04/28/2018  HPI:  Received pneumovax on 04/26/2018. That evening, he noticed a headache and severe body aches.  Not sure if he had a fever.  Has taken Tylenol since this started. This morning, he noticed a red rash on his arm where he received the injection.    Received Prevnar 13 last year. Received flu shot 4 weeks ago.   Current Outpatient Medications on File Prior to Visit  Medication Sig Dispense Refill  . acetaminophen (TYLENOL) 500 MG tablet Take 500 mg by mouth as needed.    Marland Kitchen amitriptyline (ELAVIL) 25 MG tablet TAKE 1 TABLET BY MOUTH EVERYDAY AT BEDTIME 90 tablet 0  . atorvastatin (LIPITOR) 40 MG tablet Take 1 tablet (40 mg total) by mouth daily. 90 tablet 2  . diphenhydrAMINE (BENADRYL) 25 mg capsule Take 25 mg by mouth as needed.    Marland Kitchen FLUARIX QUADRIVALENT 0.5 ML injection 0.5 Doses.    Marland Kitchen Lifitegrast (XIIDRA) 5 % SOLN Apply to eye. Use one drop each eye twice a day    . loratadine (CLARITIN) 10 MG tablet Take 10 mg by mouth as needed.     . mometasone (NASONEX) 50 MCG/ACT nasal spray PLACE 2 SPRAYS INTO THE NOSE  DAILY. 17 g 2  . Multiple Vitamin (MULTIVITAMIN) tablet Take 1 tablet by mouth daily.    . phenylephrine (NEO-SYNEPHRINE) 0.25 % nasal spray Place 1 spray into both nostrils as needed for congestion.    . pseudoephedrine (SUDAFED) 30 MG tablet Take 30 mg by mouth as needed for congestion.    . SUMAtriptan (IMITREX) 100 MG tablet TAKE 1 TABLET BY MOUTH AT ONSET OF MIGRAINE OR HEADACHE. MAY REPEAT IN 2 HOURS IF HEADACHE PERSISTS. 10 tablet 1  . Testosterone (ANDROGEL PUMP) 20.25 MG/ACT (1.62%) GEL USE 2 PUMPS  ON THE SKIN EVERY MORNING AS DIRECTED 75 g 0  . traMADol (ULTRAM) 50 MG tablet Take 1 tablet (50 mg total) by mouth every 12 (twelve) hours as needed. 60 tablet 0   No current facility-administered medications on file prior to visit.     Allergies  Allergen Reactions  . Erythromycin     Feels terrible!    Past Medical History:  Diagnosis Date  . ALLERGIC RHINITIS 03/26/2007  . Allergy   . Back pain    knee and leg pain  . Cataract   . Chronically dry eyes, bilateral   . Complication of anesthesia    Per pt, hard to wake up past certain sedation!  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 Heart murmur   . Hyperlipidemia   . Irregular heart beat   . MIGRAINE HEADACHE 11/03/2008    Past Surgical History:  Procedure Laterality Date  . CATARACT EXTRACTION, BILATERAL  2015  . NASAL SEPTUM SURGERY    . NASAL SINUS SURGERY    . TONSILLECTOMY      Family History  Problem Relation Age of Onset  . Heart disease Mother   . Heart disease Father   . Lung cancer Maternal Uncle   . Heart disease Maternal Grandfather   . Prostate cancer Paternal Grandfather  Social History   Socioeconomic History  . Marital status: Married    Spouse name: Not on file  . Number of children: Not on file  . Years of education: Not on file  . Highest education level: Not on file  Occupational History  . Not on file  Social Needs  . Financial resource strain: Not on file  . Food insecurity:    Worry: Not on file    Inability: Not on file  . Transportation needs:    Medical: Not on file    Non-medical: Not on file  Tobacco Use  . Smoking status: Never Smoker  . Smokeless tobacco: Never Used  Substance and Sexual Activity  . Alcohol use: Yes    Alcohol/week: 1.0 standard drinks    Types: 1 Glasses of wine per week  . Drug use: No  . Sexual activity: Not on file  Lifestyle  . Physical activity:    Days per week: Not on file    Minutes per session: Not on file  . Stress: Not on  file  Relationships  . Social connections:    Talks on phone: Not on file    Gets together: Not on file    Attends religious service: Not on file    Active member of club or organization: Not on file    Attends meetings of clubs or organizations: Not on file    Relationship status: Not on file  . Intimate partner violence:    Fear of current or ex partner: Not on file    Emotionally abused: Not on file    Physically abused: Not on file    Forced sexual activity: Not on file  Other Topics Concern  . Not on file  Social History Narrative  . Not on file   The PMH, PSH, Social History, Family History, Medications, and allergies have been reviewed in [facility], and have been updated if relevant.   Review of Systems  Respiratory: Negative.   Gastrointestinal: Negative.   Musculoskeletal: Positive for myalgias.  Skin: Positive for rash.  Neurological: Positive for headaches. Negative for dizziness, tremors, seizures, syncope, facial asymmetry, speech difficulty, weakness, light-headedness and numbness.  All other systems reviewed and are negative.      Objective:    BP 128/76   Pulse 74   Temp 98.4 F (36.9 C) (Oral)   Ht 5\' 10"  (1.778 m)   Wt 174 lb (78.9 kg)   SpO2 97%   BMI 24.97 kg/m    Physical Exam  Constitutional: He is oriented to person, place, and time. He appears well-developed and well-nourished. No distress.  HENT:  Head: Normocephalic and atraumatic.  Eyes: EOM are normal.  Neck: Normal range of motion. Neck supple.  Cardiovascular: Normal rate.  Pulmonary/Chest: Effort normal.  Musculoskeletal: Normal range of motion.  Neurological: He is alert and oriented to person, place, and time. No cranial nerve deficit.  Skin: He is not diaphoretic.     Psychiatric: He has a normal mood and affect. His behavior is normal. Judgment and thought content normal.  Nursing note and vitals reviewed.         Assessment & Plan:   Body aches - Plan: POC Influenza A&B  (Binax test)  Reaction to Pneumovax immunization, initial encounter No follow-ups on file.

## 2018-06-12 ENCOUNTER — Other Ambulatory Visit: Payer: Self-pay | Admitting: Internal Medicine

## 2018-07-05 ENCOUNTER — Other Ambulatory Visit: Payer: Self-pay | Admitting: Internal Medicine

## 2018-07-07 ENCOUNTER — Other Ambulatory Visit: Payer: Self-pay | Admitting: Family Medicine

## 2018-07-07 DIAGNOSIS — M25561 Pain in right knee: Principal | ICD-10-CM

## 2018-07-07 DIAGNOSIS — M25562 Pain in left knee: Principal | ICD-10-CM

## 2018-07-07 DIAGNOSIS — G8929 Other chronic pain: Secondary | ICD-10-CM

## 2018-07-07 NOTE — Telephone Encounter (Signed)
Pt LOV was 04/28/2018 and last refill was done on 04/10/2018 for 60 tablets, please advice

## 2018-07-09 NOTE — Telephone Encounter (Signed)
Requested medication (s) are due for refill today- yes  Requested medication (s) are on the active medication list -yes  Future visit scheduled -no  Last refill: 01/13/18  Notes to clinic: Patient meets protocol for refill- but Rx written by Dr Raliegh Ip. Sent for provider review  Requested Prescriptions  Pending Prescriptions Disp Refills   amitriptyline (ELAVIL) 25 MG tablet [Pharmacy Med Name: AMITRIPTYLINE HCL 25 MG TAB] 90 tablet 0    Sig: TAKE 1 TABLET BY MOUTH EVERYDAY AT BEDTIME. PLEASE SCHEDULE OFFICE VISIT FOR FUTURE REFILLS     Psychiatry:  Antidepressants - Heterocyclics (TCAs) Passed - 07/09/2018  1:33 PM      Passed - Valid encounter within last 6 months    Recent Outpatient Visits          2 months ago Influenza A   [facility], Marciano Sequin, MD   3 months ago Hx of migraines   [facility] at [place], [place]   8 months ago Non-seasonal allergic rhinitis due to pollen   [facility], Doretha Sou, MD   1 year ago [facility], Alinda Sierras, MD   1 year ago Encounter for preventive health examination   [facility], Doretha Sou, MD              Requested Prescriptions  Pending Prescriptions Disp Refills   amitriptyline (ELAVIL) 25 MG tablet [Pharmacy Med Name: AMITRIPTYLINE HCL 25 MG TAB] 90 tablet 0    Sig: TAKE 1 TABLET BY MOUTH EVERYDAY AT BEDTIME. PLEASE SCHEDULE OFFICE VISIT FOR FUTURE REFILLS     Psychiatry:  Antidepressants - Heterocyclics (TCAs) Passed - 07/09/2018  1:33 PM      Passed - Valid encounter within last 6 months    Recent Outpatient Visits          2 months ago Influenza A   [facility] [place], Marciano Sequin, MD   3 months ago Hx of migraines   Therapist, [facility] at [place], MD   8 months ago Non-seasonal allergic rhinitis due to pollen   [facility], Doretha Sou, MD   1 year ago Cough   [facility], Alinda Sierras, MD   1 year ago Encounter for preventive health examination   [facility], Doretha Sou, MD

## 2018-07-09 NOTE — Telephone Encounter (Signed)
Pt called in to check the status of refill request for Amitriptyline and also Tramadol?   Please advise.

## 2018-07-11 NOTE — Telephone Encounter (Signed)
Please Advise if ok to refill 

## 2018-08-21 ENCOUNTER — Other Ambulatory Visit: Payer: Self-pay | Admitting: Family Medicine

## 2018-08-21 DIAGNOSIS — G8929 Other chronic pain: Secondary | ICD-10-CM

## 2018-08-21 DIAGNOSIS — M25561 Pain in right knee: Principal | ICD-10-CM

## 2018-08-21 DIAGNOSIS — M25562 Pain in left knee: Principal | ICD-10-CM

## 2018-08-21 NOTE — Telephone Encounter (Signed)
Pt LOV was 04/10/2018 and last refill was 06/30/2018 for 60 tablets, please advise

## 2018-09-20 IMAGING — DX DG CHEST 2V
2 series · 2 of 2 positions shown · non-contrast
Comparison: None.

CLINICAL DATA: Productive cough, shortness of breath for 2 months

EXAM:
CHEST  2 VIEW

[chest pa]
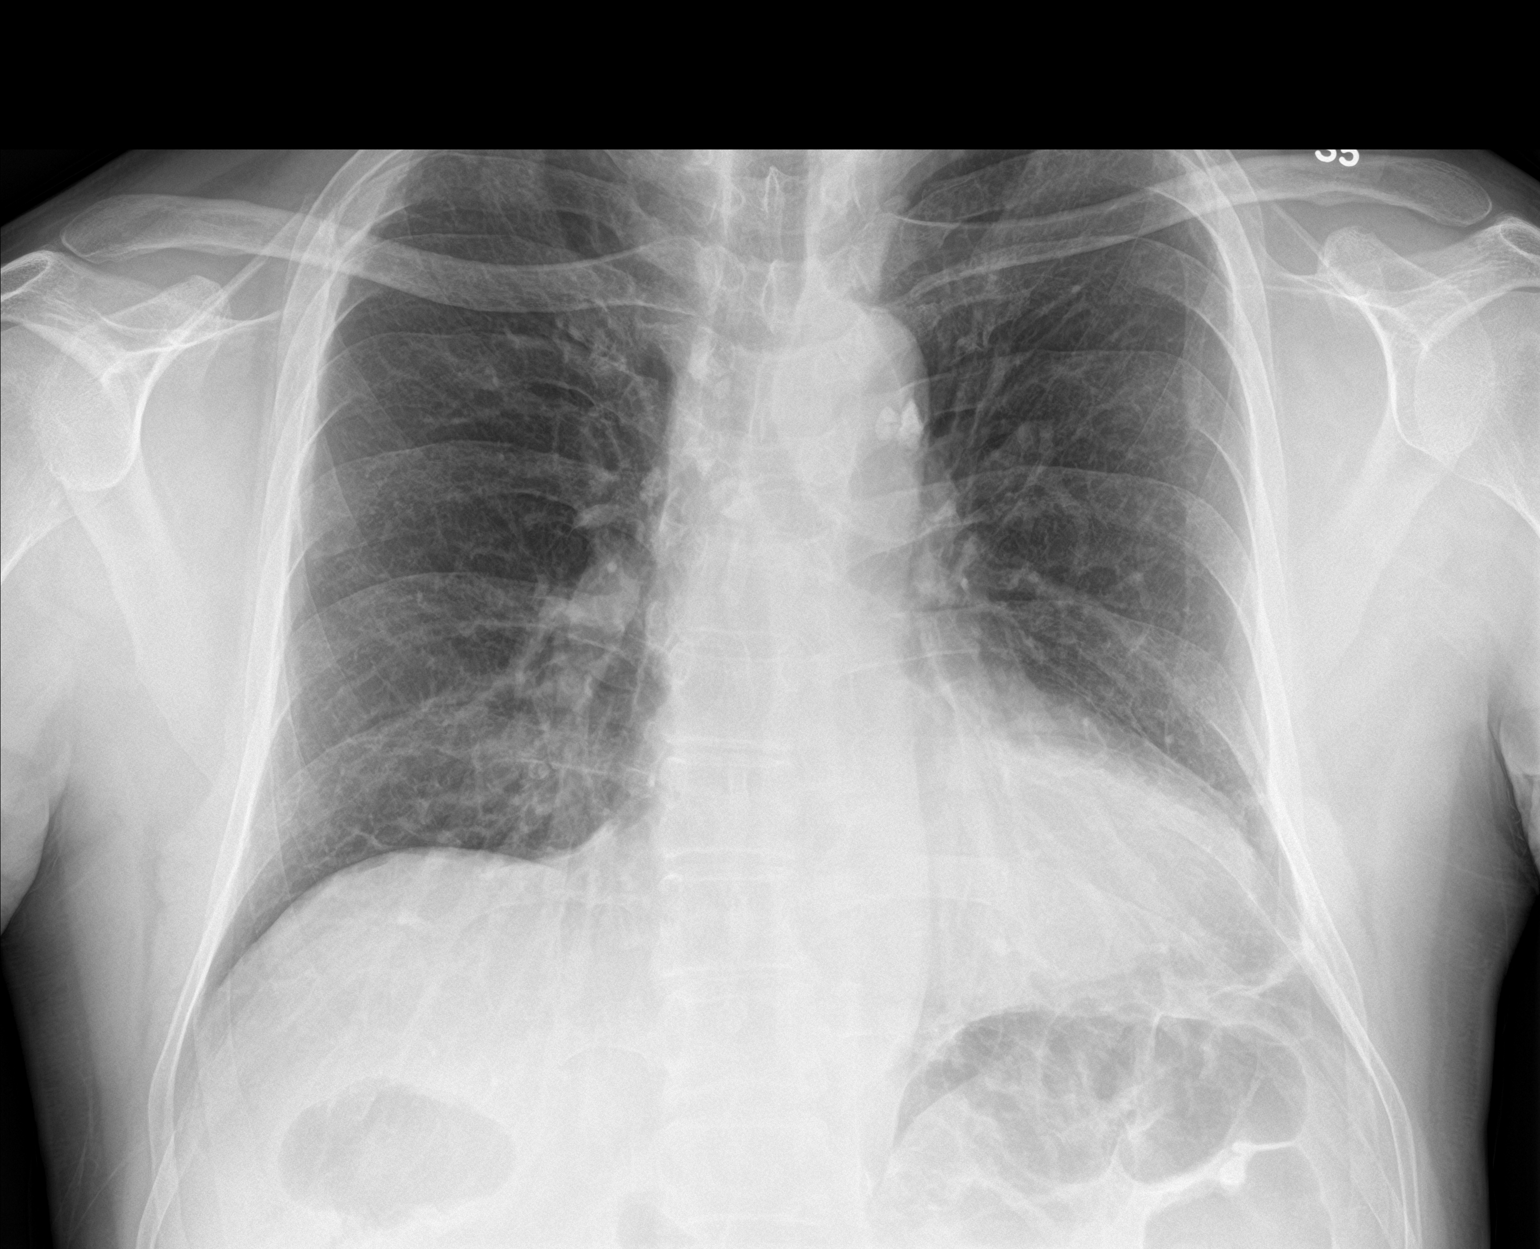

[chest lat]
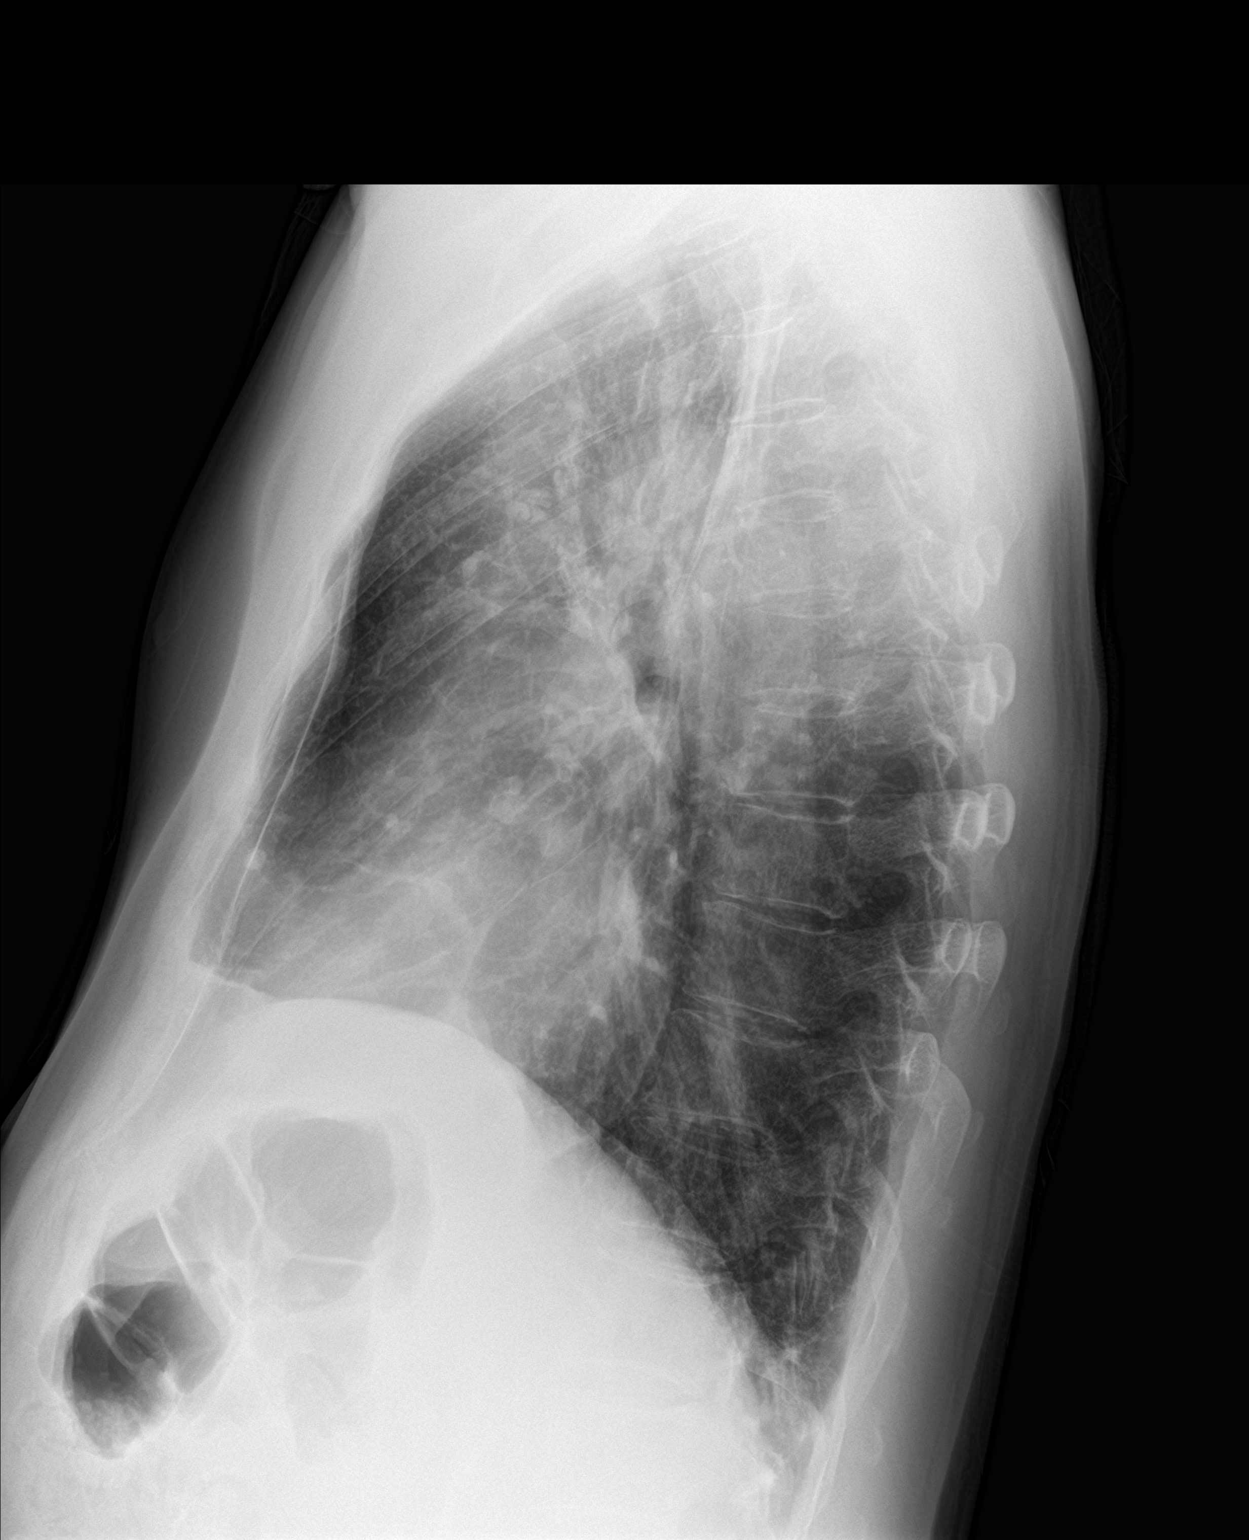

[2 of 2 positions shown; findings below may reference images not displayed]

FINDINGS: Biapical and bibasilar scarring. Mild cardiomegaly. No confluent
opacities or effusions. No acute bony abnormality.
IMPRESSION: Biapical and bibasilar scarring.  No active disease.

## 2018-09-22 ENCOUNTER — Other Ambulatory Visit: Payer: Self-pay | Admitting: Family Medicine

## 2018-09-22 NOTE — Telephone Encounter (Signed)
Last filled 10/23/17 by Dr. Raliegh Ip, now seeing Dr. Volanda Napoleon. Please advise on refill request

## 2018-09-25 MED ORDER — ATORVASTATIN CALCIUM 40 MG PO TABS: 40.0000 mg | ORAL_TABLET | Freq: Every day | ORAL | 2 refills | Status: DC

## 2018-10-01 ENCOUNTER — Other Ambulatory Visit: Payer: Self-pay | Admitting: Family Medicine

## 2018-10-01 DIAGNOSIS — G8929 Other chronic pain: Secondary | ICD-10-CM

## 2018-10-01 DIAGNOSIS — M25562 Pain in left knee: Principal | ICD-10-CM

## 2018-10-01 DIAGNOSIS — M25561 Pain in right knee: Principal | ICD-10-CM

## 2018-10-03 ENCOUNTER — Other Ambulatory Visit: Payer: Self-pay | Admitting: Family Medicine

## 2018-10-03 DIAGNOSIS — M25562 Pain in left knee: Principal | ICD-10-CM

## 2018-10-03 DIAGNOSIS — M25561 Pain in right knee: Principal | ICD-10-CM

## 2018-10-03 DIAGNOSIS — G8929 Other chronic pain: Secondary | ICD-10-CM

## 2018-10-06 NOTE — Telephone Encounter (Signed)
Pt LOV was on 04/10/2018 and last refill was done on 08/21/2018 for 60 tablets. Please advise

## 2019-04-02 ENCOUNTER — Other Ambulatory Visit: Payer: Self-pay

## 2019-04-02 ENCOUNTER — Ambulatory Visit (INDEPENDENT_AMBULATORY_CARE_PROVIDER_SITE_OTHER): Payer: Medicare Other | Admitting: Family Medicine

## 2019-04-02 ENCOUNTER — Encounter: Payer: Self-pay | Admitting: Family Medicine

## 2019-04-02 VITALS — BP 118/82 | HR 86 | Temp 97.8°F | Wt 175.0 lb

## 2019-04-02 DIAGNOSIS — E349 Endocrine disorder, unspecified: Secondary | ICD-10-CM | POA: Diagnosis not present

## 2019-04-02 DIAGNOSIS — M25562 Pain in left knee: Secondary | ICD-10-CM

## 2019-04-02 DIAGNOSIS — E785 Hyperlipidemia, unspecified: Secondary | ICD-10-CM

## 2019-04-02 DIAGNOSIS — G8929 Other chronic pain: Secondary | ICD-10-CM

## 2019-04-02 DIAGNOSIS — Z8669 Personal history of other diseases of the nervous system and sense organs: Secondary | ICD-10-CM

## 2019-04-02 DIAGNOSIS — M25561 Pain in right knee: Secondary | ICD-10-CM | POA: Diagnosis not present

## 2019-04-02 DIAGNOSIS — G4700 Insomnia, unspecified: Secondary | ICD-10-CM | POA: Diagnosis not present

## 2019-04-02 LAB — LIPID PANEL
Cholesterol: 164 mg/dL (ref 0–200)
HDL: 43.2 mg/dL (ref >39.00)
NonHDL: 120.58
Total CHOL/HDL Ratio: 4
Triglycerides: 323 mg/dL — ABNORMAL HIGH (ref 0.0–149.0)
VLDL: 64.6 mg/dL — ABNORMAL HIGH (ref 0.0–40.0)

## 2019-04-02 LAB — LDL CHOLESTEROL, DIRECT: Direct LDL: 82 mg/dL

## 2019-04-02 LAB — CBC
HCT: 44.6 % (ref 39.0–52.0)
Hemoglobin: 14.7 g/dL (ref 13.0–17.0)
MCHC: 33 g/dL (ref 30.0–36.0)
MCV: 96.4 fl (ref 78.0–100.0)
Platelets: 291 10*3/uL (ref 150.0–400.0)
RBC: 4.63 Mil/uL (ref 4.22–5.81)
RDW: 14.3 % (ref 11.5–15.5)
WBC: 6.3 10*3/uL (ref 4.0–10.5)

## 2019-04-02 LAB — TESTOSTERONE: Testosterone: 257.96 ng/dL — ABNORMAL LOW (ref 300.00–890.00)

## 2019-04-02 MED ORDER — TESTOSTERONE 20.25 MG/ACT (1.62%) TD GEL: TRANSDERMAL | 0 refills | Status: DC

## 2019-04-02 MED ORDER — ATORVASTATIN CALCIUM 40 MG PO TABS: 40.0000 mg | ORAL_TABLET | Freq: Every day | ORAL | 2 refills | Status: DC

## 2019-04-02 MED ORDER — AMITRIPTYLINE HCL 25 MG PO TABS: 25.0000 mg | ORAL_TABLET | Freq: Every day | ORAL | 3 refills | Status: DC

## 2019-04-02 MED ORDER — TRAMADOL HCL 50 MG PO TABS: ORAL_TABLET | ORAL | 1 refills | Status: DC

## 2019-04-02 MED ORDER — SUMATRIPTAN SUCCINATE 100 MG PO TABS: ORAL_TABLET | ORAL | 1 refills | Status: DC

## 2019-04-02 NOTE — Patient Instructions (Signed)
Migraine Headache A migraine headache is an intense, throbbing pain on one side or both sides of the head. Migraine headaches may also cause other symptoms, such as nausea, vomiting, and sensitivity to light and noise. A migraine headache can last from 4 hours to 3 days. Talk with your doctor about what things may bring on (trigger) your migraine headaches. What are the causes? The exact cause of this condition is not known. However, a migraine may be caused when nerves in the brain become irritated and release chemicals that cause inflammation of blood vessels. This inflammation causes pain. This condition may be triggered or caused by:  Drinking alcohol.  Smoking.  Taking medicines, such as: ? Medicine used to treat chest pain (nitroglycerin). ? Birth control pills. ? Estrogen. ? Certain blood pressure medicines.  Eating or drinking products that contain nitrates, glutamate, aspartame, or tyramine. Aged cheeses, chocolate, or caffeine may also be triggers.  Doing physical activity. Other things that may trigger a migraine headache include:  Menstruation.  Pregnancy.  Hunger.  Stress.  Lack of sleep or too much sleep.  Weather changes.  Fatigue. What increases the risk? The following factors may make you more likely to experience migraine headaches:  Being a certain age. This condition is more common in people who are 44-29 years old.  Being male.  Having a family history of migraine headaches.  Being Caucasian.  Having a mental health condition, such as depression or anxiety.  Being obese. What are the signs or symptoms? The main symptom of this condition is pulsating or throbbing pain. This pain may:  Happen in any area of the head, such as on one side or both sides.  Interfere with daily activities.  Get worse with physical activity.  Get worse with exposure to bright lights or loud noises. Other symptoms may include:  Nausea.  Vomiting.   Dizziness.  General sensitivity to bright lights, loud noises, or smells. Before you get a migraine headache, you may get warning signs (an aura). An aura may include:  Seeing flashing lights or having blind spots.  Seeing bright spots, halos, or zigzag lines.  Having tunnel vision or blurred vision.  Having numbness or a tingling feeling.  Having trouble talking.  Having muscle weakness. Some people have symptoms after a migraine headache (postdromal phase), such as:  Feeling tired.  Difficulty concentrating. How is this diagnosed? A migraine headache can be diagnosed based on:  Your symptoms.  A physical exam.  Tests, such as: ? CT scan or an MRI of the head. These imaging tests can help rule out other causes of headaches. ? Taking fluid from the spine (lumbar puncture) and analyzing it (cerebrospinal fluid analysis, or CSF analysis). How is this treated? This condition may be treated with medicines that:  Relieve pain.  Relieve nausea.  Prevent migraine headaches. Treatment for this condition may also include:  Acupuncture.  Lifestyle changes like avoiding foods that trigger migraine headaches.  Biofeedback.  Cognitive behavioral therapy. Follow these instructions at home: Medicines  Take over-the-counter and prescription medicines only as told by your health care provider.  Ask your health care provider if the medicine prescribed to you: ? Requires you to avoid driving or using heavy machinery. ? Can cause constipation. You may need to take these actions to prevent or treat constipation:  Drink enough fluid to keep your urine pale yellow.  Take over-the-counter or prescription medicines.  Eat foods that are high in fiber, such as beans, whole grains, and  fresh fruits and vegetables.  Limit foods that are high in fat and processed sugars, such as fried or sweet foods. Lifestyle  Do not drink alcohol.  Do not use any products that contain nicotine  or tobacco, such as cigarettes, e-cigarettes, and chewing tobacco. If you need help quitting, ask your health care provider.  Get at least 8 hours of sleep every night.  Find ways to manage stress, such as meditation, deep breathing, or yoga. General instructions      Keep a journal to find out what may trigger your migraine headaches. For example, write down: ? What you eat and drink. ? How much sleep you get. ? Any change to your diet or medicines.  If you have a migraine headache: ? Avoid things that make your symptoms worse, such as bright lights. ? It may help to lie down in a dark, quiet room. ? Do not drive or use heavy machinery. ? Ask your health care provider what activities are safe for you while you are experiencing symptoms.  Keep all follow-up visits as told by your health care provider. This is important. Contact a health care provider if:  You develop symptoms that are different or more severe than your usual migraine headache symptoms.  You have more than 15 headache days in one month. Get help right away if:  Your migraine headache becomes severe.  Your migraine headache lasts longer than 72 hours.  You have a fever.  You have a stiff neck.  You have vision loss.  Your muscles feel weak or like you cannot control them.  You start to lose your balance often.  You have trouble walking.  You faint.  You have a seizure. Summary  A migraine headache is an intense, throbbing pain on one side or both sides of the head. Migraines may also cause other symptoms, such as nausea, vomiting, and sensitivity to light and noise.  This condition may be treated with medicines and lifestyle changes. You may also need to avoid certain things that trigger a migraine headache.  Keep a journal to find out what may trigger your migraine headaches.  Contact your health care provider if you have more than 15 headache days in a month or you develop symptoms that are  different or more severe than your usual migraine headache symptoms. This information is not intended to replace advice given to you by your health care provider. Make sure you discuss any questions you have with your health care provider. Document Released: 06/11/2005 Document Revised: 10/03/2018 Document Reviewed: 07/24/2018 Elsevier Patient Education  [employer].  Insomnia Insomnia is a sleep disorder that makes it difficult to fall asleep or stay asleep. Insomnia can cause fatigue, low energy, difficulty concentrating, mood swings, and poor performance at work or school. There are three different ways to classify insomnia:  Difficulty falling asleep.  Difficulty staying asleep.  Waking up too early in the morning. Any type of insomnia can be long-term (chronic) or short-term (acute). Both are common. Short-term insomnia usually lasts for three months or less. Chronic insomnia occurs at least three times a week for longer than three months. What are the causes? Insomnia may be caused by another condition, situation, or substance, such as:  Anxiety.  Certain medicines.  Gastroesophageal reflux disease (GERD) or other gastrointestinal conditions.  Asthma or other breathing conditions.  Restless legs syndrome, sleep apnea, or other sleep disorders.  Chronic pain.  Menopause.  Stroke.  Abuse of alcohol, tobacco, or illegal  drugs.  Mental health conditions, such as depression.  Caffeine.  Neurological disorders, such as Alzheimer's disease.  An overactive thyroid (hyperthyroidism). Sometimes, the cause of insomnia may not be known. What increases the risk? Risk factors for insomnia include:  Gender. Women are affected more often than men.  Age. Insomnia is more common as you get older.  Stress.  Lack of exercise.  Irregular work schedule or working night shifts.  Traveling between different time zones.  Certain medical and mental health conditions.  What are the signs or symptoms? If you have insomnia, the main symptom is having trouble falling asleep or having trouble staying asleep. This may lead to other symptoms, such as:  Feeling fatigued or having low energy.  Feeling nervous about going to sleep.  Not feeling rested in the morning.  Having trouble concentrating.  Feeling irritable, anxious, or depressed. How is this diagnosed? This condition may be diagnosed based on:  Your symptoms and medical history. Your health care provider may ask about: ? Your sleep habits. ? Any medical conditions you have. ? Your mental health.  A physical exam. How is this treated? Treatment for insomnia depends on the cause. Treatment may focus on treating an underlying condition that is causing insomnia. Treatment may also include:  Medicines to help you sleep.  Counseling or therapy.  Lifestyle adjustments to help you sleep better. Follow these instructions at home: Eating and drinking   Limit or avoid alcohol, caffeinated beverages, and cigarettes, especially close to bedtime. These can disrupt your sleep.  Do not eat a large meal or eat spicy foods right before bedtime. This can lead to digestive discomfort that can make it hard for you to sleep. Sleep habits   Keep a sleep diary to help you and your health care provider figure out what could be causing your insomnia. Write down: ? When you sleep. ? When you wake up during the night. ? How well you sleep. ? How rested you feel the next day. ? Any side effects of medicines you are taking. ? What you eat and drink.  Make your bedroom a dark, comfortable place where it is easy to fall asleep. ? Put up shades or blackout curtains to block light from outside. ? Use a white noise machine to block noise. ? Keep the temperature cool.  Limit screen use before bedtime. This includes: ? Watching TV. ? Using your smartphone, tablet, or computer.  Stick to a routine that includes  going to bed and waking up at the same times every day and night. This can help you fall asleep faster. Consider making a quiet activity, such as reading, part of your nighttime routine.  Try to avoid taking naps during the day so that you sleep better at night.  Get out of bed if you are still awake after 15 minutes of trying to sleep. Keep the lights down, but try reading or doing a quiet activity. When you feel sleepy, go back to bed. General instructions  Take over-the-counter and prescription medicines only as told by your health care provider.  Exercise regularly, as told by your health care provider. Avoid exercise starting several hours before bedtime.  Use relaxation techniques to manage stress. Ask your health care provider to suggest some techniques that may work well for you. These may include: ? Breathing exercises. ? Routines to release muscle tension. ? Visualizing peaceful scenes.  Make sure that you drive carefully. Avoid driving if you feel very sleepy.  Keep all  follow-up visits as told by your health care provider. This is important. Contact a health care provider if:  You are tired throughout the day.  You have trouble in your daily routine due to sleepiness.  You continue to have sleep problems, or your sleep problems get worse. Get help right away if:  You have serious thoughts about hurting yourself or someone else. If you ever feel like you may hurt yourself or others, or have thoughts about taking your own life, get help right away. You can go to your nearest emergency department or call:  Your local emergency services (911 in the [place]  A suicide crisis helpline, such as the [facility] at 815-040-9974. This is open 24 hours a day. Summary  Insomnia is a sleep disorder that makes it difficult to fall asleep or stay asleep.  Insomnia can be long-term (chronic) or short-term (acute).  Treatment for insomnia depends on the  cause. Treatment may focus on treating an underlying condition that is causing insomnia.  Keep a sleep diary to help you and your health care provider figure out what could be causing your insomnia. This information is not intended to replace advice given to you by your health care provider. Make sure you discuss any questions you have with your health care provider. Document Released: 06/08/2000 Document Revised: 05/24/2017 Document Reviewed: 03/21/2017 Elsevier Patient Education  2020 [employer].  Migraine Headache A migraine headache is an intense, throbbing pain on one side or both sides of the head. Migraine headaches may also cause other symptoms, such as nausea, vomiting, and sensitivity to light and noise. A migraine headache can last from 4 hours to 3 days. Talk with your doctor about what things may bring on (trigger) your migraine headaches. What are the causes? The exact cause of this condition is not known. However, a migraine may be caused when nerves in the brain become irritated and release chemicals that cause inflammation of blood vessels. This inflammation causes pain. This condition may be triggered or caused by:  Drinking alcohol.  Smoking.  Taking medicines, such as: ? Medicine used to treat chest pain (nitroglycerin). ? Birth control pills. ? Estrogen. ? Certain blood pressure medicines.  Eating or drinking products that contain nitrates, glutamate, aspartame, or tyramine. Aged cheeses, chocolate, or caffeine may also be triggers.  Doing physical activity. Other things that may trigger a migraine headache include:  Menstruation.  Pregnancy.  Hunger.  Stress.  Lack of sleep or too much sleep.  Weather changes.  Fatigue. What increases the risk? The following factors may make you more likely to experience migraine headaches:  Being a certain age. This condition is more common in people who are 5-89 years old.  Being male.  Having a family  history of migraine headaches.  Being Caucasian.  Having a mental health condition, such as depression or anxiety.  Being obese. What are the signs or symptoms? The main symptom of this condition is pulsating or throbbing pain. This pain may:  Happen in any area of the head, such as on one side or both sides.  Interfere with daily activities.  Get worse with physical activity.  Get worse with exposure to bright lights or loud noises. Other symptoms may include:  Nausea.  Vomiting.  Dizziness.  General sensitivity to bright lights, loud noises, or smells. Before you get a migraine headache, you may get warning signs (an aura). An aura may include:  Seeing flashing lights or having blind spots.  Seeing bright spots, halos, or zigzag lines.  Having tunnel vision or blurred vision.  Having numbness or a tingling feeling.  Having trouble talking.  Having muscle weakness. Some people have symptoms after a migraine headache (postdromal phase), such as:  Feeling tired.  Difficulty concentrating. How is this diagnosed? A migraine headache can be diagnosed based on:  Your symptoms.  A physical exam.  Tests, such as: ? CT scan or an MRI of the head. These imaging tests can help rule out other causes of headaches. ? Taking fluid from the spine (lumbar puncture) and analyzing it (cerebrospinal fluid analysis, or CSF analysis). How is this treated? This condition may be treated with medicines that:  Relieve pain.  Relieve nausea.  Prevent migraine headaches. Treatment for this condition may also include:  Acupuncture.  Lifestyle changes like avoiding foods that trigger migraine headaches.  Biofeedback.  Cognitive behavioral therapy. Follow these instructions at home: Medicines  Take over-the-counter and prescription medicines only as told by your health care provider.  Ask your health care provider if the medicine prescribed to you: ? Requires you to  avoid driving or using heavy machinery. ? Can cause constipation. You may need to take these actions to prevent or treat constipation:  Drink enough fluid to keep your urine pale yellow.  Take over-the-counter or prescription medicines.  Eat foods that are high in fiber, such as beans, whole grains, and fresh fruits and vegetables.  Limit foods that are high in fat and processed sugars, such as fried or sweet foods. Lifestyle  Do not drink alcohol.  Do not use any products that contain nicotine or tobacco, such as cigarettes, e-cigarettes, and chewing tobacco. If you need help quitting, ask your health care provider.  Get at least 8 hours of sleep every night.  Find ways to manage stress, such as meditation, deep breathing, or yoga. General instructions      Keep a journal to find out what may trigger your migraine headaches. For example, write down: ? What you eat and drink. ? How much sleep you get. ? Any change to your diet or medicines.  If you have a migraine headache: ? Avoid things that make your symptoms worse, such as bright lights. ? It may help to lie down in a dark, quiet room. ? Do not drive or use heavy machinery. ? Ask your health care provider what activities are safe for you while you are experiencing symptoms.  Keep all follow-up visits as told by your health care provider. This is important. Contact a health care provider if:  You develop symptoms that are different or more severe than your usual migraine headache symptoms.  You have more than 15 headache days in one month. Get help right away if:  Your migraine headache becomes severe.  Your migraine headache lasts longer than 72 hours.  You have a fever.  You have a stiff neck.  You have vision loss.  Your muscles feel weak or like you cannot control them.  You start to lose your balance often.  You have trouble walking.  You faint.  You have a seizure. Summary  A migraine headache  is an intense, throbbing pain on one side or both sides of the head. Migraines may also cause other symptoms, such as nausea, vomiting, and sensitivity to light and noise.  This condition may be treated with medicines and lifestyle changes. You may also need to avoid certain things that trigger a migraine headache.  Keep a journal  to find out what may trigger your migraine headaches.  Contact your health care provider if you have more than 15 headache days in a month or you develop symptoms that are different or more severe than your usual migraine headache symptoms. This information is not intended to replace advice given to you by your health care provider. Make sure you discuss any questions you have with your health care provider. Document Released: 06/11/2005 Document Revised: 10/03/2018 Document Reviewed: 07/24/2018 Elsevier Patient Education  2020 [employer].
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
## 2019-04-03 DIAGNOSIS — Z23 Encounter for immunization: Secondary | ICD-10-CM | POA: Diagnosis not present

## 2019-07-25 ENCOUNTER — Ambulatory Visit: Payer: Medicare Other

## 2019-08-01 ENCOUNTER — Ambulatory Visit: Payer: Medicare Other | Attending: Internal Medicine

## 2019-08-01 DIAGNOSIS — Z23 Encounter for immunization: Secondary | ICD-10-CM | POA: Insufficient documentation

## 2019-08-01 NOTE — Progress Notes (Signed)
   Covid-19 Vaccination Clinic  Name:  Joseph Gill    MRN: [id number] DOB: [birth year]  08/01/2019  Joseph Gill was observed post Covid-19 immunization for 15 minutes without incidence. He was provided with Vaccine Information Sheet and instruction to access the V-Safe system.   Joseph Gill was instructed to call 911 with any severe reactions post vaccine: Marland Kitchen Difficulty breathing  . Swelling of your face and throat  . A fast heartbeat  . A bad rash all over your body  . Dizziness and weakness    Immunizations Administered    Name Date Dose VIS Date Route   Pfizer COVID-19 Vaccine 08/01/2019  5:52 PM 0.3 mL 06/05/2019 Intramuscular   Manufacturer: [place]   Lot: [id number]   [place]: [id number]

## 2019-08-05 ENCOUNTER — Ambulatory Visit: Payer: Medicare Other

## 2019-08-26 ENCOUNTER — Ambulatory Visit: Payer: Medicare Other | Attending: Internal Medicine

## 2019-08-26 DIAGNOSIS — Z23 Encounter for immunization: Secondary | ICD-10-CM | POA: Insufficient documentation

## 2019-08-26 NOTE — Progress Notes (Signed)
   Covid-19 Vaccination Clinic  Name:  Joseph Gill    MRN: [id number] DOB: [birth year]  08/26/2019  [name] was observed post Covid-19 immunization for 15 minutes without incident. He was provided with Vaccine Information Sheet and instruction to access the V-Safe system.   [name] was instructed to call 911 with any severe reactions post vaccine: Marland Kitchen Difficulty breathing  . Swelling of face and throat  . A fast heartbeat  . A bad rash all over body  . Dizziness and weakness   Immunizations Administered    Name Date Dose VIS Date Route   Pfizer COVID-19 Vaccine 08/26/2019  3:08 PM 0.3 mL 06/05/2019 Intramuscular   Manufacturer: [place]   Lot: [id number]   [place]: [id number]

## 2019-10-08 ENCOUNTER — Other Ambulatory Visit: Payer: Self-pay | Admitting: Family Medicine

## 2019-10-08 DIAGNOSIS — E349 Endocrine disorder, unspecified: Secondary | ICD-10-CM

## 2019-10-09 ENCOUNTER — Other Ambulatory Visit: Payer: Self-pay

## 2019-10-09 NOTE — Telephone Encounter (Signed)
Pt LOV was on 04/02/2019 and pt last refill was done on 04/02/2019,please advise if ok to refill

## 2019-10-14 ENCOUNTER — Other Ambulatory Visit: Payer: Self-pay | Admitting: Family Medicine

## 2019-10-14 DIAGNOSIS — E349 Endocrine disorder, unspecified: Secondary | ICD-10-CM

## 2019-10-14 NOTE — Telephone Encounter (Signed)
Pt LOV was on 04/01/2020 and last refill was done on 04/02/2019 for 75 g, please advise if ok to send refill

## 2019-10-16 MED ORDER — TESTOSTERONE 20.25 MG/ACT (1.62%) TD GEL: TRANSDERMAL | 0 refills | Status: DC

## 2019-10-26 ENCOUNTER — Other Ambulatory Visit: Payer: Self-pay | Admitting: Family Medicine

## 2019-10-26 DIAGNOSIS — M25562 Pain in left knee: Secondary | ICD-10-CM

## 2019-10-26 DIAGNOSIS — M25561 Pain in right knee: Secondary | ICD-10-CM

## 2019-10-26 DIAGNOSIS — G8929 Other chronic pain: Secondary | ICD-10-CM

## 2019-10-26 NOTE — Telephone Encounter (Signed)
Pt LOV was on 04/02/2019 and last refill was 04/02/2019 for 60 tablets with 1 refill, please advise

## 2019-11-20 ENCOUNTER — Other Ambulatory Visit: Payer: Self-pay | Admitting: Family Medicine

## 2019-11-20 DIAGNOSIS — E349 Endocrine disorder, unspecified: Secondary | ICD-10-CM

## 2019-11-20 NOTE — Telephone Encounter (Signed)
Pt needs appointment for further refills 

## 2019-11-25 ENCOUNTER — Other Ambulatory Visit: Payer: Self-pay | Admitting: Family Medicine

## 2019-11-25 DIAGNOSIS — Z8669 Personal history of other diseases of the nervous system and sense organs: Secondary | ICD-10-CM

## 2019-11-30 NOTE — Telephone Encounter (Signed)
Pt LOV was 04/02/2019 and last refill was on 10/16/2019, ok for refill

## 2020-01-04 ENCOUNTER — Other Ambulatory Visit: Payer: Self-pay | Admitting: Family Medicine

## 2020-01-04 DIAGNOSIS — G8929 Other chronic pain: Secondary | ICD-10-CM

## 2020-01-08 ENCOUNTER — Other Ambulatory Visit: Payer: Self-pay | Admitting: Family Medicine

## 2020-01-08 ENCOUNTER — Telehealth: Payer: Self-pay

## 2020-01-08 DIAGNOSIS — E349 Endocrine disorder, unspecified: Secondary | ICD-10-CM

## 2020-01-08 NOTE — Telephone Encounter (Signed)
1.Medication Requested:traMADol (ULTRAM) 50 MG tablet  2. Pharmacy (Name, Street, [place]): [facility] [place], [place] - [place]  3. On Med List: Yes   4. Last Visit with PCP: 10.8.20   5. Next visit date with PCP: n/a   Agent: Please be advised that RX refills may take up to 3 business days. We ask that you follow-up with your pharmacy.

## 2020-01-11 NOTE — Telephone Encounter (Signed)
Pt calling back to follow up on a refill for Tramadol 50 mg tablet. Pt uses [facility] in [place].

## 2020-01-12 NOTE — Telephone Encounter (Signed)
Pt LOV was on 04/02/2019 and last refill was done on 12/07/2019 for 75 g ok to send refill

## 2020-01-15 NOTE — Telephone Encounter (Signed)
Refill was done

## 2020-02-15 ENCOUNTER — Other Ambulatory Visit: Payer: Self-pay | Admitting: Family Medicine

## 2020-02-15 DIAGNOSIS — M25562 Pain in left knee: Secondary | ICD-10-CM

## 2020-02-15 DIAGNOSIS — G8929 Other chronic pain: Secondary | ICD-10-CM

## 2020-02-15 DIAGNOSIS — Z8669 Personal history of other diseases of the nervous system and sense organs: Secondary | ICD-10-CM

## 2020-02-16 NOTE — Telephone Encounter (Signed)
Left detailed message for pt to call the office and schedule a CPE / med refills appointment

## 2020-02-16 NOTE — Telephone Encounter (Signed)
Patient is scheduled for 09/01

## 2020-02-17 NOTE — Telephone Encounter (Signed)
Okay to refill since patient is scheduled to see you on 02/24/2020

## 2020-02-23 ENCOUNTER — Other Ambulatory Visit: Payer: Self-pay

## 2020-02-24 ENCOUNTER — Encounter: Payer: Self-pay | Admitting: Family Medicine

## 2020-02-24 ENCOUNTER — Ambulatory Visit (INDEPENDENT_AMBULATORY_CARE_PROVIDER_SITE_OTHER): Payer: Medicare Other | Admitting: Family Medicine

## 2020-02-24 VITALS — BP 118/76 | HR 76 | Temp 98.6°F | Ht 70.0 in | Wt 176.0 lb

## 2020-02-24 DIAGNOSIS — E349 Endocrine disorder, unspecified: Secondary | ICD-10-CM | POA: Diagnosis not present

## 2020-02-24 DIAGNOSIS — M1990 Unspecified osteoarthritis, unspecified site: Secondary | ICD-10-CM

## 2020-02-24 DIAGNOSIS — K5909 Other constipation: Secondary | ICD-10-CM

## 2020-02-24 DIAGNOSIS — E785 Hyperlipidemia, unspecified: Secondary | ICD-10-CM

## 2020-02-24 DIAGNOSIS — H6123 Impacted cerumen, bilateral: Secondary | ICD-10-CM | POA: Diagnosis not present

## 2020-02-24 DIAGNOSIS — Z0000 Encounter for general adult medical examination without abnormal findings: Secondary | ICD-10-CM

## 2020-02-24 DIAGNOSIS — R0981 Nasal congestion: Secondary | ICD-10-CM | POA: Diagnosis not present

## 2020-02-24 DIAGNOSIS — Z0001 Encounter for general adult medical examination with abnormal findings: Secondary | ICD-10-CM

## 2020-02-24 DIAGNOSIS — G43809 Other migraine, not intractable, without status migrainosus: Secondary | ICD-10-CM | POA: Diagnosis not present

## 2020-02-24 MED ORDER — SUMATRIPTAN SUCCINATE 100 MG PO TABS: ORAL_TABLET | ORAL | 11 refills | Status: DC

## 2020-02-24 MED ORDER — AMITRIPTYLINE HCL 25 MG PO TABS: 25.0000 mg | ORAL_TABLET | Freq: Every day | ORAL | 3 refills | Status: DC

## 2020-02-24 MED ORDER — ATORVASTATIN CALCIUM 40 MG PO TABS: 40.0000 mg | ORAL_TABLET | Freq: Every day | ORAL | 3 refills | Status: DC

## 2020-02-24 MED ORDER — TRAMADOL HCL 50 MG PO TABS: ORAL_TABLET | ORAL | 2 refills | Status: DC

## 2020-02-24 NOTE — Progress Notes (Signed)
Annual Wellness Visit  Patient: Joseph Gill, Male    DOB: 12/18/1950, 69 y.o.   MRN: [id number] Visit Date: 02/24/2020  Today's Provider: Billie Ruddy, MD  Subjective:   No chief complaint on file.  [name] is a 69 y.o. male who presents today for his Annual Wellness Visit.  HPI Patient states he is doing well overall.  Endorses migraines. Had one this am making it the 4th day in a row.  Requesting refill on amitriptyline and Imitrex.  Pt also needs refill on lipitor and tramadol for arthritis.  Pt with nasal congestion in the am.  Walking for exercise.  Pt denies falls, trouble with memory.  Seen by [facility].  Pt has a h/o testosterone def.  Using topical testosterone.  Endorses constipation.  Patient completed COVID-19 vaccines.  Will contact clinic with dates.  Patient Active Problem List   Diagnosis Date Noted  . Reaction to Pneumovax immunization 04/28/2018  . Influenza A 04/28/2018  . Hx of colonic polyps 10/23/2017  . Testosterone deficiency 01/26/2014  . Dyslipidemia 04/29/2012  . 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 MIGRAINE HEADACHE 11/03/2008  . Allergic rhinitis 03/26/2007   Family History  Problem Relation Age of Onset  . Heart disease Mother   . Heart disease Father   . Lung cancer Maternal Uncle   . Heart disease Maternal Grandfather   . Prostate cancer Paternal Grandfather    Allergies  Allergen Reactions  . Erythromycin     Feels terrible!       Medications: Outpatient Medications Prior to Visit  Medication Sig  . acetaminophen (TYLENOL) 500 MG tablet Take 500 mg by mouth as needed.  Marland Kitchen amitriptyline (ELAVIL) 25 MG tablet Take 1 tablet (25 mg total) by mouth at bedtime.  Marland Kitchen atorvastatin (LIPITOR) 40 MG tablet Take 1 tablet (40 mg total) by mouth daily.  . diphenhydrAMINE (BENADRYL) 25 mg capsule Take 25 mg by mouth as needed.  Marland Kitchen FLUARIX QUADRIVALENT 0.5 ML injection 0.5 Doses.  Marland Kitchen Lifitegrast (XIIDRA) 5 %  SOLN Apply to eye. Use one drop each eye twice a day  . loratadine (CLARITIN) 10 MG tablet Take 10 mg by mouth as needed.   . mometasone (NASONEX) 50 MCG/ACT nasal spray PLACE 2 SPRAYS INTO THE NOSE  DAILY.  . Multiple Vitamin (MULTIVITAMIN) tablet Take 1 tablet by mouth daily.  . phenylephrine (NEO-SYNEPHRINE) 0.25 % nasal spray Place 1 spray into both nostrils as needed for congestion.  . pseudoephedrine (SUDAFED) 30 MG tablet Take 30 mg by mouth as needed for congestion.  . SUMAtriptan (IMITREX) 100 MG tablet TAKE 1 TABLET BY MOUTH AT ONSET OF MIGRAINE OR HEADACHE. MAY REPEAT IN 2 HOURS IF HEADACHE PERSISTS.  Marland Kitchen Testosterone 20.25 MG/ACT (1.62%) GEL USE 2 PUMPS ON THE SKIN EVERY MORNING AS DIRECTED  . traMADol (ULTRAM) 50 MG tablet TAKE 1 TABLET BY MOUTH EVERY 12 (TWELVE) HOURS AS NEEDED.   No facility-administered medications prior to visit.    Allergies  Allergen Reactions  . Erythromycin     Feels terrible!    Patient Care Team: Billie Ruddy, MD as PCP - General (Family Medicine)  [facility].  Review of Systems  A 12 point review of systems negative unless otherwise mentioned above in HPI.  Last CBC Lab Results  Component Value Date   WBC 6.3 04/02/2019   HGB 14.7 04/02/2019   HCT 44.6 04/02/2019   MCV 96.4 04/02/2019  RDW 14.3 04/02/2019   PLT 291.0 60/63/0160   Last metabolic panel Lab Results  Component Value Date   GLUCOSE 94 07/02/2016   NA 138 07/02/2016   K 4.5 07/02/2016   CL 101 07/02/2016   CO2 27 07/02/2016   BUN 19 07/02/2016   CREATININE 0.97 07/02/2016   GFRNONAA 92.23 07/07/2009   CALCIUM 9.7 07/02/2016   PROT 7.4 07/02/2016   ALBUMIN 4.7 07/02/2016   BILITOT 0.5 07/02/2016   ALKPHOS 67 07/02/2016   AST 21 07/02/2016   ALT 27 07/02/2016   Last lipids Lab Results  Component Value Date   CHOL 164 04/02/2019   HDL 43.20 04/02/2019   LDLDIRECT 82.0 04/02/2019   TRIG 323.0 (H) 04/02/2019   CHOLHDL 4 04/02/2019         Objective:    Vitals: There were no vitals taken for this visit. BP Readings from Last 3 Encounters:  04/02/19 118/82  04/28/18 128/76  04/10/18 102/70   Wt Readings from Last 3 Encounters:  04/02/19 175 lb (79.4 kg)  04/28/18 174 lb (78.9 kg)  04/10/18 178 lb (80.7 kg)      Physical Exam Gen. Pleasant, well developed, well-nourished, in NAD HEENT - [place]/AT, PERRL, EOMI, conjunctive clear, no scleral icterus, no nasal drainage, left nares narrow with mild erythema and edema.  Pharynx without erythema or exudate.  Bilateral canals impacted with cerumen.  TMs normal b/l after irrigation. Neck: No JVD, no thyromegaly, no carotid bruits Lungs: no use of accessory muscles, CTAB, no wheezes, rales or rhonchi Cardiovascular: RRR, No r/g/m, no peripheral edema Abdomen: BS present, soft, nontender,nondistended, no hepatosplenomegaly Musculoskeletal: No deformities, moves all four extremities, no cyanosis or clubbing, normal tone Neuro:  A&Ox3, CN II-XII intact, normal gait Skin:  Warm, dry, intact, no lesions Psych: normal affect, mood appropriate  Most recent fall risk assessment: Fall Risk  02/24/2020  Falls in the past year? 0     Most recent depression screenings: PHQ 2/9 Scores 02/24/2020 01/26/2014  PHQ - 2 Score 0 0    Most recent cognitive screening: A&O x3    Assessment & Plan:      Annual wellness visit done today including the all of the following: Reviewed patient's Family Medical History Reviewed and updated list of patient's medical providers Assessment of cognitive impairment was done Assessed patient's functional ability Established a written schedule for health screening [place] Completed and Reviewed  Exercise Activities and Dietary recommendations Goals   None     Immunization History  Administered Date(s) Administered  . Influenza Split 04/27/2011, 03/18/2012  . Influenza Whole 04/09/2007, 03/27/2008, 03/29/2009, 03/07/2010   . Influenza, High Dose Seasonal PF 03/27/2017, 03/31/2018, 04/03/2019  . Influenza, Seasonal, Injecte, Preservative Fre 03/10/2015  . Influenza-Unspecified 03/31/2014, 03/15/2016, 03/27/2017  . PFIZER SARS-COV-2 Vaccination 08/01/2019, 08/26/2019  . Pneumococcal Conjugate-13 04/24/2017  . Pneumococcal Polysaccharide-23 04/24/2018  . Td 06/25/1996  . Tdap 01/26/2014  . Zoster 03/25/2012    Health Maintenance  Topic Date Due  . INFLUENZA VACCINE  01/24/2020  . COLONOSCOPY  07/17/2021  . TETANUS/TDAP  01/27/2024  . COVID-19 Vaccine  Completed  . Hepatitis C Screening  Completed  . PNA vac Low Risk Adult  Completed     Discussed health benefits of physical activity, and encouraged him to engage in regular exercise appropriate for his age and condition.   Other migraine without status migrainosus, not intractable  -discussed migraine prevention including hydration, rest, decreasing stress, limiting caffeine intake -for continued or  worsening symptoms consider follow-up with neurology and imaging. - Plan: CMP with eGFR([facility]), TSH, T4, free, amitriptyline (ELAVIL) 25 MG tablet, SUMAtriptan (IMITREX) 100 MG tablet  Medicare annual wellness visit, subsequent -preventative care measures reviewed -immunizations up-to-date  Bilateral impacted cerumen -consent obtained.  Cerumen removed bilaterally.  Patient tolerated procedure well.  Nasal congestion -continue current allergy medications including Claritin -consider using saline nasal rinse -Patient advised to consider follow-up with ENT given narrowing in left nares.  Arthritis -given handout  - Plan: CBC with Differential/Platelet, traMADol (ULTRAM) 50 MG tablet  Chronic constipation  -consider miralax prn. -given handout - Plan: CMP with eGFR([facility]), TSH, T4, free  Testosterone deficiency  -continue testosterone gel q am - Plan: CBC with Differential/Platelet, Testosterone Total,Free,Bio, Males  Dyslipidemia   -continue Lipitor 40 mg -continue lifestyle modifications - Plan: Lipid panel, atorvastatin (LIPITOR) 40 MG tablet  F/u prn in the next month  Billie Ruddy, MD

## 2020-02-24 NOTE — Patient Instructions (Signed)
Preventive Care 22 Years and Older, Male Preventive care refers to lifestyle choices and visits with your health care provider that can promote health and wellness. This includes:  A yearly physical exam. This is also called an annual well check.  Regular dental and eye exams.  Immunizations.  Screening for certain conditions.  Healthy lifestyle choices, such as diet and exercise. What can I expect for my preventive care visit? Physical exam Your health care provider will check:  Height and weight. These may be used to calculate body mass index (BMI), which is a measurement that tells if you are at a healthy weight.  Heart rate and blood pressure.  Your skin for abnormal spots. Counseling Your health care provider may ask you questions about:  Alcohol, tobacco, and drug use.  Emotional well-being.  Home and relationship well-being.  Sexual activity.  Eating habits.  History of falls.  Memory and ability to understand (cognition).  Work and work Statistician. What immunizations do I need?  Influenza (flu) vaccine  This is recommended every year. Tetanus, diphtheria, and pertussis (Tdap) vaccine  You may need a Td booster every 10 years. Varicella (chickenpox) vaccine  You may need this vaccine if you have not already been vaccinated. Zoster (shingles) vaccine  You may need this after age 35. Pneumococcal conjugate (PCV13) vaccine  One dose is recommended after age 34. Pneumococcal polysaccharide (PPSV23) vaccine  One dose is recommended after age 55. Measles, mumps, and rubella (MMR) vaccine  You may need at least one dose of MMR if you were born in 1957 or later. You may also need a second dose. Meningococcal conjugate (MenACWY) vaccine  You may need this if you have certain conditions. Hepatitis A vaccine  You may need this if you have certain conditions or if you travel or work in places where you may be exposed to hepatitis A. Hepatitis B  vaccine  You may need this if you have certain conditions or if you travel or work in places where you may be exposed to hepatitis B. Haemophilus influenzae type b (Hib) vaccine  You may need this if you have certain conditions. You may receive vaccines as individual doses or as more than one vaccine together in one shot (combination vaccines). Talk with your health care provider about the risks and benefits of combination vaccines. What tests do I need? Blood tests  Lipid and cholesterol levels. These may be checked every 5 years, or more frequently depending on your overall health.  Hepatitis C test.  Hepatitis B test. Screening  Lung cancer screening. You may have this screening every year starting at age 6 if you have a 30-pack-year history of smoking and currently smoke or have quit within the past 15 years.  Colorectal cancer screening. All adults should have this screening starting at age 94 and continuing until age 44. Your health care provider may recommend screening at age 32 if you are at increased risk. You will have tests every 1-10 years, depending on your results and the type of screening test.  Prostate cancer screening. Recommendations will vary depending on your family history and other risks.  Diabetes screening. This is done by checking your blood sugar (glucose) after you have not eaten for a while (fasting). You may have this done every 1-3 years.  Abdominal aortic aneurysm (AAA) screening. You may need this if you are a current or former smoker.  Sexually transmitted disease (STD) testing. Follow these instructions at home: Eating and drinking  Eat  a diet that includes fresh fruits and vegetables, whole grains, lean protein, and low-fat dairy products. Limit your intake of foods with high amounts of sugar, saturated fats, and salt.  Take vitamin and mineral supplements as recommended by your health care provider.  Do not drink alcohol if your health care  provider tells you not to drink.  If you drink alcohol: ? Limit how much you have to 0-2 drinks a day. ? Be aware of how much alcohol is in your drink. In the [place] one drink equals one 12 oz bottle of beer (355 mL), one 5 oz glass of wine (148 mL), or one 1 oz glass of hard liquor (44 mL). Lifestyle  Take daily care of your teeth and gums.  Stay active. Exercise for at least 30 minutes on 5 or more days each week.  Do not use any products that contain nicotine or tobacco, such as cigarettes, e-cigarettes, and chewing tobacco. If you need help quitting, ask your health care provider.  If you are sexually active, practice safe sex. Use a condom or other form of protection to prevent STIs (sexually transmitted infections).  Talk with your health care provider about taking a low-dose aspirin or statin. What's next?  Visit your health care provider once a year for a well check visit.  Ask your health care provider how often you should have your eyes and teeth checked.  Stay up to date on all vaccines. This information is not intended to replace advice given to you by your health care provider. Make sure you discuss any questions you have with your health care provider. Document Revised: 06/05/2018 Document Reviewed: 06/05/2018 Elsevier Patient Education  [place].  Migraine Headache A migraine headache is an intense, throbbing pain on one side or both sides of the head. Migraine headaches may also cause other symptoms, such as nausea, vomiting, and sensitivity to light and noise. A migraine headache can last from 4 hours to 3 days. Talk with your doctor about what things may bring on (trigger) your migraine headaches. What are the causes? The exact cause of this condition is not known. However, a migraine may be caused when nerves in the brain become irritated and release chemicals that cause inflammation of blood vessels. This inflammation causes pain. This condition may be  triggered or caused by:  Drinking alcohol.  Smoking.  Taking medicines, such as: ? Medicine used to treat chest pain (nitroglycerin). ? Birth control pills. ? Estrogen. ? Certain blood pressure medicines.  Eating or drinking products that contain nitrates, glutamate, aspartame, or tyramine. Aged cheeses, chocolate, or caffeine may also be triggers.  Doing physical activity. Other things that may trigger a migraine headache include:  Menstruation.  Pregnancy.  Hunger.  Stress.  Lack of sleep or too much sleep.  Weather changes.  Fatigue. What increases the risk? The following factors may make you more likely to experience migraine headaches:  Being a certain age. This condition is more common in people who are 56-24 years old.  Being male.  Having a family history of migraine headaches.  Being Caucasian.  Having a mental health condition, such as depression or anxiety.  Being obese. What are the signs or symptoms? The main symptom of this condition is pulsating or throbbing pain. This pain may:  Happen in any area of the head, such as on one side or both sides.  Interfere with daily activities.  Get worse with physical activity.  Get worse with exposure to bright  lights or loud noises. Other symptoms may include:  Nausea.  Vomiting.  Dizziness.  General sensitivity to bright lights, loud noises, or smells. Before you get a migraine headache, you may get warning signs (an aura). An aura may include:  Seeing flashing lights or having blind spots.  Seeing bright spots, halos, or zigzag lines.  Having tunnel vision or blurred vision.  Having numbness or a tingling feeling.  Having trouble talking.  Having muscle weakness. Some people have symptoms after a migraine headache (postdromal phase), such as:  Feeling tired.  Difficulty concentrating. How is this diagnosed? A migraine headache can be diagnosed based on:  Your symptoms.  A  physical exam.  Tests, such as: ? CT scan or an MRI of the head. These imaging tests can help rule out other causes of headaches. ? Taking fluid from the spine (lumbar puncture) and analyzing it (cerebrospinal fluid analysis, or CSF analysis). How is this treated? This condition may be treated with medicines that:  Relieve pain.  Relieve nausea.  Prevent migraine headaches. Treatment for this condition may also include:  Acupuncture.  Lifestyle changes like avoiding foods that trigger migraine headaches.  Biofeedback.  Cognitive behavioral therapy. Follow these instructions at home: Medicines  Take over-the-counter and prescription medicines only as told by your health care provider.  Ask your health care provider if the medicine prescribed to you: ? Requires you to avoid driving or using heavy machinery. ? Can cause constipation. You may need to take these actions to prevent or treat constipation:  Drink enough fluid to keep your urine pale yellow.  Take over-the-counter or prescription medicines.  Eat foods that are high in fiber, such as beans, whole grains, and fresh fruits and vegetables.  Limit foods that are high in fat and processed sugars, such as fried or sweet foods. Lifestyle  Do not drink alcohol.  Do not use any products that contain nicotine or tobacco, such as cigarettes, e-cigarettes, and chewing tobacco. If you need help quitting, ask your health care provider.  Get at least 8 hours of sleep every night.  Find ways to manage stress, such as meditation, deep breathing, or yoga. General instructions      Keep a journal to find out what may trigger your migraine headaches. For example, write down: ? What you eat and drink. ? How much sleep you get. ? Any change to your diet or medicines.  If you have a migraine headache: ? Avoid things that make your symptoms worse, such as bright lights. ? It may help to lie down in a dark, quiet room. ? Do  not drive or use heavy machinery. ? Ask your health care provider what activities are safe for you while you are experiencing symptoms.  Keep all follow-up visits as told by your health care provider. This is important. Contact a health care provider if:  You develop symptoms that are different or more severe than your usual migraine headache symptoms.  You have more than 15 headache days in one month. Get help right away if:  Your migraine headache becomes severe.  Your migraine headache lasts longer than 72 hours.  You have a fever.  You have a stiff neck.  You have vision loss.  Your muscles feel weak or like you cannot control them.  You start to lose your balance often.  You have trouble walking.  You faint.  You have a seizure. Summary  A migraine headache is an intense, throbbing pain on one side  or both sides of the head. Migraines may also cause other symptoms, such as nausea, vomiting, and sensitivity to light and noise.  This condition may be treated with medicines and lifestyle changes. You may also need to avoid certain things that trigger a migraine headache.  Keep a journal to find out what may trigger your migraine headaches.  Contact your health care provider if you have more than 15 headache days in a month or you develop symptoms that are different or more severe than your usual migraine headache symptoms. This information is not intended to replace advice given to you by your health care provider. Make sure you discuss any questions you have with your health care provider. Document Revised: 10/03/2018 Document Reviewed: 07/24/2018 Elsevier Patient Education  [place], Adult The ears produce a substance called earwax that helps keep bacteria out of the ear and protects the skin in the ear canal. Occasionally, earwax can build up in the ear and cause discomfort or hearing loss. What increases the risk? This condition is more  likely to develop in people who:  Are male.  Are elderly.  Naturally produce more earwax.  Clean their ears often with cotton swabs.  Use earplugs often.  Use in-ear headphones often.  Wear hearing aids.  Have narrow ear canals.  Have earwax that is overly thick or sticky.  Have eczema.  Are dehydrated.  Have excess hair in the ear canal. What are the signs or symptoms? Symptoms of this condition include:  Reduced or muffled hearing.  A feeling of fullness in the ear or feeling that the ear is plugged.  Fluid coming from the ear.  Ear pain.  Ear itch.  Ringing in the ear.  Coughing.  An obvious piece of earwax that can be seen inside the ear canal. How is this diagnosed? This condition may be diagnosed based on:  Your symptoms.  Your medical history.  An ear exam. During the exam, your health care provider will look into your ear with an instrument called an otoscope. You may have tests, including a hearing test. How is this treated? This condition may be treated by:  Using ear drops to soften the earwax.  Having the earwax removed by a health care provider. The health care provider may: ? Flush the ear with water. ? Use an instrument that has a loop on the end (curette). ? Use a suction device.  Surgery to remove the wax buildup. This may be done in severe cases. Follow these instructions at home:   Take over-the-counter and prescription medicines only as told by your health care provider.  Do not put any objects, including cotton swabs, into your ear. You can clean the opening of your ear canal with a washcloth or facial tissue.  Follow instructions from your health care provider about cleaning your ears. Do not over-clean your ears.  Drink enough fluid to keep your urine clear or pale yellow. This will help to thin the earwax.  Keep all follow-up visits as told by your health care provider. If earwax builds up in your ears often or if you  use hearing aids, consider seeing your health care provider for routine, preventive ear cleanings. Ask your health care provider how often you should schedule your cleanings.  If you have hearing aids, clean them according to instructions from the manufacturer and your health care provider. Contact a health care provider if:  You have ear pain.  You develop a fever.  You have blood, pus, or other fluid coming from your ear.  You have hearing loss.  You have ringing in your ears that does not go away.  Your symptoms do not improve with treatment.  You feel like the room is spinning (vertigo). Summary  Earwax can build up in the ear and cause discomfort or hearing loss.  The most common symptoms of this condition include reduced or muffled hearing and a feeling of fullness in the ear or feeling that the ear is plugged.  This condition may be diagnosed based on your symptoms, your medical history, and an ear exam.  This condition may be treated by using ear drops to soften the earwax or by having the earwax removed by a health care provider.  Do not put any objects, including cotton swabs, into your ear. You can clean the opening of your ear canal with a washcloth or facial tissue. This information is not intended to replace advice given to you by your health care provider. Make sure you discuss any questions you have with your health care provider. Document Revised: 05/24/2017 Document Reviewed: 08/22/2016 Elsevier Patient Education  [place].  High Cholesterol  High cholesterol is a condition in which the blood has high levels of a white, waxy, fat-like substance (cholesterol). The human body needs small amounts of cholesterol. The liver makes all the cholesterol that the body needs. Extra (excess) cholesterol comes from the food that we eat. Cholesterol is carried from the liver by the blood through the blood vessels. If you have high cholesterol, deposits (plaques) may  build up on the walls of your blood vessels (arteries). Plaques make the arteries narrower and stiffer. Cholesterol plaques increase your risk for heart attack and stroke. Work with your health care provider to keep your cholesterol levels in a healthy range. What increases the risk? This condition is more likely to develop in people who:  Eat foods that are high in animal fat (saturated fat) or cholesterol.  Are overweight.  Are not getting enough exercise.  Have a family history of high cholesterol. What are the signs or symptoms? There are no symptoms of this condition. How is this diagnosed? This condition may be diagnosed from the results of a blood test.  If you are older than age 55, your health care provider may check your cholesterol every 4-6 years.  You may be checked more often if you already have high cholesterol or other risk factors for heart disease. The blood test for cholesterol measures:  "Bad" cholesterol (LDL cholesterol). This is the main type of cholesterol that causes heart disease. The desired level for LDL is less than 100.  "Good" cholesterol (HDL cholesterol). This type helps to protect against heart disease by cleaning the arteries and carrying the LDL away. The desired level for HDL is 60 or higher.  Triglycerides. These are fats that the body can store or burn for energy. The desired number for triglycerides is lower than 150.  Total cholesterol. This is a measure of the total amount of cholesterol in your blood, including LDL cholesterol, HDL cholesterol, and triglycerides. A healthy number is less than 200. How is this treated? This condition is treated with diet changes, lifestyle changes, and medicines. Diet changes  This may include eating more whole grains, fruits, vegetables, nuts, and fish.  This may also include cutting back on red meat and foods that have a lot of added sugar. Lifestyle changes  Changes may include getting at least 40  minutes of aerobic exercise 3 times a week. Aerobic exercises include walking, biking, and swimming. Aerobic exercise along with a healthy diet can help you maintain a healthy weight.  Changes may also include quitting smoking. Medicines  Medicines are usually given if diet and lifestyle changes have failed to reduce your cholesterol to healthy levels.  Your health care provider may prescribe a statin medicine. Statin medicines have been shown to reduce cholesterol, which can reduce the risk of heart disease. Follow these instructions at home: Eating and drinking If told by your health care provider:  Eat chicken (without skin), fish, veal, shellfish, ground [place] breast, and round or loin cuts of red meat.  Do not eat fried foods or fatty meats, such as hot dogs and salami.  Eat plenty of fruits, such as apples.  Eat plenty of vegetables, such as broccoli, potatoes, and carrots.  Eat beans, peas, and lentils.  Eat grains such as barley, rice, couscous, and bulgur wheat.  Eat pasta without cream sauces.  Use skim or nonfat milk, and eat low-fat or nonfat yogurt and cheeses.  Do not eat or drink whole milk, cream, ice cream, egg yolks, or hard cheeses.  Do not eat stick margarine or tub margarines that contain trans fats (also called partially hydrogenated oils).  Do not eat saturated tropical oils, such as coconut oil and palm oil.  Do not eat cakes, cookies, crackers, or other baked goods that contain trans fats.  General instructions  Exercise as directed by your health care provider. Increase your activity level with activities such as gardening, walking, and taking the stairs.  Take over-the-counter and prescription medicines only as told by your health care provider.  Do not use any products that contain nicotine or tobacco, such as cigarettes and e-cigarettes. If you need help quitting, ask your health care provider.  Keep all follow-up visits as told by your health  care provider. This is important. Contact a health care provider if:  You are struggling to maintain a healthy diet or weight.  You need help to start on an exercise program.  You need help to stop smoking. Get help right away if:  You have chest pain.  You have trouble breathing. This information is not intended to replace advice given to you by your health care provider. Make sure you discuss any questions you have with your health care provider. Document Revised: 06/14/2017 Document Reviewed: 12/10/2015 Elsevier Patient Education  [place].  Constipation, Adult Constipation is when a person has fewer bowel movements in a week than normal, has difficulty having a bowel movement, or has stools that are dry, hard, or larger than normal. Constipation may be caused by an underlying condition. It may become worse with age if a person takes certain medicines and does not take in enough fluids. Follow these instructions at home: Eating and drinking   Eat foods that have a lot of fiber, such as fresh fruits and vegetables, whole grains, and beans.  Limit foods that are high in fat, low in fiber, or overly processed, such as french fries, hamburgers, cookies, candies, and soda.  Drink enough fluid to keep your urine clear or pale yellow. General instructions  Exercise regularly or as told by your health care provider.  Go to the restroom when you have the urge to go. Do not hold it in.  Take over-the-counter and prescription medicines only as told by your health care provider. These include any fiber supplements.  Practice pelvic floor  retraining exercises, such as deep breathing while relaxing the lower abdomen and pelvic floor relaxation during bowel movements.  Watch your condition for any changes.  Keep all follow-up visits as told by your health care provider. This is important. Contact a health care provider if:  You have pain that gets worse.  You have a  fever.  You do not have a bowel movement after 4 days.  You vomit.  You are not hungry.  You lose weight.  You are bleeding from the anus.  You have thin, pencil-like stools. Get help right away if:  You have a fever and your symptoms suddenly get worse.  You leak stool or have blood in your stool.  Your abdomen is bloated.  You have severe pain in your abdomen.  You feel dizzy or you faint. This information is not intended to replace advice given to you by your health care provider. Make sure you discuss any questions you have with your health care provider. Document Revised: 05/24/2017 Document Reviewed: 11/30/2015 Elsevier Patient Education  [place].  Arthritis Arthritis means joint pain. It can also mean joint disease. A joint is a place where bones come together. There are more than 100 types of arthritis. What are the causes? This condition may be caused by:  Wear and tear of a joint. This is the most common cause.  A lot of acid in the blood, which leads to pain in the joint (gout).  Pain and swelling (inflammation) in a joint.  Infection of a joint.  Injuries in the joint.  A reaction to medicines (allergy). In some cases, the cause may not be known. What are the signs or symptoms? Symptoms of this condition include:  Redness at a joint.  Swelling at a joint.  Stiffness at a joint.  Warmth coming from the joint.  A fever.  A feeling of being sick. How is this treated? This condition may be treated with:  Treating the cause, if it is known.  Rest.  Raising (elevating) the joint.  Putting cold or hot packs on the joint.  Medicines to treat symptoms and reduce pain and swelling.  Shots of medicines (cortisone) into the joint. You may also be told to make changes in your life, such as doing exercises and losing weight. Follow these instructions at home: Medicines  Take over-the-counter and prescription medicines only as told  by your doctor.  Do not take aspirin for pain if your doctor says that you may have gout. Activity  Rest your joint if your doctor tells you to.  Avoid activities that make the pain worse.  Exercise your joint regularly as told by your doctor. Try doing exercises like: ? Swimming. ? Water aerobics. ? Biking. ? Walking. Managing pain, stiffness, and swelling      If told, put ice on the affected area. ? Put ice in a plastic bag. ? Place a towel between your skin and the bag. ? Leave the ice on for 20 minutes, 2-3 times per day.  If your joint is swollen, raise (elevate) it above the level of your heart if told by your doctor.  If your joint feels stiff in the morning, try taking a warm shower.  If told, put heat on the affected area. Do this as often as told by your doctor. Use the heat source that your doctor recommends, such as a moist heat pack or a heating pad. If you have diabetes, do not apply heat without asking your  doctor. To apply heat: ? Place a towel between your skin and the heat source. ? Leave the heat on for 20-30 minutes. ? Remove the heat if your skin turns bright red. This is very important if you are unable to feel pain, heat, or cold. You may have a greater risk of getting burned. General instructions  Do not use any products that contain nicotine or tobacco, such as cigarettes, e-cigarettes, and chewing tobacco. If you need help quitting, ask your doctor.  Keep all follow-up visits as told by your doctor. This is important. Contact a doctor if:  The pain gets worse.  You have a fever. Get help right away if:  You have very bad pain in your joint.  You have swelling in your joint.  Your joint is red.  Many joints become painful and swollen.  You have very bad back pain.  Your leg is very weak.  You cannot control your pee (urine) or poop (stool). Summary  Arthritis means joint pain. It can also mean joint disease. A joint is a place where  bones come together.  The most common cause of this condition is wear and tear of a joint.  Symptoms of this condition include redness, swelling, or stiffness of the joint.  This condition is treated with rest, raising the joint, medicines, and putting cold or hot packs on the joint.  Follow your doctor's instructions about medicines, activity, exercises, and other home care treatments. This information is not intended to replace advice given to you by your health care provider. Make sure you discuss any questions you have with your health care provider. Document Revised: 05/19/2018 Document Reviewed: 05/19/2018 Elsevier Patient Education  2020 [employer].

## 2020-02-26 ENCOUNTER — Other Ambulatory Visit (INDEPENDENT_AMBULATORY_CARE_PROVIDER_SITE_OTHER): Payer: Medicare Other

## 2020-02-26 ENCOUNTER — Other Ambulatory Visit: Payer: Self-pay

## 2020-02-26 DIAGNOSIS — G43809 Other migraine, not intractable, without status migrainosus: Secondary | ICD-10-CM

## 2020-02-26 DIAGNOSIS — E349 Endocrine disorder, unspecified: Secondary | ICD-10-CM | POA: Diagnosis not present

## 2020-02-26 DIAGNOSIS — M1990 Unspecified osteoarthritis, unspecified site: Secondary | ICD-10-CM | POA: Diagnosis not present

## 2020-02-26 DIAGNOSIS — K5909 Other constipation: Secondary | ICD-10-CM

## 2020-02-26 DIAGNOSIS — E785 Hyperlipidemia, unspecified: Secondary | ICD-10-CM

## 2020-02-29 ENCOUNTER — Other Ambulatory Visit: Payer: Self-pay | Admitting: Family Medicine

## 2020-02-29 DIAGNOSIS — E349 Endocrine disorder, unspecified: Secondary | ICD-10-CM

## 2020-03-01 LAB — CBC WITH DIFFERENTIAL/PLATELET
Absolute Monocytes: 628 cells/uL (ref 200–950)
Basophils Absolute: 92 cells/uL (ref 0–200)
Basophils Relative: 1.5 %
Eosinophils Absolute: 281 cells/uL (ref 15–500)
Eosinophils Relative: 4.6 %
HCT: 43.8 % (ref 38.5–50.0)
Hemoglobin: 14.4 g/dL (ref 13.2–17.1)
Lymphs Abs: 1842 cells/uL (ref 850–3900)
MCH: 31.6 pg (ref 27.0–33.0)
MCHC: 32.9 g/dL (ref 32.0–36.0)
MCV: 96.3 fL (ref 80.0–100.0)
MPV: 9.1 fL (ref 7.5–12.5)
Monocytes Relative: 10.3 %
Neutro Abs: 3257 cells/uL (ref 1500–7800)
Neutrophils Relative %: 53.4 %
Platelets: 303 10*3/uL (ref 140–400)
RBC: 4.55 10*6/uL (ref 4.20–5.80)
RDW: 12.6 % (ref 11.0–15.0)
Total Lymphocyte: 30.2 %
WBC: 6.1 10*3/uL (ref 3.8–10.8)

## 2020-03-01 LAB — COMPLETE METABOLIC PANEL WITH GFR
AG Ratio: 1.7 (calc) (ref 1.0–2.5)
ALT: 14 U/L (ref 9–46)
AST: 21 U/L (ref 10–35)
Albumin: 4.6 g/dL (ref 3.6–5.1)
Alkaline phosphatase (APISO): 69 U/L (ref 35–144)
BUN: 17 mg/dL (ref 7–25)
CO2: 25 mmol/L (ref 20–32)
Calcium: 9.7 mg/dL (ref 8.6–10.3)
Chloride: 102 mmol/L (ref 98–110)
Creat: 0.85 mg/dL (ref 0.70–1.25)
GFR, Est African American: 104 mL/min/{1.73_m2} (ref >=60)
GFR, Est Non African American: 90 mL/min/{1.73_m2} (ref >=60)
Globulin: 2.7 g/dL (calc) (ref 1.9–3.7)
Glucose, Bld: 99 mg/dL (ref 65–99)
Potassium: 4.3 mmol/L (ref 3.5–5.3)
Sodium: 141 mmol/L (ref 135–146)
Total Bilirubin: 0.5 mg/dL (ref 0.2–1.2)
Total Protein: 7.3 g/dL (ref 6.1–8.1)

## 2020-03-01 LAB — TSH: TSH: 2.14 mIU/L (ref 0.40–4.50)

## 2020-03-01 LAB — LIPID PANEL
Cholesterol: 161 mg/dL (ref <200)
HDL: 53 mg/dL (ref >=40)
LDL Cholesterol (Calc): 83 mg/dL (calc)
Non-HDL Cholesterol (Calc): 108 mg/dL (calc) (ref <130)
Total CHOL/HDL Ratio: 3 (calc) (ref <5.0)
Triglycerides: 146 mg/dL (ref <150)

## 2020-03-01 LAB — T4, FREE: Free T4: 1 ng/dL (ref 0.8–1.8)

## 2020-03-01 LAB — TESTOSTERONE TOTAL,FREE,BIO, MALES
Albumin: 4.6 g/dL (ref 3.6–5.1)
Sex Hormone Binding: 61 nmol/L (ref 22–77)
Testosterone, Bioavailable: 75.4 ng/dL — ABNORMAL LOW (ref >=110.0)
Testosterone, Free: 35.9 pg/mL — ABNORMAL LOW (ref 46.0–224.0)
Testosterone: 465 ng/dL (ref 250–827)

## 2020-03-03 NOTE — Telephone Encounter (Signed)
Pt LOV was on 02/24/2020 and last refill was done on 01/18/2020 for 75 g, please advise

## 2020-03-17 DIAGNOSIS — Z23 Encounter for immunization: Secondary | ICD-10-CM | POA: Diagnosis not present

## 2020-03-29 ENCOUNTER — Ambulatory Visit: Payer: Medicare Other | Attending: Internal Medicine

## 2020-03-29 DIAGNOSIS — Z23 Encounter for immunization: Secondary | ICD-10-CM

## 2020-03-29 NOTE — Progress Notes (Signed)
   Covid-19 Vaccination Clinic  Name:  Joseph Gill    MRN: [id number] DOB: 06/04/1951  03/29/2020  Joseph Gill was observed post Covid-19 immunization for 15 minutes without incident. He was provided with Vaccine Information Sheet and instruction to access the V-Safe system.   Joseph Gill was instructed to call 911 with any severe reactions post vaccine: Marland Kitchen Difficulty breathing  . Swelling of face and throat  . A fast heartbeat  . A bad rash all over body  . Dizziness and weakness

## 2020-07-13 ENCOUNTER — Other Ambulatory Visit: Payer: Self-pay | Admitting: Family Medicine

## 2020-07-13 DIAGNOSIS — M1990 Unspecified osteoarthritis, unspecified site: Secondary | ICD-10-CM

## 2020-07-14 NOTE — Telephone Encounter (Signed)
Last OV 02/24/20 Last refill 02/24/20 #30/2 Next OV not scheduled

## 2020-10-24 ENCOUNTER — Telehealth: Payer: Self-pay | Admitting: Family Medicine

## 2020-10-24 NOTE — Telephone Encounter (Signed)
vardenafil (LEVITRA) 10 MG tablet   [id number] [place] [address], [place] Phone:  [phone]  Fax:  [phone]

## 2020-11-07 ENCOUNTER — Other Ambulatory Visit: Payer: Self-pay | Admitting: Family Medicine

## 2020-11-07 DIAGNOSIS — M1990 Unspecified osteoarthritis, unspecified site: Secondary | ICD-10-CM

## 2020-11-11 NOTE — Telephone Encounter (Signed)
Can make appointment.

## 2020-12-28 ENCOUNTER — Other Ambulatory Visit: Payer: Self-pay | Admitting: Family Medicine

## 2020-12-28 DIAGNOSIS — G43809 Other migraine, not intractable, without status migrainosus: Secondary | ICD-10-CM

## 2021-02-20 ENCOUNTER — Encounter: Payer: Self-pay | Admitting: Family Medicine

## 2021-02-20 ENCOUNTER — Emergency Department (HOSPITAL_COMMUNITY): Payer: Medicare Other

## 2021-02-20 ENCOUNTER — Emergency Department (HOSPITAL_COMMUNITY)
Admission: EM | Admit: 2021-02-20 | Discharge: 2021-02-20 | Disposition: A | Discharge status: Home / Self Care | Payer: Medicare Other | Attending: Emergency Medicine | Admitting: Emergency Medicine

## 2021-02-20 ENCOUNTER — Other Ambulatory Visit: Payer: Self-pay

## 2021-02-20 ENCOUNTER — Ambulatory Visit (INDEPENDENT_AMBULATORY_CARE_PROVIDER_SITE_OTHER): Payer: Medicare Other | Admitting: Family Medicine

## 2021-02-20 ENCOUNTER — Encounter (HOSPITAL_COMMUNITY): Payer: Self-pay | Admitting: Emergency Medicine

## 2021-02-20 VITALS — BP 110/70 | HR 69 | Resp 16 | Ht 70.0 in | Wt 175.2 lb

## 2021-02-20 DIAGNOSIS — I4891 Unspecified atrial fibrillation: Secondary | ICD-10-CM | POA: Diagnosis not present

## 2021-02-20 DIAGNOSIS — R0602 Shortness of breath: Secondary | ICD-10-CM | POA: Diagnosis not present

## 2021-02-20 DIAGNOSIS — E785 Hyperlipidemia, unspecified: Secondary | ICD-10-CM | POA: Diagnosis not present

## 2021-02-20 DIAGNOSIS — R0789 Other chest pain: Secondary | ICD-10-CM | POA: Diagnosis not present

## 2021-02-20 DIAGNOSIS — I517 Cardiomegaly: Secondary | ICD-10-CM | POA: Diagnosis not present

## 2021-02-20 DIAGNOSIS — R9431 Abnormal electrocardiogram [ECG] [EKG]: Secondary | ICD-10-CM

## 2021-02-20 DIAGNOSIS — R002 Palpitations: Secondary | ICD-10-CM | POA: Diagnosis not present

## 2021-02-20 DIAGNOSIS — I499 Cardiac arrhythmia, unspecified: Secondary | ICD-10-CM | POA: Diagnosis not present

## 2021-02-20 DIAGNOSIS — I4510 Unspecified right bundle-branch block: Secondary | ICD-10-CM | POA: Diagnosis not present

## 2021-02-20 DIAGNOSIS — Z7982 Long term (current) use of aspirin: Secondary | ICD-10-CM | POA: Insufficient documentation

## 2021-02-20 DIAGNOSIS — R079 Chest pain, unspecified: Secondary | ICD-10-CM

## 2021-02-20 LAB — I-STAT CHEM 8, ED
BUN: 17 mg/dL (ref 8–23)
Calcium, Ion: 1.18 mmol/L (ref 1.15–1.40)
Chloride: 102 mmol/L (ref 98–111)
Creatinine, Ser: 0.8 mg/dL (ref 0.61–1.24)
Glucose, Bld: 109 mg/dL — ABNORMAL HIGH (ref 70–99)
HCT: 47 % (ref 39.0–52.0)
Hemoglobin: 16 g/dL (ref 13.0–17.0)
Potassium: 3.8 mmol/L (ref 3.5–5.1)
Sodium: 141 mmol/L (ref 135–145)
TCO2: 27 mmol/L (ref 22–32)

## 2021-02-20 LAB — COMPREHENSIVE METABOLIC PANEL
ALT: 24 U/L (ref 0–44)
AST: 28 U/L (ref 15–41)
Albumin: 4.3 g/dL (ref 3.5–5.0)
Alkaline Phosphatase: 52 U/L (ref 38–126)
Anion gap: 9 (ref 5–15)
BUN: 15 mg/dL (ref 8–23)
CO2: 27 mmol/L (ref 22–32)
Calcium: 9.3 mg/dL (ref 8.9–10.3)
Chloride: 102 mmol/L (ref 98–111)
Creatinine, Ser: 0.89 mg/dL (ref 0.61–1.24)
GFR, Estimated: >60 mL/min (ref >60)
Glucose, Bld: 112 mg/dL — ABNORMAL HIGH (ref 70–99)
Potassium: 3.6 mmol/L (ref 3.5–5.1)
Sodium: 138 mmol/L (ref 135–145)
Total Bilirubin: 0.8 mg/dL (ref 0.3–1.2)
Total Protein: 7.4 g/dL (ref 6.5–8.1)

## 2021-02-20 LAB — MAGNESIUM: Magnesium: 2 mg/dL (ref 1.7–2.4)

## 2021-02-20 LAB — CBC WITH DIFFERENTIAL/PLATELET
Abs Immature Granulocytes: 0.02 10*3/uL (ref 0.00–0.07)
Basophils Absolute: 0.1 10*3/uL (ref 0.0–0.1)
Basophils Relative: 1 %
Eosinophils Absolute: 0.3 10*3/uL (ref 0.0–0.5)
Eosinophils Relative: 4 %
HCT: 45.7 % (ref 39.0–52.0)
Hemoglobin: 15.1 g/dL (ref 13.0–17.0)
Immature Granulocytes: 0 %
Lymphocytes Relative: 27 %
Lymphs Abs: 2.1 10*3/uL (ref 0.7–4.0)
MCH: 32.4 pg (ref 26.0–34.0)
MCHC: 33 g/dL (ref 30.0–36.0)
MCV: 98.1 fL (ref 80.0–100.0)
Monocytes Absolute: 0.9 10*3/uL (ref 0.1–1.0)
Monocytes Relative: 11 %
Neutro Abs: 4.4 10*3/uL (ref 1.7–7.7)
Neutrophils Relative %: 57 %
Platelets: 270 10*3/uL (ref 150–400)
RBC: 4.66 MIL/uL (ref 4.22–5.81)
RDW: 13.4 % (ref 11.5–15.5)
WBC: 7.7 10*3/uL (ref 4.0–10.5)
nRBC: 0 % (ref 0.0–0.2)

## 2021-02-20 LAB — TROPONIN I (HIGH SENSITIVITY)
Troponin I (High Sensitivity): 9 ng/L (ref <18)
Troponin I (High Sensitivity): 8 ng/L (ref <18)

## 2021-02-20 MED ORDER — ASPIRIN EC 81 MG PO TBEC: 81.0000 mg | DELAYED_RELEASE_TABLET | Freq: Every day | ORAL | 0 refills | Status: DC

## 2021-02-20 MED ORDER — SODIUM CHLORIDE 0.9 % IV BOLUS
500.0000 mL | Freq: Once | INTRAVENOUS | Status: AC
  Administered 2021-02-20: 500 mL via INTRAVENOUS

## 2021-02-20 MED ORDER — ACETAMINOPHEN 325 MG PO TABS
650.0000 mg | ORAL_TABLET | Freq: Once | ORAL | Status: AC
  Administered 2021-02-20: 650 mg via ORAL
  Filled 2021-02-20: qty 2

## 2021-02-20 MED ORDER — METOPROLOL TARTRATE 25 MG PO TABS
25.0000 mg | ORAL_TABLET | Freq: Two times a day (BID) | ORAL | 0 refills | Status: DC
Start: 2021-02-20 — End: 2021-02-24

## 2021-02-20 MED ORDER — ASPIRIN 81 MG PO CHEW
324.0000 mg | CHEWABLE_TABLET | Freq: Once | ORAL | Status: AC
  Administered 2021-02-20: 324 mg via ORAL

## 2021-02-20 MED ORDER — DILTIAZEM HCL 25 MG/5ML IV SOLN
10.0000 mg | Freq: Once | INTRAVENOUS | Status: AC
  Administered 2021-02-20: 10 mg via INTRAVENOUS
  Filled 2021-02-20: qty 5

## 2021-02-20 MED ORDER — METOPROLOL TARTRATE 25 MG PO TABS
25.0000 mg | ORAL_TABLET | Freq: Once | ORAL | Status: AC
  Administered 2021-02-20: 25 mg via ORAL
  Filled 2021-02-20: qty 1

## 2021-02-20 NOTE — ED Notes (Signed)
Report received by [facility] RN

## 2021-02-20 NOTE — ED Triage Notes (Signed)
Pt BIB [facility] from PCP office, called out for CP. Pt states he felt generalized weakness all weekend and started having CP last night while sitting. Pt took tramadol to sleep last night. Pt went to PCP office today, PCP did EKG and saw RBBB, no hx. Per EMS, rate varies from 70s up to 160s. Denies SOB. Pt given 324 ASA at PCP office. Given 1 SL NTG with EMS.

## 2021-02-20 NOTE — ED Provider Notes (Signed)
[facility] EMERGENCY DEPARTMENT Provider Note   CSN: [id number] Arrival date & time: 02/20/21  1512     History Chief Complaint  Patient presents with   Chest Pain    Joseph Gill is a 70 y.o. male.  HPI 70 year old male presents with chest tightness, palpitations and lightheadedness.  Started about 3 days ago.  The lightheadedness and palpitations where his heart feels like it is racing has been coming and going all weekend.  He is not sure if they come together.  Last night he developed chest tightness that was mild but persistent.  Did not go away until he took tramadol and then he fell asleep.  He has still had chest tightness throughout the day today.  Went and saw his PCP, who gave him 4 baby aspirin and transferred him to the hospital via EMS.  He did state that he broke out into a sweat on the way to the doctor's office.  He denies radiation of the discomfort, shortness of breath or back pain.  He has felt like his abdomen has been a little painful like he ate too much food recently.  No leg swelling.  EMS gave patient 1 nitroglycerin that did not seem to do much besides giving him a headache.  They otherwise noticed that his heart rate would go from the 70s up to the 150s very quickly, lasting few minutes and then go back down.  They did have him do a Valsalva maneuver upon arriving here which seem to break the tachyarrhythmia.  Past Medical History:  Diagnosis Date   ALLERGIC RHINITIS 03/26/2007   Allergy    Back pain    knee and leg pain   Cataract    Chronically dry eyes, bilateral    Complication of anesthesia    Per pt, hard to wake up past certain sedation!   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   MIGRAINE HEADACHE 11/03/2008   Allergic rhinitis 03/26/2007    Past Surgical History:  Procedure Laterality Date   CATARACT EXTRACTION, BILATERAL  2015   NASAL SEPTUM SURGERY     NASAL SINUS SURGERY     TONSILLECTOMY         Family History  Problem Relation Age of Onset   Heart disease Mother    Heart disease Father    Lung cancer Maternal Uncle    Heart disease Maternal Grandfather    Prostate cancer Paternal Grandfather     Social History   Tobacco Use   Smoking status: Never   Smokeless tobacco: Never  Substance Use Topics   Alcohol use: Yes    Alcohol/week: 1.0 standard drink    Types: 1 Glasses of wine per week   Drug use: No    Home Medications Prior to Admission medications   Medication Sig Start Date End Date Taking? Authorizing Provider  aspirin EC 81 MG tablet Take 1 tablet (81 mg total) by mouth daily. Swallow whole. 02/20/21  Yes Sherwood Gambler, MD  metoprolol tartrate (LOPRESSOR) 25 MG tablet Take 1 tablet (25 mg total) by mouth 2 (two) times daily. 02/20/21  Yes Sherwood Gambler, MD  acetaminophen (TYLENOL) 500 MG tablet Take 500 mg by mouth as needed.    [provider]  amitriptyline (ELAVIL) 25 MG tablet TAKE 1 TABLET BY MOUTH EVERYDAY AT BEDTIME 12/28/20   Billie Ruddy, MD  atorvastatin (LIPITOR) 40 MG tablet Take 1 tablet (40 mg total) by mouth daily. 02/24/20   Billie Ruddy, MD  loratadine (CLARITIN) 10 MG tablet Take 10 mg by mouth as needed.     [provider]  mometasone (NASONEX) 50 MCG/ACT nasal spray PLACE 2 SPRAYS INTO THE NOSE  DAILY. 11/07/12   Marletta Lor, MD  Multiple Vitamin (MULTIVITAMIN) tablet Take 1 tablet by mouth daily.    [provider]  pseudoephedrine (SUDAFED) 30 MG tablet Take 30 mg by mouth as needed for congestion.    [provider]  SUMAtriptan (IMITREX)  100 MG tablet May repeat in 2 hours if headache persists or recurs. 02/24/20   Billie Ruddy, MD  Testosterone 20.25 MG/ACT (1.62%) GEL USE 2 PUMPS ON THE SKIN EVERY MORNING AS DIRECTED 03/04/20   Billie Ruddy, MD  traMADol (ULTRAM) 50 MG tablet TAKE 1 TABLET EVERY 12 HOURS AS NEEDED FOR ARTHRITIS PAIN. 11/07/20   Billie Ruddy, MD    Allergies    Erythromycin  Review of Systems   Review of Systems  Constitutional:  Positive for diaphoresis. Negative for fever.  Respiratory:  Negative for cough and shortness of breath.   Cardiovascular:  Positive for chest pain and palpitations. Negative for leg swelling.  Neurological:  Positive for light-headedness and headaches (since nitroglycerin).  All other systems reviewed and are negative.  Physical Exam Updated Vital Signs BP 140/84   Pulse 74   Temp 98.4 F (36.9 C)   Resp (!) 23   Ht '5\' 10"'$  (1.778 m)   Wt 79.5 kg   SpO2 94%   BMI 25.15 kg/m   Physical Exam Vitals and nursing note reviewed.  Constitutional:      Appearance: He is well-developed. He is not ill-appearing.  HENT:     Head: Normocephalic and atraumatic.     Right Ear: External ear normal.     Left Ear: External ear normal.     Nose: Nose normal.  Eyes:     General:        Right eye: No discharge.        Left eye: No discharge.  Cardiovascular:     Rate and Rhythm: Regular rhythm. Tachycardia present.     Heart sounds: Normal heart sounds.  Pulmonary:     Effort: Pulmonary effort is normal.     Breath sounds: Normal breath sounds.     Comments: Slight basilar crackles Abdominal:     Palpations: Abdomen is soft.     Tenderness: There is no abdominal tenderness.  Musculoskeletal:     Cervical back: Neck supple.     Right lower leg: No edema.     Left lower leg: No edema.  Skin:    General: Skin is warm and dry.  Neurological:     Mental Status: He is alert.  Psychiatric:        Mood and Affect: Mood is not anxious.    ED Results / Procedures /  Treatments   Labs (all labs ordered are listed, but only abnormal results are displayed) Labs Reviewed  COMPREHENSIVE METABOLIC PANEL - Abnormal; Notable for the following components:      Result Value   Glucose,  Bld 112 (*)    All other components within normal limits  I-STAT CHEM 8, ED - Abnormal; Notable for the following components:   Glucose, Bld 109 (*)    All other components within normal limits  CBC WITH DIFFERENTIAL/PLATELET  MAGNESIUM  TROPONIN I (HIGH SENSITIVITY)  TROPONIN I (HIGH SENSITIVITY)    EKG EKG Interpretation  Date/Time:  Monday February 20 2021 17:54:24 EDT Ventricular Rate:  82 PR Interval:  195 QRS Duration: 156 QT Interval:  399 QTC Calculation: 466 R Axis:   -50 Text Interpretation: Sinus rhythm Multiple premature complexes, vent & supraven RBBB and LAFB rapid rate is resolved. Confirmed by Sherwood Gambler [phone]) on 02/20/2021 6:01:53 PM  Radiology DG Chest Portable 1 View  Result Date: 02/20/2021 CLINICAL DATA:  Acute chest pain for 2 days, shortness of breath EXAM: PORTABLE CHEST 1 VIEW COMPARISON:  Portable exam 1559 hours compared to 07/19/2016 FINDINGS: Enlargement of cardiac silhouette. Mediastinal contours and pulmonary vascularity normal. Probable calcified AP window lymph nodes. Minimal biapical scarring greater on RIGHT. Lungs otherwise clear. No acute infiltrate, pleural effusion, or pneumothorax. IMPRESSION: No acute abnormalities. Enlargement of cardiac silhouette. Electronically Signed   By: Lavonia Dana M.D.   On: 02/20/2021 16:16    Procedures Procedures   Medications Ordered in ED Medications  sodium chloride 0.9 % bolus 500 mL (0 mLs Intravenous Stopped 02/20/21 1753)  diltiazem (CARDIZEM) injection 10 mg (10 mg Intravenous Given 02/20/21 1537)  acetaminophen (TYLENOL) tablet 650 mg (650 mg Oral Given 02/20/21 1539)  metoprolol tartrate (LOPRESSOR) tablet 25 mg (25 mg Oral Given 02/20/21 1813)    ED Course  I have reviewed the  triage vital signs and the nursing notes.  Pertinent labs & imaging results that were available during my care of the patient were reviewed by me and considered in my medical decision making (see chart for details).    MDM Rules/Calculators/A&P                           Patient presents with what appears to be new onset A. fib.  At times on the monitor he appears to have significantly irregular rate and is unclear if he has P waves or irregular atrial activity.  However his tachycardia seems to have resolved after diltiazem.  He was given oral metoprolol.  He seems to be much better and has troponins negative x2 as well as benign other work-up.  He feels comfortable going home and will refer to the A. fib clinic.  His [place] Vasc score is 1.  We discussed options and for now we will do aspirin and he will follow-up with A. fib clinic.  Doubt ACS, PE, dissection  CHA2DS2/VAS Stroke Risk Points  Current as of 3 hours ago     1 >= 2 Points: High Risk  1 - 1.99 Points: Medium Risk  0 Points: Low Risk    No Change      Details    This score determines the patient's risk of having a stroke if the  patient has atrial fibrillation.       Points Metrics  0 Has Congestive Heart Failure:  No    Current as of 3 hours ago  0 Has Vascular Disease:  No    Current as of 3 hours ago  0 Has Hypertension:  No    Current as of 3 hours ago  1 Age:  34    Current as of  3 hours ago  0 Has Diabetes:  No    Current as of 3 hours ago  0 Had Stroke:  No  Had TIA:  No  Had Thromboembolism:  No    Current as of 3 hours ago  0 Male:  No    Current as of 3 hours ago           Final Clinical Impression(s) / ED Diagnoses Final diagnoses:  New onset atrial fibrillation [facility])    Rx / DC Orders ED Discharge Orders          Ordered    Amb referral to AFIB Clinic        02/20/21 1915    metoprolol tartrate (LOPRESSOR) 25 MG tablet  2 times daily        02/20/21 1922    aspirin EC 81 MG tablet  Daily         02/20/21 Jac Canavan, MD 02/21/21 0002

## 2021-02-20 NOTE — Patient Instructions (Signed)
Sent to ER in ambulance.

## 2021-02-20 NOTE — Discharge Instructions (Addendum)
If you develop recurrent, continued, or worsening chest pain, shortness of breath, fever, vomiting, abdominal or back pain, or any other new/concerning symptoms then return to the ER for evaluation.  

## 2021-02-20 NOTE — Progress Notes (Addendum)
Chief Complaint  Patient presents with   Chest Pain    Started feeling bad on Friday, irregular heart rate, sweating and chest pain started yesterday.   HPI: Joseph Gill is a 70 y.o. male with hx of HLD,allergies,and low testosterone on hormonal replacement here today with above complaint. Woke up with chest pain Friday am. Left-sided chest pressure  Exertion does not aggravate symptoms.  No prior Hx of CAD. "Fainting" episode yesterday and here in the office, feeling lightheaded.  Chest Pain  This is a new problem. The current episode started yesterday. The onset quality is sudden. The pain is mild. The quality of the pain is described as pressure. The pain radiates to the precordial region. Associated symptoms include abdominal pain, diaphoresis, dizziness, irregular heartbeat, malaise/fatigue, near-syncope and palpitations. Pertinent negatives include no claudication, cough, exertional chest pressure, fever, headaches, nausea, orthopnea, PND, shortness of breath, vomiting or weakness.  Pertinent negatives for past medical history include no seizures.   He denies associated nausea or epigastric abdominal pain but just started with periumbilical abdominal pain. Negative for changes in bowel habits or urinary symptoms. He has not tried OTC medications. Hyperlipidemia on atorvastatin 40 mg daily.  Lab Results  Component Value Date   CHOL 161 02/26/2020   HDL 53 02/26/2020   LDLCALC 83 02/26/2020   LDLDIRECT 82.0 04/02/2019   TRIG 146 02/26/2020   CHOLHDL 3.0 02/26/2020   Lab Results  Component Value Date   CREATININE 0.85 02/26/2020   BUN 17 02/26/2020   NA 141 02/26/2020   K 4.3 02/26/2020   CL 102 02/26/2020   CO2 25 02/26/2020   Review of Systems  Constitutional:  Positive for diaphoresis and malaise/fatigue. Negative for fever and weight loss.  HENT:  Negative for congestion, nosebleeds and sore throat.   Eyes:  Negative for blurred vision and pain.   Respiratory:  Negative for cough, shortness of breath and wheezing.   Cardiovascular:  Positive for chest pain, palpitations and near-syncope. Negative for orthopnea, claudication, leg swelling and PND.  Gastrointestinal:  Positive for abdominal pain. Negative for heartburn, nausea and vomiting.       No changes in bowel habits.  Genitourinary:  Negative for dysuria and hematuria.  Musculoskeletal:  Negative for falls and myalgias.  Skin:  Negative for rash.  Neurological:  Positive for dizziness. Negative for tremors, speech change, focal weakness, seizures, loss of consciousness, weakness and headaches.  Endo/Heme/Allergies:  Does not bruise/bleed easily.  Psychiatric/Behavioral:  The patient is nervous/anxious.    No current facility-administered medications on file prior to visit.   Current Outpatient Medications on File Prior to Visit  Medication Sig Dispense Refill   acetaminophen (TYLENOL) 500 MG tablet Take 500 mg by mouth as needed.     amitriptyline (ELAVIL) 25 MG tablet TAKE 1 TABLET BY MOUTH EVERYDAY AT BEDTIME 90 tablet 1   atorvastatin (LIPITOR) 40 MG tablet Take 1 tablet (40 mg total) by mouth daily. 90 tablet 3   loratadine (CLARITIN) 10 MG tablet Take 10 mg by mouth as needed.      mometasone (NASONEX) 50 MCG/ACT nasal spray PLACE 2 SPRAYS INTO THE NOSE  DAILY. 17 g 2   Multiple Vitamin (MULTIVITAMIN) tablet Take 1 tablet by mouth daily.     pseudoephedrine (SUDAFED) 30 MG tablet Take 30 mg by mouth as needed for congestion.     SUMAtriptan (IMITREX) 100 MG tablet May repeat in 2 hours if headache persists or recurs. 10 tablet 11  Testosterone 20.25 MG/ACT (1.62%) GEL USE 2 PUMPS ON THE SKIN EVERY MORNING AS DIRECTED 75 g 0   traMADol (ULTRAM) 50 MG tablet TAKE 1 TABLET EVERY 12 HOURS AS NEEDED FOR ARTHRITIS PAIN. 30 tablet 2   Past Medical History:  Diagnosis Date   ALLERGIC RHINITIS 03/26/2007   Allergy    Back pain    knee and leg pain   Cataract    Chronically  dry eyes, bilateral    Complication of anesthesia    Per pt, hard to wake up past certain sedation!   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    Social History   Socioeconomic History   Marital status: Married    Spouse name: Not on file   Number of children: Not on file   Years of education: Not on file   Highest education level: Not on file  Occupational History   Not on file  Tobacco Use   Smoking status: Never   Smokeless tobacco: Never  Substance and Sexual Activity   Alcohol use: Yes    Alcohol/week: 1.0 standard drink    Types: 1 Glasses of wine per week   Drug use: No   Sexual activity: Not on file  Other Topics Concern   Not on file  Social History Narrative   Not on file   Social Determinants of Health   Financial Resource Strain: Not on file  Food Insecurity: Not on file  Transportation Needs: Not on file  Physical Activity: Not on file  Stress: Not on file  Social Connections: Not on file   Vitals:   02/20/21 1346  BP: 110/70  Pulse: 69  Resp: 16  SpO2: 97%   Body mass index is 25.15 kg/m. Physical Exam Nursing note reviewed.  Constitutional:      General: He is not in acute distress.    Appearance: He is well-developed. He is diaphoretic.  HENT:     Head: Normocephalic and atraumatic.  Eyes:     Conjunctiva/sclera: Conjunctivae normal.  Cardiovascular:     Rate and Rhythm: Normal rate. Rhythm irregular.     Pulses:          Posterior tibial pulses are 2+ on the right side and 2+ on the left side.     Heart sounds: Murmur (SEM I/VI RUSB) heard.  Pulmonary:     Effort: Pulmonary effort is normal. No respiratory distress.     Breath sounds: Normal breath sounds.  Abdominal:     Palpations: Abdomen is soft. There is no hepatomegaly or mass.     Tenderness: There is no abdominal  tenderness.  Skin:    General: Skin is warm.     Findings: No erythema or rash.  Neurological:     Mental Status: He is alert and oriented to person, place, and time.     Cranial Nerves: No cranial nerve deficit.     Gait: Gait normal.  Psychiatric:        Mood and Affect: Mood is anxious.     Comments: Well groomed, good eye contact.     ASSESSMENT AND PLAN:  Mr.Aulden was seen today for chest pain.  Diagnoses and all orders for this visit: Orders Placed This Encounter  Procedures   EKG 12-Lead   Chest pain, unspecified type Symptomatic  at this time. EKG today RBBB with LAFB and PVC's. No other EKG for comparison.  Discussed possible etiologies of chest pain, given the fact that he is having left-sided chest pressure associated with diaphoresis + presyncopal episode, recommend ER evaluation.  To evaluate for acute coronary syndrome.  Aspirin 81 mg x 4 orally given after verbal consent. O2 2 LPM [place].  Because BP mildly low, I did not give him sl nitro. EMS is here at this time and taking pt to [facility]   -     aspirin chewable tablet 324 mg  Abnormal EKG Per past medical history he has listed history of heart murmur and irregular heart rate, so EKG findings today may not be new.  Dyslipidemia Continue atorvastatin 40 mg daily.  I spent a total of 42 minutes in both face to face and non face to face activities for this visit on the date of this encounter. During this time history was obtained and documented, examination was performed, prior labs reviewed, and assessment/plan discussed.  Return for Sent to ER via EMS.  Addley Ballinger G. [place], MD  [facility]. [place] office.

## 2021-02-20 NOTE — Progress Notes (Deleted)
ACUTE VISIT Chief Complaint  Patient presents with   Chest Pain    Started feeling bad on Friday, irregular heart rate, sweating and chest pain started yesterday.   HPI: Joseph Gill is a 70 y.o. 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 No known hx of CAD.  Chest Pain  This is a new problem. The current episode started in the past 7 days. The onset quality is sudden. The problem has been waxing and waning. The quality of the pain is described as pressure. The pain does not radiate. Associated symptoms include abdominal pain, diaphoresis, dizziness, irregular heartbeat, malaise/fatigue, near-syncope and palpitations. Pertinent negatives include no back pain, claudication, cough, exertional chest pressure, hemoptysis, leg pain, lower extremity edema, orthopnea, PND, shortness of breath, sputum production, syncope or vomiting. The pain is aggravated by nothing. He has tried nothing for the symptoms. Risk factors include hormone replacement therapy and male gender.  His past medical history is significant for arrhythmia and hyperlipidemia.  His family medical history is significant for CAD.   Review of Systems  Constitutional:  Positive for diaphoresis and malaise/fatigue.  Respiratory:  Negative for cough, hemoptysis, sputum production and shortness of breath.   Cardiovascular:  Positive for chest pain, palpitations and near-syncope. Negative for orthopnea, claudication, syncope and PND.  Gastrointestinal:  Positive for abdominal pain. Negative for vomiting.  Musculoskeletal:  Negative for back pain.  Neurological:  Positive for dizziness.  Rest see pertinent positives and negatives per HPI.  Current Outpatient Medications on File Prior to Visit  Medication Sig Dispense Refill   acetaminophen (TYLENOL) 500 MG tablet Take 500 mg by mouth as needed.     amitriptyline (ELAVIL) 25 MG tablet  TAKE 1 TABLET BY MOUTH EVERYDAY AT BEDTIME 90 tablet 1   atorvastatin (LIPITOR) 40 MG tablet Take 1 tablet (40 mg total) by mouth daily. 90 tablet 3   loratadine (CLARITIN) 10 MG tablet Take 10 mg by mouth as needed.      mometasone (NASONEX) 50 MCG/ACT nasal spray PLACE 2 SPRAYS INTO THE NOSE  DAILY. 17 g 2   Multiple Vitamin (MULTIVITAMIN) tablet Take 1 tablet by mouth daily.     pseudoephedrine (SUDAFED) 30 MG tablet Take 30 mg by mouth as needed for congestion.     SUMAtriptan (IMITREX) 100 MG tablet May repeat in 2 hours if headache persists or recurs. 10 tablet 11   Testosterone 20.25 MG/ACT (1.62%) GEL USE 2 PUMPS ON THE SKIN EVERY MORNING AS DIRECTED 75 g 0   traMADol (ULTRAM) 50 MG tablet TAKE 1 TABLET EVERY 12 HOURS AS NEEDED FOR ARTHRITIS PAIN. 30 tablet 2   No current facility-administered medications on file prior to visit.     Past Medical History:  Diagnosis Date   ALLERGIC RHINITIS 03/26/2007   Allergy    Back pain    knee and leg pain   Cataract    Chronically dry eyes, bilateral    Complication of anesthesia    Per pt, hard to wake up past certain sedation!   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    Social History   Socioeconomic History   Marital status: Married    Spouse name: Not on file   Number  of children: Not on file   Years of education: Not on file   Highest education level: Not on file  Occupational History   Not on file  Tobacco Use   Smoking status: Never   Smokeless tobacco: Never  Substance and Sexual Activity   Alcohol use: Yes    Alcohol/week: 1.0 standard drink    Types: 1 Glasses of wine per week   Drug use: No   Sexual activity: Not on file  Other Topics Concern   Not on file  Social History Narrative   Not on file   Social Determinants of Health   Financial  Resource Strain: Not on file  Food Insecurity: Not on file  Transportation Needs: Not on file  Physical Activity: Not on file  Stress: Not on file  Social Connections: Not on file    Vitals:   02/20/21 1346  BP: 110/70  Pulse: 69  SpO2: 97%   Body mass index is 25.15 kg/m.  Physical Exam  ASSESSMENT AND PLAN:  Joseph Gill was seen today for chest pain.  Diagnoses and all orders for this visit:  Chest pain, unspecified type -     EKG 12-Lead     No follow-ups on file.   Kay Shippy G. Martinique, MD  [facility]. [place] office.  Discharge Instructions   None

## 2021-02-23 ENCOUNTER — Other Ambulatory Visit: Payer: Self-pay

## 2021-02-23 ENCOUNTER — Ambulatory Visit (HOSPITAL_BASED_OUTPATIENT_CLINIC_OR_DEPARTMENT_OTHER)
Admission: RE | Admit: 2021-02-23 | Discharge: 2021-02-23 | Disposition: A | Discharge status: Home / Self Care | Payer: Medicare Other | Source: Ambulatory Visit | Attending: Physician Assistant | Admitting: Physician Assistant

## 2021-02-23 ENCOUNTER — Encounter (HOSPITAL_COMMUNITY): Payer: Self-pay | Admitting: Physician Assistant

## 2021-02-23 ENCOUNTER — Ambulatory Visit (HOSPITAL_COMMUNITY)
Admission: RE | Admit: 2021-02-23 | Discharge: 2021-02-23 | Disposition: A | Discharge status: Home / Self Care | Payer: Medicare Other | Source: Ambulatory Visit | Attending: Physician Assistant | Admitting: Physician Assistant

## 2021-02-23 VITALS — BP 114/80 | HR 71 | Ht 70.0 in | Wt 172.8 lb

## 2021-02-23 DIAGNOSIS — Z7982 Long term (current) use of aspirin: Secondary | ICD-10-CM | POA: Diagnosis not present

## 2021-02-23 DIAGNOSIS — I483 Typical atrial flutter: Secondary | ICD-10-CM | POA: Diagnosis not present

## 2021-02-23 DIAGNOSIS — Z7901 Long term (current) use of anticoagulants: Secondary | ICD-10-CM | POA: Insufficient documentation

## 2021-02-23 DIAGNOSIS — I517 Cardiomegaly: Secondary | ICD-10-CM | POA: Insufficient documentation

## 2021-02-23 DIAGNOSIS — E785 Hyperlipidemia, unspecified: Secondary | ICD-10-CM | POA: Insufficient documentation

## 2021-02-23 DIAGNOSIS — R0681 Apnea, not elsewhere classified: Secondary | ICD-10-CM | POA: Diagnosis not present

## 2021-02-23 DIAGNOSIS — Z79899 Other long term (current) drug therapy: Secondary | ICD-10-CM | POA: Diagnosis not present

## 2021-02-23 DIAGNOSIS — R400 Somnolence: Secondary | ICD-10-CM | POA: Diagnosis not present

## 2021-02-23 DIAGNOSIS — Z8249 Family history of ischemic heart disease and other diseases of the circulatory system: Secondary | ICD-10-CM | POA: Insufficient documentation

## 2021-02-23 DIAGNOSIS — R0683 Snoring: Secondary | ICD-10-CM | POA: Insufficient documentation

## 2021-02-23 DIAGNOSIS — I4891 Unspecified atrial fibrillation: Secondary | ICD-10-CM | POA: Insufficient documentation

## 2021-02-23 LAB — ECHOCARDIOGRAM COMPLETE
AR max vel: 1.89 cm2
AV Area VTI: 2.4 cm2
AV Area mean vel: 1.86 cm2
AV Mean grad: 1 mmHg
AV Peak grad: 2.7 mmHg
Ao pk vel: 0.82 m/s
Height: 70 in
S' Lateral: 3.7 cm
Weight: 2764.8 oz

## 2021-02-23 MED ORDER — PERFLUTREN LIPID MICROSPHERE
1.0000 mL | INTRAVENOUS | Status: AC | PRN
  Administered 2021-02-23: 2 mL via INTRAVENOUS
  Filled 2021-02-23: qty 10

## 2021-02-23 MED ORDER — APIXABAN 5 MG PO TABS: 5.0000 mg | ORAL_TABLET | Freq: Two times a day (BID) | ORAL | 3 refills | Status: DC

## 2021-02-23 NOTE — Patient Instructions (Signed)
Stop aspirin  Start Eliquis 5 mg twice a day.

## 2021-02-23 NOTE — Progress Notes (Signed)
Primary Care Physician: Billie Ruddy, MD Primary Cardiologist: none Primary Electrophysiologist: none Referring Physician: [facility]   Joseph Gill is a 70 y.o. male with a history of HLD and atrial flutter who presents for consultation in the [facility]. The patient was initially diagnosed with atrial flutter 02/20/21. Patient presented to his PCP with 3 days of chest tightness, palpitations, and lightheadedness. He also had diaphoresis on the way to his appointment. His PCP referred him to the ED for evaluation. ECG showed intermittent tachycardia, appears like atrial flutter. Patient has a CHADS2VASC score of 1. He was started on metoprolol for rate control. In hindsight, he has had heart racing while working in his yard for the past 2-3 months. He is in atrial flutter today with palpitations. He denies alcohol use but does admit to snoring and daytime somnolence.   Today, he denies symptoms of chest pain, shortness of breath, orthopnea, PND, lower extremity edema, dizziness, presyncope, syncope, bleeding, or neurologic sequela. The patient is tolerating medications without difficulties and is otherwise without complaint today.    Atrial Fibrillation Risk Factors:  he does have symptoms or diagnosis of sleep apnea. he does not have a history of rheumatic fever. he does not have a history of alcohol use. The patient does not have a history of early familial atrial fibrillation or other arrhythmias.  he has a BMI of Body mass index is 24.79 kg/m.Marland Kitchen Filed Weights   02/23/21 0926  Weight: 78.4 kg    Family History  Problem Relation Age of Onset   Heart disease Mother    Heart disease Father    Lung cancer Maternal Uncle    Heart disease Maternal Grandfather    Prostate cancer Paternal Grandfather      Atrial Fibrillation Management history:  Previous antiarrhythmic drugs: none Previous cardioversions: none Previous ablations:  none CHADS2VASC score: 1 Anticoagulation history: none   Past Medical History:  Diagnosis Date   ALLERGIC RHINITIS 03/26/2007   Allergy    Back pain    knee and leg pain   Cataract    Chronically dry eyes, bilateral    Complication of anesthesia    Per pt, hard to wake up past certain sedation!   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     amitriptyline (ELAVIL) 25 MG tablet TAKE 1 TABLET BY MOUTH EVERYDAY AT BEDTIME 90 tablet 1   aspirin EC 81 MG tablet Take 1 tablet (81 mg total) by mouth daily. Swallow whole. 30 tablet 0   atorvastatin (LIPITOR) 40 MG tablet Take 1 tablet (40 mg total) by mouth daily. 90 tablet 3   loratadine (CLARITIN) 10 MG tablet Take 10 mg by mouth as needed.      metoprolol tartrate (LOPRESSOR) 25 MG tablet Take 1 tablet (25 mg total) by mouth 2 (two) times daily. 60 tablet 0   mometasone (NASONEX) 50 MCG/ACT nasal spray PLACE 2 SPRAYS INTO THE NOSE  DAILY. 17 g 2   Multiple Vitamin (MULTIVITAMIN) tablet Take 1 tablet  by mouth daily.     pseudoephedrine (SUDAFED) 30 MG tablet Take 30 mg by mouth as needed for congestion.     SUMAtriptan (IMITREX) 100 MG tablet May repeat in 2 hours if headache persists or recurs. 10 tablet 11   Testosterone 20.25 MG/ACT (1.62%) GEL USE 2 PUMPS ON THE SKIN EVERY MORNING AS DIRECTED 75 g 0   traMADol (ULTRAM) 50 MG tablet TAKE 1 TABLET EVERY 12 HOURS AS NEEDED FOR ARTHRITIS PAIN. 30 tablet 2   No current facility-administered medications for this encounter.    Allergies  Allergen Reactions   Erythromycin     Feels terrible!     Social History   Socioeconomic History   Marital status: Married    Spouse name: Not on file   Number of children: Not on file   Years of education: Not on file   Highest education level: Not on file  Occupational History   Not on file  Tobacco Use   Smoking status: Never   Smokeless tobacco: Never  Substance and Sexual Activity   Alcohol use: Yes    Alcohol/week: 4.0 standard drinks    Types: 1 Glasses of wine, 1 Cans of beer, 1 Shots of liquor, 1 Standard drinks or equivalent per week    Comment: occ   Drug use: No   Sexual activity: Not on file  Other Topics Concern   Not on file  Social History Narrative   Not on file   Social Determinants of Health   Financial Resource Strain: Not on file  Food Insecurity: Not on file  Transportation Needs: Not on file  Physical Activity: Not on file  Stress: Not on file  Social Connections: Not on file  Intimate Partner Violence: Not on file     ROS- All systems are reviewed and negative except as per the HPI above.  Physical Exam: Vitals:   02/23/21 0926  BP: 114/80  Pulse: 71  Weight: 78.4 kg  Height: '5\' 10"'$  (1.778 m)    GEN- The patient is a well appearing male, alert and oriented x 3 today.   Head- normocephalic, atraumatic Eyes-  Sclera clear, conjunctiva pink Ears- hearing intact Oropharynx- clear Neck- supple  Lungs- Clear to ausculation bilaterally, normal work of breathing Heart- irregular rate and rhythm, no murmurs, rubs or gallops  GI- soft, NT, ND, + BS Extremities- no clubbing, cyanosis, or edema MS- no significant deformity or atrophy Skin- no rash or lesion Psych- euthymic mood, full affect Neuro- strength and sensation are intact  Wt Readings from Last 3 Encounters:  02/23/21 78.4 kg  02/20/21 79.5 kg  02/20/21 79.5 kg    EKG today demonstrates  Typical atrial flutter with variable block, RBBB Vent. rate 71 BPM PR interval * ms QRS duration 154 ms QT/QTcB 416/452 ms  Epic records  are reviewed at length today  CHA2DS2-VASc Score = 1  The patient's score is based upon: CHF History: No HTN History: No Diabetes History: No Stroke History: No Vascular Disease History: No Age Score: 1 Gender Score: 0     ASSESSMENT AND PLAN: 1. Atrial flutter The patient's CHA2DS2-VASc score is 1, indicating a 0.6% annual risk of stroke.   General education about atrial flutter provided and questions answered.  We also discussed therapeutic options including AAD vs ablation. He is agreeable to referral to EP to discuss ablation. Stop ASA and start Eliquis 5 mg BID Check echocardiogram Continue Lopressor 25 mg BID  2. Snoring/daytime somnolence  The importance  of adequate treatment of sleep apnea was discussed today in order to improve our ability to maintain sinus rhythm long term. He did have a sleep study 9 years ago (in [place]) which showed apneic episodes but did not meet criteria for OSA. He does snore loudly and has daytime somnolence. Will refer for sleep study.    Follow up with EP for ablation consideration.    [facility] [address] [place], [place] [address] [phone] 02/23/2021 9:37 AM

## 2021-02-24 ENCOUNTER — Telehealth: Payer: Self-pay | Admitting: *Deleted

## 2021-02-24 ENCOUNTER — Ambulatory Visit: Payer: Medicare Other

## 2021-02-24 ENCOUNTER — Other Ambulatory Visit (HOSPITAL_COMMUNITY): Payer: Self-pay | Admitting: *Deleted

## 2021-02-24 MED ORDER — METOPROLOL SUCCINATE ER 25 MG PO TB24: 25.0000 mg | ORAL_TABLET | Freq: Every day | ORAL | 3 refills | Status: DC

## 2021-02-24 NOTE — Telephone Encounter (Signed)
Left sleep study appointment details on voice mail. 

## 2021-03-07 ENCOUNTER — Ambulatory Visit (INDEPENDENT_AMBULATORY_CARE_PROVIDER_SITE_OTHER): Payer: Medicare Other

## 2021-03-07 ENCOUNTER — Other Ambulatory Visit: Payer: Self-pay

## 2021-03-07 VITALS — BP 100/64 | HR 83 | Temp 98.5°F | Wt 172.9 lb

## 2021-03-07 DIAGNOSIS — Z0000 Encounter for general adult medical examination without abnormal findings: Secondary | ICD-10-CM

## 2021-03-07 NOTE — Progress Notes (Addendum)
Subjective:   Joseph Gill is a 70 y.o. male who presents for Medicare Annual/Subsequent preventive examination.  Review of Systems     Cardiac Risk Factors include: male gender;dyslipidemia;advanced age (>12mn, >>33women)     Objective:    Today's Vitals   03/07/21 1332  BP: 100/64  Pulse: 83  Temp: 98.5 F (36.9 C)  SpO2: 97%  Weight: 172 lb 14.4 oz (78.4 kg)   Body mass index is 24.81 kg/m.  Advanced Directives 03/07/2021 02/20/2021 07/17/2016  Does Patient Have a Medical Advance Directive? Yes No Yes  Type of Advance Directive [place] Chart? No - copy requested - No - copy requested    Current Medications (verified) Outpatient Encounter Medications as of 03/07/2021  Medication Sig   acetaminophen (TYLENOL) 500 MG tablet Take 500 mg by mouth as needed.   amitriptyline (ELAVIL) 25 MG tablet TAKE 1 TABLET BY MOUTH EVERYDAY AT BEDTIME   apixaban (ELIQUIS) 5 MG TABS tablet Take 1 tablet (5 mg total) by mouth 2 (two) times daily.   atorvastatin (LIPITOR) 40 MG tablet Take 1 tablet (40 mg total) by mouth daily.   loratadine (CLARITIN) 10 MG tablet Take 10 mg by mouth as needed.    metoprolol succinate (TOPROL XL) 25 MG 24 hr tablet Take 1 tablet (25 mg total) by mouth daily.   mometasone (NASONEX) 50 MCG/ACT nasal spray PLACE 2 SPRAYS INTO THE NOSE  DAILY.   Multiple Vitamin (MULTIVITAMIN) tablet Take 1 tablet by mouth daily.   pseudoephedrine (SUDAFED) 30 MG tablet Take 30 mg by mouth as needed for congestion.   SUMAtriptan (IMITREX) 100 MG tablet May repeat in 2 hours if headache persists or recurs.   Testosterone 20.25 MG/ACT (1.62%) GEL USE 2 PUMPS ON THE SKIN EVERY MORNING AS DIRECTED   traMADol (ULTRAM) 50 MG tablet TAKE 1 TABLET EVERY 12 HOURS AS NEEDED FOR ARTHRITIS PAIN.   No facility-administered encounter medications on file as of 03/07/2021.     Allergies (verified) Erythromycin   History: Past Medical History:  Diagnosis Date   ALLERGIC RHINITIS 03/26/2007   Allergy    Back pain    knee and leg pain   Cataract    Chronically dry eyes, bilateral    Complication of anesthesia    Per pt, hard to wake up past certain sedation!   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   Heart murmur    Hyperlipidemia    Irregular heart beat    MIGRAINE HEADACHE 11/03/2008   Past Surgical History:  Procedure Laterality Date   CATARACT EXTRACTION, BILATERAL  2015   NASAL SEPTUM SURGERY     NASAL SINUS SURGERY     TONSILLECTOMY     Family History  Problem Relation Age of Onset   Heart disease Mother    Heart disease Father    Lung cancer Maternal Uncle    Heart disease Maternal Grandfather    Prostate cancer Paternal Grandfather    Social History   Socioeconomic History   Marital status: Married    Spouse name: Not on file   Number of children: Not on file   Years of education: Not on file   Highest education level: Not on file  Occupational History   Not on file  Tobacco Use   Smoking status: Never   Smokeless tobacco: Never  Substance and Sexual Activity   Alcohol use: Yes  Alcohol/week: 4.0 standard drinks    Types: 1 Glasses of wine, 1 Cans of beer, 1 Shots of liquor, 1 Standard drinks or equivalent per week    Comment: occ   Drug use: No   Sexual activity: Not on file  Other Topics Concern   Not on file  Social History Narrative   Not on file   Social Determinants of Health   Financial Resource Strain: Low Risk    Difficulty of Paying Living Expenses: Not hard at all  Food Insecurity: No Food Insecurity   Worried About Charity fundraiser in the Last Year: Never true   Ran Out of Food in the Last Year: Never true  Transportation Needs: No Transportation Needs   Lack of Transportation (Medical): No   Lack of Transportation (Non-Medical): No  Physical Activity: Inactive   Days of Exercise per Week: 0  days   Minutes of Exercise per Session: 0 min  Stress: No Stress Concern Present   Feeling of Stress : Not at all  Social Connections: Socially Isolated   Frequency of Communication with Friends and Family: Once a week   Frequency of Social Gatherings with Friends and Family: Once a week   Attends Religious Services: Never   Marine scientist or Organizations: No   Attends Music therapist: Never   Marital Status: Married    Tobacco Counseling Counseling given: Not Answered   Clinical Intake:  Pre-visit preparation completed: Yes  Pain : No/denies pain     BMI - recorded: 24.81 Nutritional Status: BMI of 19-24  Normal Nutritional Risks: None Diabetes: No  How often do you need to have someone help you when you read instructions, pamphlets, or other written materials from your doctor or pharmacy?: 1 - Never  Diabetic?No  Interpreter Needed?: No  Information entered by :: Charlott Rakes, LPN   Activities of Daily Living In your present state of health, do you have any difficulty performing the following activities: 03/07/2021  Hearing? N  Vision? N  Difficulty concentrating or making decisions? N  Walking or climbing stairs? N  Dressing or bathing? N  Doing errands, shopping? N  Preparing Food and eating ? N  Using the Toilet? N  In the past six months, have you accidently leaked urine? N  Do you have problems with loss of bowel control? N  Managing your Medications? N  Managing your Finances? N  Housekeeping or managing your Housekeeping? N  Some recent data might be hidden    Patient Care Team: Billie Ruddy, MD as PCP - General (Family Medicine)  Indicate any recent Medical Services you may have received from other than [facility] providers in the past year (date may be approximate).     Assessment:   This is a routine wellness examination for Joseph Gill.  Hearing/Vision screen Hearing Screening - Comments:: Pt states HOH Vision Screening  - Comments:: Pt [facility] with [facility] for annual [facility] exams   Dietary issues and exercise activities discussed: Current Exercise Habits: The patient does not participate in regular exercise at present   Goals Addressed             This Visit's Progress    Patient Stated       Get back to exercise        Depression Screen PHQ 2/9 Scores 03/07/2021 02/24/2020 01/26/2014  PHQ - 2 Score 0 0 0    Fall Risk Fall Risk  03/07/2021 02/24/2020  Falls in  the past year? 0 0  Number falls in past yr: 0 -  Injury with Fall? 0 -  Risk for fall due to : Impaired vision -  Follow up Falls prevention discussed -    FALL RISK PREVENTION PERTAINING TO THE HOME:  Any stairs in or around the home? Yes  If so, are there any without handrails? No  Home free of loose throw rugs in walkways, pet beds, electrical cords, etc? Yes  Adequate lighting in your home to reduce risk of falls? Yes   ASSISTIVE DEVICES UTILIZED TO PREVENT FALLS:  Life alert? No  Use of a cane, walker or w/c? No  Grab bars in the bathroom? No  Shower chair or bench in shower? No  Elevated toilet seat or a handicapped toilet? No   TIMED UP AND GO:  Was the test performed? Yes .  Length of time to ambulate 10 feet: 10 sec.   Gait steady and fast without use of assistive device  Cognitive Function:     6CIT Screen 03/07/2021  What Year? 0 points  What month? 0 points  What time? 0 points  Count back from 20 0 points  Months in reverse 0 points  Repeat phrase 0 points  Total Score 0    Immunizations Immunization History  Administered Date(s) Administered   Fluad Quad(high Dose 65+) 03/17/2020   Influenza Split 04/27/2011, 03/18/2012   Influenza Whole 04/09/2007, 03/27/2008, 03/29/2009, 03/07/2010   Influenza, High Dose Seasonal PF 03/27/2017, 03/31/2018, 04/03/2019   Influenza, Seasonal, Injecte, Preservative Fre 03/10/2015   Influenza-Unspecified 03/31/2014, 03/15/2016, 03/27/2017   PFIZER(Purple  Top)SARS-COV-2 Vaccination 08/01/2019, 08/26/2019, 03/29/2020, 10/18/2020   Pneumococcal Conjugate-13 04/24/2017   Pneumococcal Polysaccharide-23 04/24/2018   Td 06/25/1996   Tdap 01/26/2014   Zoster Recombinat (Shingrix) 11/09/2019, 02/12/2020   Zoster, Live 03/25/2012    TDAP status: Up to date  Flu Vaccine status: Due, Education has been provided regarding the importance of this vaccine. Advised may receive this vaccine at local pharmacy or Health Dept. Aware to provide a copy of the vaccination record if obtained from local pharmacy or Health Dept. Verbalized acceptance and understanding.  Pneumococcal vaccine status: Up to date  Covid-19 vaccine status: Completed vaccines  Qualifies for Shingles Vaccine? Yes   Zostavax completed Yes   Shingrix Completed?: Yes  Screening Tests Health Maintenance  Topic Date Due   INFLUENZA VACCINE  01/23/2021   COLONOSCOPY (Pts 45-68yr Insurance coverage will need to be confirmed)  07/17/2021   TETANUS/TDAP  01/27/2024   COVID-19 Vaccine  Completed   Hepatitis C Screening  Completed   PNA vac Low Risk Adult  Completed   Zoster Vaccines- Shingrix  Completed   HPV VACCINES  Aged Out    Health Maintenance  Health Maintenance Due  Topic Date Due   INFLUENZA VACCINE  01/23/2021    Colorectal cancer screening: Type of screening: Colonoscopy. Completed 07/17/16. Repeat every 5 years   Additional Screening:  Hepatitis C Screening:  Completed 03/01/16  Vision Screening: Recommended annual ophthalmology exams for early detection of glaucoma and other disorders of the eye. Is the patient up to date with their annual eye exam?  Yes  Who is the provider or what is the name of the office in which the patient attends annual eye exams? [facility]  If pt is not established with a provider, would they like to be referred to a provider to establish care? No .   Dental Screening: Recommended annual dental exams for  proper oral  hygiene  Community Resource Referral / Chronic Care Management: CRR required this visit?  No   CCM required this visit?  No      Plan:     I have personally reviewed and noted the following in the patient's chart:   Medical and social history Use of alcohol, tobacco or illicit drugs  Current medications and supplements including opioid prescriptions. Patient is currently taking opioid prescriptions. Information provided to patient regarding non-opioid alternatives. Patient advised to discuss non-opioid treatment plan with their provider. Functional ability and status Nutritional status Physical activity Advanced directives List of other physicians Hospitalizations, surgeries, and ER visits in previous 12 months Vitals Screenings to include cognitive, depression, and falls Referrals and appointments  In addition, I have reviewed and discussed with patient certain preventive protocols, quality metrics, and best practice recommendations. A written personalized care plan for preventive services as well as general preventive health recommendations were provided to patient.     Willette Brace, LPN   [phone]   Nurse Notes: None

## 2021-03-07 NOTE — Patient Instructions (Signed)
Joseph Gill , Thank you for taking time to come for your Medicare Wellness Visit. I appreciate your ongoing commitment to your health goals. Please review the following plan we discussed and let me know if I can assist you in the future.   Screening recommendations/referrals: Colonoscopy: Done 07/17/16 repeat every 5 years due 07/17/21 Recommended yearly ophthalmology/optometry visit for glaucoma screening and checkup Recommended yearly dental visit for hygiene and checkup  Vaccinations: Influenza vaccine: Due Pneumococcal vaccine: Completed  Tdap vaccine: Done 01/26/14 repeat every 10 years  Shingles vaccine: Completed 5/17 & 02/12/20   Covid-19: Completed 2/6, 3/3, 03/29/20  Advanced directives: Please bring a copy of your health care power of attorney and living will to the office at your convenience.  Conditions/risks identified: Get back to the gym  Next appointment: Follow up in one year for your annual wellness visit.   Preventive Care 53 Years and Older, Male Preventive care refers to lifestyle choices and visits with your health care provider that can promote health and wellness. What does preventive care include? A yearly physical exam. This is also called an annual well check. Dental exams once or twice a year. Routine eye exams. Ask your health care provider how often you should have your eyes checked. Personal lifestyle choices, including: Daily care of your teeth and gums. Regular physical activity. Eating a healthy diet. Avoiding tobacco and drug use. Limiting alcohol use. Practicing safe sex. Taking low doses of aspirin every day. Taking vitamin and mineral supplements as recommended by your health care provider. What happens during an annual well check? The services and screenings done by your health care provider during your annual well check will depend on your age, overall health, lifestyle risk factors, and family history of disease. Counseling  Your health care  provider may ask you questions about your: Alcohol use. Tobacco use. Drug use. Emotional well-being. Home and relationship well-being. Sexual activity. Eating habits. History of falls. Memory and ability to understand (cognition). Work and work Statistician. Screening  You may have the following tests or measurements: Height, weight, and BMI. Blood pressure. Lipid and cholesterol levels. These may be checked every 5 years, or more frequently if you are over 33 years old. Skin check. Lung cancer screening. You may have this screening every year starting at age 44 if you have a 30-pack-year history of smoking and currently smoke or have quit within the past 15 years. Fecal occult blood test (FOBT) of the stool. You may have this test every year starting at age 67. Flexible sigmoidoscopy or colonoscopy. You may have a sigmoidoscopy every 5 years or a colonoscopy every 10 years starting at age 49. Prostate cancer screening. Recommendations will vary depending on your family history and other risks. Hepatitis C blood test. Hepatitis B blood test. Sexually transmitted disease (STD) testing. Diabetes screening. This is done by checking your blood sugar (glucose) after you have not eaten for a while (fasting). You may have this done every 1-3 years. Abdominal aortic aneurysm (AAA) screening. You may need this if you are a current or former smoker. Osteoporosis. You may be screened starting at age 69 if you are at high risk. Talk with your health care provider about your test results, treatment options, and if necessary, the need for more tests. Vaccines  Your health care provider may recommend certain vaccines, such as: Influenza vaccine. This is recommended every year. Tetanus, diphtheria, and acellular pertussis (Tdap, Td) vaccine. You may need a Td booster every 10 years. Zoster  vaccine. You may need this after age 85. Pneumococcal 13-valent conjugate (PCV13) vaccine. One dose is  recommended after age 42. Pneumococcal polysaccharide (PPSV23) vaccine. One dose is recommended after age 26. Talk to your health care provider about which screenings and vaccines you need and how often you need them. This information is not intended to replace advice given to you by your health care provider. Make sure you discuss any questions you have with your health care provider. Document Released: 07/08/2015 Document Revised: 02/29/2016 Document Reviewed: 04/12/2015 Elsevier Interactive Patient Education  2017 [place] Prevention in the Home Falls can cause injuries. They can happen to people of all ages. There are many things you can do to make your home safe and to help prevent falls. What can I do on the outside of my home? Regularly fix the edges of walkways and driveways and fix any cracks. Remove anything that might make you trip as you walk through a door, such as a raised step or threshold. Trim any bushes or trees on the path to your home. Use bright outdoor lighting. Clear any walking paths of anything that might make someone trip, such as rocks or tools. Regularly check to see if handrails are loose or broken. Make sure that both sides of any steps have handrails. Any raised decks and porches should have guardrails on the edges. Have any leaves, snow, or ice cleared regularly. Use sand or salt on walking paths during winter. Clean up any spills in your garage right away. This includes oil or grease spills. What can I do in the bathroom? Use night lights. Install grab bars by the toilet and in the tub and shower. Do not use towel bars as grab bars. Use non-skid mats or decals in the tub or shower. If you need to sit down in the shower, use a plastic, non-slip stool. Keep the floor dry. Clean up any water that spills on the floor as soon as it happens. Remove soap buildup in the tub or shower regularly. Attach bath mats securely with double-sided non-slip rug  tape. Do not have throw rugs and other things on the floor that can make you trip. What can I do in the bedroom? Use night lights. Make sure that you have a light by your bed that is easy to reach. Do not use any sheets or blankets that are too big for your bed. They should not hang down onto the floor. Have a firm chair that has side arms. You can use this for support while you get dressed. Do not have throw rugs and other things on the floor that can make you trip. What can I do in the kitchen? Clean up any spills right away. Avoid walking on wet floors. Keep items that you use a lot in easy-to-reach places. If you need to reach something above you, use a strong step stool that has a grab bar. Keep electrical cords out of the way. Do not use floor polish or wax that makes floors slippery. If you must use wax, use non-skid floor wax. Do not have throw rugs and other things on the floor that can make you trip. What can I do with my stairs? Do not leave any items on the stairs. Make sure that there are handrails on both sides of the stairs and use them. Fix handrails that are broken or loose. Make sure that handrails are as long as the stairways. Check any carpeting to make sure that it  is firmly attached to the stairs. Fix any carpet that is loose or worn. Avoid having throw rugs at the top or bottom of the stairs. If you do have throw rugs, attach them to the floor with carpet tape. Make sure that you have a light switch at the top of the stairs and the bottom of the stairs. If you do not have them, ask someone to add them for you. What else can I do to help prevent falls? Wear shoes that: Do not have high heels. Have rubber bottoms. Are comfortable and fit you well. Are closed at the toe. Do not wear sandals. If you use a stepladder: Make sure that it is fully opened. Do not climb a closed stepladder. Make sure that both sides of the stepladder are locked into place. Ask someone to  hold it for you, if possible. Clearly mark and make sure that you can see: Any grab bars or handrails. First and last steps. Where the edge of each step is. Use tools that help you move around (mobility aids) if they are needed. These include: Canes. Walkers. Scooters. Crutches. Turn on the lights when you go into a dark area. Replace any light bulbs as soon as they burn out. Set up your furniture so you have a clear path. Avoid moving your furniture around. If any of your floors are uneven, fix them. If there are any pets around you, be aware of where they are. Review your medicines with your doctor. Some medicines can make you feel dizzy. This can increase your chance of falling. Ask your doctor what other things that you can do to help prevent falls. This information is not intended to replace advice given to you by your health care provider. Make sure you discuss any questions you have with your health care provider. Document Released: 04/07/2009 Document Revised: 11/17/2015 Document Reviewed: 07/16/2014 Elsevier Interactive Patient Education  2017 [employer].

## 2021-03-13 ENCOUNTER — Other Ambulatory Visit: Payer: Self-pay | Admitting: Family Medicine

## 2021-03-13 DIAGNOSIS — M1990 Unspecified osteoarthritis, unspecified site: Secondary | ICD-10-CM

## 2021-04-03 DIAGNOSIS — Z23 Encounter for immunization: Secondary | ICD-10-CM | POA: Diagnosis not present

## 2021-04-07 ENCOUNTER — Ambulatory Visit (INDEPENDENT_AMBULATORY_CARE_PROVIDER_SITE_OTHER): Payer: Medicare Other | Admitting: Cardiology

## 2021-04-07 ENCOUNTER — Encounter: Payer: Self-pay | Admitting: Cardiology

## 2021-04-07 ENCOUNTER — Other Ambulatory Visit: Payer: Self-pay

## 2021-04-07 VITALS — BP 124/80 | HR 81 | Ht 70.0 in | Wt 175.0 lb

## 2021-04-07 DIAGNOSIS — I483 Typical atrial flutter: Secondary | ICD-10-CM

## 2021-04-07 DIAGNOSIS — I444 Left anterior fascicular block: Secondary | ICD-10-CM

## 2021-04-07 DIAGNOSIS — I4510 Unspecified right bundle-branch block: Secondary | ICD-10-CM

## 2021-04-07 DIAGNOSIS — I5022 Chronic systolic (congestive) heart failure: Secondary | ICD-10-CM

## 2021-04-07 DIAGNOSIS — I440 Atrioventricular block, first degree: Secondary | ICD-10-CM | POA: Diagnosis not present

## 2021-04-07 MED ORDER — ENTRESTO 24-26 MG PO TABS: 1.0000 | ORAL_TABLET | Freq: Two times a day (BID) | ORAL | 11 refills | Status: DC

## 2021-04-07 NOTE — Patient Instructions (Addendum)
Medication Instructions:  Your physician has recommended you make the following change in your medication:    START taking Entresto 24/26-  Take one tablet by mouth twice a day  2.    Schedule follow up with pharmacy for Entresto titration  *If you need a refill on your cardiac medications before your next appointment, please call your pharmacy*  Lab Work: None ordered. If you have labs (blood work) drawn today and your tests are completely normal, you will receive your results only by: [place] (if you have MyChart) OR A paper copy in the mail If you have any lab test that is abnormal or we need to change your treatment, we will call you to review the results.  Testing/Procedures: Please schedule for exercise myoview.  Follow-Up: At [facility], you and your health needs are our priority.  As part of our continuing mission to provide you with exceptional heart care, we have created designated Provider Care Teams.  These Care Teams include your primary Cardiologist (physician) and Advanced Practice Providers (APPs -  Physician Assistants and Nurse Practitioners) who all work together to provide you with the care you need, when you need it.  Your next appointment:    The following dates are available for atrial flutter ablation:  January 6, 12, 17, 19, 20, 26, 31  Cardiac Ablation Cardiac ablation is a procedure to destroy, or ablate, a small amount of heart tissue in very specific places. The heart has many electrical connections. Sometimes these connections are abnormal and can cause the heart to beat very fast or irregularly. Ablating some of the areas that cause problems can improve the heart's rhythm or return it to normal. Ablation may be done for people who: Have Wolff-Parkinson-White syndrome. Have fast heart rhythms (tachycardia). Have taken medicines for an abnormal heart rhythm (arrhythmia) that were not effective or caused side effects. Have a high-risk heartbeat  that may be life-threatening. During the procedure, a small incision is made in the neck or the groin, and a long, thin tube (catheter) is inserted into the incision and moved to the heart. Small devices (electrodes) on the tip of the catheter will send out electrical currents. A type of X-ray (fluoroscopy) will be used to help guide the catheter and to provide images of the heart. Tell a health care provider about: Any allergies you have. All medicines you are taking, including vitamins, herbs, eye drops, creams, and over-the-counter medicines. Any problems you or family members have had with anesthetic medicines. Any blood disorders you have. Any surgeries you have had. Any medical conditions you have, such as kidney failure. Whether you are pregnant or may be pregnant. What are the risks? Generally, this is a safe procedure. However, problems may occur, including: Infection. Bruising and bleeding at the catheter insertion site. Bleeding into the chest, especially into the sac that surrounds the heart. This is a serious complication. Stroke or blood clots. Damage to nearby structures or organs. Allergic reaction to medicines or dyes. Need for a permanent pacemaker if the normal electrical system is damaged. A pacemaker is a small computer that sends electrical signals to the heart and helps your heart beat normally. The procedure not being fully effective. This may not be recognized until months later. Repeat ablation procedures are sometimes done. What happens before the procedure? Medicines Ask your health care provider about: Changing or stopping your regular medicines. This is especially important if you are taking diabetes medicines or blood thinners. Taking medicines such as  aspirin and ibuprofen. These medicines can thin your blood. Do not take these medicines unless your health care provider tells you to take them. Taking over-the-counter medicines, vitamins, herbs, and  supplements. General instructions Follow instructions from your health care provider about eating or drinking restrictions. Plan to have someone take you home from the hospital or clinic. If you will be going home right after the procedure, plan to have someone with you for 24 hours. Ask your health care provider what steps will be taken to prevent infection. What happens during the procedure?  An IV will be inserted into one of your veins. You will be given a medicine to help you relax (sedative). The skin on your neck or groin will be numbed. An incision will be made in your neck or your groin. A needle will be inserted through the incision and into a large vein in your neck or groin. A catheter will be inserted into the needle and moved to your heart. Dye may be injected through the catheter to help your surgeon see the area of the heart that needs treatment. Electrical currents will be sent from the catheter to ablate heart tissue in desired areas. There are three types of energy that may be used to do this: Heat (radiofrequency energy). Laser energy. Extreme cold (cryoablation). When the tissue has been ablated, the catheter will be removed. Pressure will be held on the insertion area to prevent a lot of bleeding. A bandage (dressing) will be placed over the insertion area. The exact procedure may vary among health care providers and hospitals. What happens after the procedure? Your blood pressure, heart rate, breathing rate, and blood oxygen level will be monitored until you leave the hospital or clinic. Your insertion area will be monitored for bleeding. You will need to lie still for a few hours to ensure that you do not bleed from the insertion area. Do not drive for 24 hours or as long as told by your health care provider. Summary Cardiac ablation is a procedure to destroy, or ablate, a small amount of heart tissue using an electrical current. This procedure can improve the  heart rhythm or return it to normal. Tell your health care provider about any medical conditions you may have and all medicines you are taking to treat them. This is a safe procedure, but problems may occur. Problems may include infection, bruising, damage to nearby organs or structures, or allergic reactions to medicines. Follow your health care provider's instructions about eating and drinking before the procedure. You may also be told to change or stop some of your medicines. After the procedure, do not drive for 24 hours or as long as told by your health care provider. This information is not intended to replace advice given to you by your health care provider. Make sure you discuss any questions you have with your health care provider. Document Revised: 04/20/2019 Document Reviewed: 04/20/2019 Elsevier Patient Education  [place].

## 2021-04-07 NOTE — Progress Notes (Signed)
Electrophysiology Office Note:    Date:  04/07/2021   ID:  Joseph Gill, Joseph Gill [birth year], MRN [id number]  PCP:  Billie Ruddy, MD  [facility] Cardiologist:  None  [facility] Electrophysiologist:  None   Referring MD: Oliver Barre, PA   Chief Complaint: Atrial flutter  History of Present Illness:    Joseph Gill is a 70 y.o. male who presents for an evaluation of typical atrial flutter and cardiomyopathy at the request of Adline Peals, PA-C. Their medical history includes hyperlipidemia, migraine headaches.  The patient was seen by Adline Peals on February 23, 2021.  His diagnosis of atrial flutter was made on February 20, 2021 when he presented to his primary care physician with 3 days of chest tightness, palpitations and lightheadedness.  His primary care physician referred him to the emergency department where EKG showed typical atrial flutter.  He was started on metoprolol.  Since his appointment with Audry Pili, he has had intermittent episodes of atrial flutter.  He tells me that his heart rate will jump up and he is symptomatic.  Today when I asked him about if he had had similar symptoms prior to his appointment with his primary care physician, he thinks maybe for the last few months he has experienced salvos of atrial flutter.  He has never been diagnosed with atrial fibrillation.     Past Medical History:  Diagnosis Date   ALLERGIC RHINITIS 03/26/2007   Allergy    Back pain    knee and leg pain   Cataract    Chronically dry eyes, bilateral    Complication of anesthesia    Per pt, hard to wake up past certain sedation!   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   amitriptyline (ELAVIL) 25 MG tablet TAKE 1 TABLET BY MOUTH EVERYDAY AT BEDTIME   apixaban (ELIQUIS) 5 MG TABS tablet Take 1 tablet (5 mg total) by mouth 2 (two) times daily.   atorvastatin (LIPITOR) 40 MG tablet Take 1 tablet (40 mg total) by mouth daily.   loratadine (CLARITIN) 10 MG tablet Take 10 mg by mouth as needed.    metoprolol succinate (TOPROL XL) 25 MG 24 hr tablet Take 1 tablet (25 mg total) by mouth daily.   mometasone (NASONEX) 50 MCG/ACT nasal spray PLACE 2 SPRAYS INTO THE NOSE  DAILY.   Multiple Vitamin (MULTIVITAMIN) tablet Take 1 tablet by mouth daily.   pseudoephedrine (SUDAFED) 30 MG tablet Take 30 mg by mouth as needed for congestion.   sacubitril-valsartan (ENTRESTO) 24-26 MG Take 1 tablet by mouth 2 (two) times daily.   SUMAtriptan (IMITREX) 100 MG tablet May repeat in 2 hours if headache persists or recurs.   Testosterone 20.25 MG/ACT (1.62%) GEL USE 2 PUMPS ON THE SKIN EVERY MORNING AS DIRECTED   traMADol (ULTRAM) 50 MG tablet TAKE 1 TABLET EVERY 12 HOURS AS NEEDED FOR ARTHRITIS PAIN.     Allergies:   Erythromycin   Social History   Socioeconomic History   Marital status: Married    Spouse  name: Not on file   Number of children: Not on file   Years of education: Not on file   Highest education level: Not on file  Occupational History   Not on file  Tobacco Use   Smoking status: Never   Smokeless tobacco: Never  Substance and Sexual Activity   Alcohol use: Yes    Alcohol/week: 4.0 standard drinks    Types: 1 Glasses of wine, 1 Cans of beer, 1 Shots of liquor, 1 Standard drinks or equivalent per week    Comment: occ   Drug use: No   Sexual activity: Not on file  Other Topics Concern   Not on file  Social History Narrative   Not on file   Social Determinants of Health   Financial Resource Strain: Low Risk    Difficulty of Paying Living Expenses: Not hard at  all  Food Insecurity: No Food Insecurity   Worried About Charity fundraiser in the Last Year: Never true   Ran Out of Food in the Last Year: Never true  Transportation Needs: No Transportation Needs   Lack of Transportation (Medical): No   Lack of Transportation (Non-Medical): No  Physical Activity: Inactive   Days of Exercise per Week: 0 days   Minutes of Exercise per Session: 0 min  Stress: No Stress Concern Present   Feeling of Stress : Not at all  Social Connections: Socially Isolated   Frequency of Communication with Friends and Family: Once a week   Frequency of Social Gatherings with Friends and Family: Once a week   Attends Religious Services: Never   Marine scientist or Organizations: No   Attends Music therapist: Never   Marital Status: Married     Family History: The patient's family history includes Heart disease in his father, maternal grandfather, and mother; Lung cancer in his maternal uncle; Prostate cancer in his paternal grandfather.  ROS:   Please see the history of present illness.    All other systems reviewed and are negative.  EKGs/Labs/Other Studies Reviewed:    The following studies were reviewed today:  February 23, 2021 echo personally reviewed Left ventricular function moderately decreased, 30% Right ventricular function moderately decreased Mild MR   EKG:  The ekg ordered today demonstrates sinus rhythm, first-degree AV delay, right bundle branch block and left anterior fascicular block   Recent Labs: 02/20/2021: ALT 24; BUN 17; Creatinine, Ser 0.80; Hemoglobin 16.0; Magnesium 2.0; Platelets 270; Potassium 3.8; Sodium 141  Recent Lipid Panel    Component Value Date/Time   CHOL 161 02/26/2020 0904   TRIG 146 02/26/2020 0904   HDL 53 02/26/2020 0904   CHOLHDL 3.0 02/26/2020 0904   VLDL 64.6 (H) 04/02/2019 1409   LDLCALC 83 02/26/2020 0904   LDLDIRECT 82.0 04/02/2019 1409    Physical Exam:    VS:  BP 124/80    Pulse 81   Ht 5\' 10"  (1.778 m)   Wt 175 lb (79.4 kg)   BMI 25.11 kg/m     Wt Readings from Last 3 Encounters:  04/07/21 175 lb (79.4 kg)  03/07/21 172 lb 14.4 oz (78.4 kg)  02/23/21 172 lb 12.8 oz (78.4 kg)     GEN:  Well nourished, well developed in no acute distress HEENT: Normal NECK: No JVD; No carotid bruits LYMPHATICS: No lymphadenopathy CARDIAC: RRR, no murmurs, rubs, gallops RESPIRATORY:  Clear to auscultation without rales, wheezing or rhonchi  ABDOMEN: Soft, non-tender, non-distended MUSCULOSKELETAL:  No edema; No  deformity  SKIN: Warm and dry NEUROLOGIC:  Alert and oriented x 3 PSYCHIATRIC:  Normal affect       ASSESSMENT:    1. Chronic systolic heart failure ([place])   2. Typical atrial flutter ([place])   3. RBBB   4. LAFB (left anterior fascicular block)   5. First degree AV block    PLAN:    In order of problems listed above:  #Chronic systolic heart failure Relatively new diagnosis.  Has not had an ischemic work-up.  Is also possible that he has had a tachycardia mediated cardiomyopathy.  Interestingly he has significant conduction disease on his baseline EKG that could be connected.  He is on Toprol-XL but will need to be initiated on guideline directed medical therapy for his cardiomyopathy.  I will plan to start Entresto today and have him follow-up with the pharmacy team for further titration and monitoring.  Rhythm control be important as we address his new systolic heart failure.  I will also get an exercise nuclear stress test to further investigate the possibility of coronary artery disease.  #Typical atrial flutter Rhythm control is indicated.  I discussed antiarrhythmic drugs and ablation for his typical atrial flutter and he wishes to proceed with ablation.  I discussed the procedure in detail including the risks, recovery and efficacy.  He is on Eliquis 5 mg by mouth twice daily for stroke prophylaxis.  He will need to continue on this  regimen.  We discussed the possibility of him having atrial fibrillation after a successful atrial flutter ablation.  We discussed the need for monitoring for atrial fibrillation after his flutter ablation.  We discussed using a loop recorder for this surveillance and he is interested.  We will plan to implant this 3 months after the ablation procedure.  #Conduction disease After flutter ablation and started on guideline directed medical therapy, would plan to perform a cardiac MRI.       Total time spent with patient today 65 minutes. This includes reviewing records, evaluating the patient and coordinating care.  Medication Adjustments/Labs and Tests Ordered: Current medicines are reviewed at length with the patient today.  Concerns regarding medicines are outlined above.  Orders Placed This Encounter  Procedures   AMB Referral to [facility] Pharm-D   Myocardial Perfusion Imaging   Meds ordered this encounter  Medications   sacubitril-valsartan (ENTRESTO) 24-26 MG    Sig: Take 1 tablet by mouth 2 (two) times daily.    Dispense:  60 tablet    Refill:  11     Signed, Walda Hertzog T. Quentin Ore, MD, [facility], [facility] 04/07/2021 9:16 PM    Electrophysiology [facility]

## 2021-04-07 NOTE — Progress Notes (Signed)
art 

## 2021-04-10 ENCOUNTER — Institutional Professional Consult (permissible substitution): Payer: Medicare Other | Admitting: Cardiology

## 2021-04-10 ENCOUNTER — Telehealth (HOSPITAL_COMMUNITY): Payer: Self-pay | Admitting: *Deleted

## 2021-04-10 NOTE — Telephone Encounter (Signed)
Instruction letter sent via [place] outlining instructions for stress test on 04/14/21.

## 2021-04-11 ENCOUNTER — Telehealth: Payer: Self-pay

## 2021-04-11 ENCOUNTER — Other Ambulatory Visit: Payer: Self-pay | Admitting: Cardiology

## 2021-04-11 NOTE — Telephone Encounter (Signed)
**Note De-Identified Joseph Gill Obfuscation** Entresto PA started through covermymeds. Key: [id number]

## 2021-04-13 NOTE — Telephone Encounter (Signed)
**Note De-Identified Joseph Gill Obfuscation** Entresto PA denial letter received from [name]. Reason for denial: Office did not include clinical notes or test results that support the request for [name] coverage.  I called [facility] at [phone] and re-submitted the [name] PA verbally with [name] who states that she has forwarded the question and answer part of this Entresto PA to the PA dept and that we need to fax them the pts Echo results and any clinical notes that support this request.  I have emailed the pts most recent Echo results and office visit notes from 04/07/2021 to our TTL nurse so she can fax them to [name] at the fax number written on the cover letter included or to place in Medical Records to be faxed.

## 2021-04-13 NOTE — Telephone Encounter (Signed)
Forms printed off and placed in nurse fax box in medical records, to be faxed to contact information on cover sheet.  Will make prior auth nurse aware this was done.

## 2021-04-14 ENCOUNTER — Other Ambulatory Visit: Payer: Self-pay

## 2021-04-14 ENCOUNTER — Ambulatory Visit (HOSPITAL_COMMUNITY): Payer: Medicare Other | Attending: Cardiovascular Disease

## 2021-04-14 ENCOUNTER — Encounter (HOSPITAL_COMMUNITY): Payer: Medicare Other

## 2021-04-14 DIAGNOSIS — I5022 Chronic systolic (congestive) heart failure: Secondary | ICD-10-CM | POA: Diagnosis not present

## 2021-04-14 LAB — MYOCARDIAL PERFUSION IMAGING
LV dias vol: 95 mL (ref 62–150)
LV sys vol: 56 mL
Nuc Stress EF: 43 %
Peak HR: 134 {beats}/min
Rest HR: 98 {beats}/min
Rest Nuclear Isotope Dose: 10.1 mCi
SDS: 0
SRS: 0
SSS: 0
ST Depression (mm): 0 mm
Stress Nuclear Isotope Dose: 32.7 mCi
TID: 1.12

## 2021-04-14 MED ORDER — TECHNETIUM TC 99M TETROFOSMIN IV KIT
10.1000 | PACK | Freq: Once | INTRAVENOUS | Status: AC | PRN
  Administered 2021-04-14: 10.1 via INTRAVENOUS
  Filled 2021-04-14: qty 11

## 2021-04-14 MED ORDER — TECHNETIUM TC 99M TETROFOSMIN IV KIT
32.7000 | PACK | Freq: Once | INTRAVENOUS | Status: AC | PRN
  Administered 2021-04-14: 32.7 via INTRAVENOUS
  Filled 2021-04-14: qty 33

## 2021-04-14 MED ORDER — REGADENOSON 0.4 MG/5ML IV SOLN
0.4000 mg | Freq: Once | INTRAVENOUS | Status: AC
  Administered 2021-04-14: 0.4 mg via INTRAVENOUS

## 2021-04-17 ENCOUNTER — Encounter (HOSPITAL_BASED_OUTPATIENT_CLINIC_OR_DEPARTMENT_OTHER): Payer: Medicare Other | Admitting: Cardiovascular Disease

## 2021-04-17 NOTE — Telephone Encounter (Signed)
**Note De-Identified [name] Obfuscation** Letter received Joseph Gill fax from [place] stating that they have approved coverage of the pts Eliquis from 04/14/2021- 04/14/2024.  I have notified [name] [name] of this approval.

## 2021-04-18 ENCOUNTER — Telehealth: Payer: Self-pay

## 2021-04-18 DIAGNOSIS — I483 Typical atrial flutter: Secondary | ICD-10-CM

## 2021-04-18 NOTE — Telephone Encounter (Signed)
Pt is scheduled for aflutter ablation January 6.  Pt may not be able to arrive by 5:30 am.  Will need to follow to determine arrival time.  Work up complete-just need to confirm arrival time.

## 2021-04-19 ENCOUNTER — Other Ambulatory Visit: Payer: Self-pay | Admitting: Family Medicine

## 2021-04-19 DIAGNOSIS — E349 Endocrine disorder, unspecified: Secondary | ICD-10-CM

## 2021-04-19 NOTE — Telephone Encounter (Signed)
Pt scheduled to arrive at 8:30 am for procedure.   Will mail letter. Work up complete.

## 2021-04-20 ENCOUNTER — Ambulatory Visit (INDEPENDENT_AMBULATORY_CARE_PROVIDER_SITE_OTHER): Payer: Medicare Other | Admitting: Pharmacist

## 2021-04-20 ENCOUNTER — Other Ambulatory Visit: Payer: Self-pay

## 2021-04-20 VITALS — BP 104/64 | HR 98 | Wt 176.0 lb

## 2021-04-20 DIAGNOSIS — I5022 Chronic systolic (congestive) heart failure: Secondary | ICD-10-CM

## 2021-04-20 NOTE — Progress Notes (Signed)
Patient ID: [name]                 DOB: [birth year]                      MRN: [id number]     HPI: Joseph Gill is a 70 y.o. male referred by Dr. Quentin Gill to pharmacy clinic for HF medication management. PMH is significant for atrial flutter dx 01/2021 scheduled for ablation 06/2021, HLD, and new diagnosis of CHF on 02/2021 echo with EF 30-35% and mild LVH. He was changed from Lopressor to Toprol after echo results. Delene Loll was added at 10/14 appt with Dr Joseph Gill who referred pt to PharmD for continued CHF med optimization. Nuclear stress test 10/21 was low risk with no evidence of ischemia, EF 30-44%.  Pt presents today for follow up. Has been feeling a bit tired and dizzy when standing since starting Entresto. Also feeling thirstier which he has attributed to his Eliquis. Has noticed an improvement in migraines since starting new meds. Has not been set up with copay cards for his Entresto or Eliquis. Started Entresto last Saturday, the pharmacy didn't let him know when his rx was ready for pick up, has been taking it for the past 5 days. Confirmed Entresto available on his formulary without prior auth.   Current CHF meds: Entresto 24-26mg  BID, Toprol 25mg  daily  BP goal: <130/58mmHg  Family History: Mother, father, and maternal grandfather with heart disease.  Social History: Denies tobacco and drug use, occasional alcohol use  Diet: caffeinated tea 3x a day. Does salt food, likes soy sauce  Exercise: not as much as he used to  Home BP readings: does not have cuff at home to monitor  Wt Readings from Last 3 Encounters:  04/14/21 175 lb (79.4 kg)  04/07/21 175 lb (79.4 kg)  03/07/21 172 lb 14.4 oz (78.4 kg)   BP Readings from Last 3 Encounters:  04/07/21 124/80  03/07/21 100/64  02/23/21 114/80   Pulse Readings from Last 3 Encounters:  04/07/21 81  03/07/21 83  02/23/21 71    Renal function: CrCl cannot be calculated (Patient's most recent lab result is older than  the maximum 21 days allowed.).  Past Medical History:  Diagnosis Date   ALLERGIC RHINITIS 03/26/2007   Allergy    Back pain    knee and leg pain   Cataract    Chronically dry eyes, bilateral    Complication of anesthesia    Per pt, hard to wake up past certain sedation!   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     amitriptyline (ELAVIL) 25 MG tablet TAKE 1 TABLET BY MOUTH EVERYDAY AT BEDTIME 90 tablet 1   apixaban (ELIQUIS) 5 MG TABS tablet Take 1 tablet (5 mg total) by mouth 2 (two) times daily. 60 tablet 3   atorvastatin (LIPITOR) 40 MG tablet Take 1 tablet (40 mg total) by mouth daily. 90 tablet 3   ENTRESTO 24-26 MG TAKE 1 TABLET BY MOUTH TWICE A DAY 60 tablet 11   loratadine (CLARITIN) 10 MG tablet Take 10 mg by mouth as needed.      metoprolol succinate (TOPROL XL) 25 MG 24 hr  tablet Take 1 tablet (25 mg total) by mouth daily. 30 tablet 3   mometasone (NASONEX) 50 MCG/ACT nasal spray PLACE 2 SPRAYS INTO THE NOSE  DAILY. 17 g 2   Multiple Vitamin (MULTIVITAMIN) tablet Take 1 tablet by mouth daily.     pseudoephedrine (SUDAFED) 30 MG tablet Take 30 mg by mouth as needed for congestion.     SUMAtriptan (IMITREX) 100 MG tablet May repeat in 2 hours if headache persists or recurs. 10 tablet 11   Testosterone 20.25 MG/ACT (1.62%) GEL USE 2 PUMPS ON THE SKIN EVERY MORNING AS DIRECTED 75 g 0   traMADol (ULTRAM) 50 MG tablet TAKE 1 TABLET EVERY 12 HOURS AS NEEDED FOR ARTHRITIS PAIN. 30 tablet 2   No current facility-administered medications on file prior to visit.    Allergies  Allergen Reactions   Erythromycin     Feels terrible!     Assessment/Plan:  1. CHF - BP soft today at 104/64, he has been noticing occasional dizziness  when standing. Has been on Entresto 24-26mg  BID for the past 5 days, also takes Toprol 25mg  daily, HR elevated at 98 today, scheduled for ablation in January. Will check BMET today with recent Entresto start. Lower BP currently preventing titration of CHF meds. Pt agreeable to purchase BP cuff and start monitoring his readings at home. I'll call him in 2 weeks to see if he's tolerating Entresto better at that time and if BP will allow for dose titration of Toprol or addition of SGLT2i, both of which would have less effect on BP than titrating his Entresto or adding spironolactone. I also activated copay cards for pt's Entresto and Eliquis today during his visit.  Jenipher Havel E. Moriah Shawley, PharmD, BCACP, [place] [address] [place], [place] [address] Phone: [phone]; Fax: [phone] 04/20/2021 4:20 PM

## 2021-04-20 NOTE — Patient Instructions (Signed)
It was nice to see you today  Your blood pressure is at goal < 130/70mmHg  Continue to take your current medications  Start monitoring your blood pressure at home, it's been running a bit on the lower side. Ideally we would like to increase the dose of your metoprolol or add on either Farxiga or Jardiance to help improve your ejection fraction  I'll give you a call in 2 weeks to check in on your home blood pressure readings

## 2021-04-21 ENCOUNTER — Telehealth: Payer: Self-pay | Admitting: Pharmacist

## 2021-04-21 DIAGNOSIS — I5022 Chronic systolic (congestive) heart failure: Secondary | ICD-10-CM

## 2021-04-21 LAB — BASIC METABOLIC PANEL
BUN/Creatinine Ratio: 16 (ref 10–24)
BUN: 20 mg/dL (ref 8–27)
CO2: 23 mmol/L (ref 20–29)
Calcium: 9.4 mg/dL (ref 8.6–10.2)
Chloride: 101 mmol/L (ref 96–106)
Creatinine, Ser: 1.25 mg/dL (ref 0.76–1.27)
Glucose: 102 mg/dL — ABNORMAL HIGH (ref 70–99)
Potassium: 4.3 mmol/L (ref 3.5–5.2)
Sodium: 141 mmol/L (ref 134–144)
eGFR: 62 mL/min/{1.73_m2} (ref >59)

## 2021-04-21 NOTE — Telephone Encounter (Signed)
Scr with a >30% bump after starting Entresto. Patient also reports fatigue/dizziness with low BP. Will stop Entresto. Increase metoprolol to 25mg  BID (50mg  total- pt prefers to take BID) due to elevated HR. Repeat BMP in 1 week on 11/4 Megan Supple will call in 2 weeks to f/u on BP.

## 2021-04-24 MED ORDER — METOPROLOL SUCCINATE ER 25 MG PO TB24: 25.0000 mg | ORAL_TABLET | Freq: Two times a day (BID) | ORAL | 3 refills | Status: DC

## 2021-04-24 NOTE — Telephone Encounter (Signed)
I have removed Entresto from pt's med list and sent in refill on higher dose of Toprol.

## 2021-04-24 NOTE — Addendum Note (Signed)
Addended by: Lasaro Primm E on: 04/24/2021 07:43 AM   Modules accepted: Orders

## 2021-04-27 ENCOUNTER — Other Ambulatory Visit: Payer: Self-pay | Admitting: Family Medicine

## 2021-04-27 DIAGNOSIS — E785 Hyperlipidemia, unspecified: Secondary | ICD-10-CM

## 2021-04-28 ENCOUNTER — Other Ambulatory Visit: Payer: Self-pay

## 2021-04-28 ENCOUNTER — Other Ambulatory Visit: Payer: Medicare Other | Admitting: *Deleted

## 2021-04-28 DIAGNOSIS — I5022 Chronic systolic (congestive) heart failure: Secondary | ICD-10-CM | POA: Diagnosis not present

## 2021-04-28 LAB — BASIC METABOLIC PANEL
BUN/Creatinine Ratio: 14 (ref 10–24)
BUN: 13 mg/dL (ref 8–27)
CO2: 24 mmol/L (ref 20–29)
Calcium: 9.6 mg/dL (ref 8.6–10.2)
Chloride: 100 mmol/L (ref 96–106)
Creatinine, Ser: 0.94 mg/dL (ref 0.76–1.27)
Glucose: 89 mg/dL (ref 70–99)
Potassium: 3.9 mmol/L (ref 3.5–5.2)
Sodium: 137 mmol/L (ref 134–144)
eGFR: 88 mL/min/{1.73_m2} (ref >59)

## 2021-05-01 ENCOUNTER — Telehealth: Payer: Self-pay | Admitting: Pharmacist

## 2021-05-01 MED ORDER — EMPAGLIFLOZIN 10 MG PO TABS: 10.0000 mg | ORAL_TABLET | Freq: Every day | ORAL | 5 refills | Status: DC

## 2021-05-01 MED ORDER — DAPAGLIFLOZIN PROPANEDIOL 10 MG PO TABS: 10.0000 mg | ORAL_TABLET | Freq: Every day | ORAL | 5 refills | Status: DC

## 2021-05-01 NOTE — Telephone Encounter (Signed)
BMET stable, SCr improved back to baseline after stopping Entresto. Pt reports feeling better off of Entresto, BP has increased a bit to 99/65 (2 days after stopping Entresto), now improved to the 130s/80s over the past 3 days. HR has ranged 58 - 78. Will add SGLT2i for CHF and call pt in 2 weeks for tolerability update. Rx sent for Farxiga 10mg  daily to see if covered by insurance.  Pharmacy stated copay was $75, already has a coupon on file taking $125 off. Will send rx for Jardiance to see if it's cheaper.   Jardiance copay $200, pharmacy would not take copay card info over the phone since they are short staffed. I have faxed this and will have to call back later to confirm copay again.    Called pharmacy again, Jardiance copay $25/1 month. Called pt who is ok with this. He is aware to start and will continue to monitor his BP at home. I'll call him in ~3 weeks to follow up with tolerability and continued CHF med optimization.

## 2021-05-08 ENCOUNTER — Other Ambulatory Visit (HOSPITAL_COMMUNITY): Payer: Self-pay | Admitting: Physician Assistant

## 2021-05-23 ENCOUNTER — Telehealth: Payer: Self-pay | Admitting: Pharmacist

## 2021-05-23 DIAGNOSIS — I5022 Chronic systolic (congestive) heart failure: Secondary | ICD-10-CM

## 2021-05-23 MED ORDER — LOSARTAN POTASSIUM 25 MG PO TABS: 25.0000 mg | ORAL_TABLET | Freq: Every day | ORAL | 3 refills | Status: DC

## 2021-05-23 NOTE — Telephone Encounter (Signed)
Called pt, reports flatulence that started after beginning [place]. Advised pt this is not a common side effect of the med. He is agreeable to continuing. Home BP 136/87, 138/84 on the higher end (usually in the afternoon), 105/75 on the lower end (usually lower in the mornings), usually 112-114/70s. HR 70s, upper 60s as the lowest.  Will start losartan 25mg  daily. Pt aware to monitor BP and if SBP < 105 on a regular basis, may need to decrease to 12.5mg  daily or d/c. Will call pt in 1 week to follow up with tolerability. Will need BMET scheduled if his BP tolerates addition of ARB.

## 2021-05-24 ENCOUNTER — Other Ambulatory Visit (HOSPITAL_COMMUNITY): Payer: Self-pay | Admitting: Physician Assistant

## 2021-05-25 MED ORDER — METOPROLOL SUCCINATE ER 25 MG PO TB24: 25.0000 mg | ORAL_TABLET | Freq: Two times a day (BID) | ORAL | 1 refills | Status: DC

## 2021-05-25 NOTE — Addendum Note (Signed)
Addended by: Maalik Pinn E on: 05/25/2021 01:15 PM   Modules accepted: Orders

## 2021-05-25 NOTE — Telephone Encounter (Signed)
Pt called clinic asking how he shoud be taking his Toprol. Went to pick up refill today and noticed it was for Toprol 25mg  daily. He was previously prescribed Toprol 25mg  BID (prefers to take it BID rather than 50mg  once daily). Refill was sent in incorrectly yesterday and unfortunately pt already picked it up with incorrect quantity and directions. I have sent in new refill to his pharmacy.

## 2021-05-30 NOTE — Telephone Encounter (Signed)
Called pt to follow up with BP readings since starting losartan 1 week ago for his CHF. Reports tolerating well. Home BP readings over the past week as high as 147, low of 117. HR 55-70s. Occasional dizziness if stands too quickly. He will come in for BMET tomorrow.

## 2021-05-30 NOTE — Addendum Note (Signed)
Addended by: Ahlana Slaydon E on: 05/30/2021 04:14 PM   Modules accepted: Orders

## 2021-05-31 ENCOUNTER — Other Ambulatory Visit: Payer: Self-pay

## 2021-05-31 ENCOUNTER — Other Ambulatory Visit: Payer: Medicare Other | Admitting: *Deleted

## 2021-05-31 DIAGNOSIS — I5022 Chronic systolic (congestive) heart failure: Secondary | ICD-10-CM

## 2021-06-01 ENCOUNTER — Telehealth: Payer: Self-pay | Admitting: Pharmacist

## 2021-06-01 LAB — BASIC METABOLIC PANEL
BUN/Creatinine Ratio: 18 (ref 10–24)
BUN: 18 mg/dL (ref 8–27)
CO2: 23 mmol/L (ref 20–29)
Calcium: 10 mg/dL (ref 8.6–10.2)
Chloride: 101 mmol/L (ref 96–106)
Creatinine, Ser: 1.02 mg/dL (ref 0.76–1.27)
Glucose: 94 mg/dL (ref 70–99)
Potassium: 4.1 mmol/L (ref 3.5–5.2)
Sodium: 141 mmol/L (ref 134–144)
eGFR: 80 mL/min/{1.73_m2} (ref >59)

## 2021-06-01 NOTE — Telephone Encounter (Addendum)
BMET stable after starting losartan for CHF 1 week ago. Did not tolerate Entresto secondary to hypotension and rise in SCr from 0.8 to 1.25. He is also taking Jardiance 10mg  daily and Toprol 25mg  BID (he prefers to take this BID). Will continue current meds for now as pt has been tolerating them well. Encouraged him to continue monitoring his BP and record readings. I'll call him in another few weeks. Hopefully will be able to add spironolactone for CHF benefit if BP allows.

## 2021-06-08 ENCOUNTER — Other Ambulatory Visit (HOSPITAL_COMMUNITY): Payer: Self-pay | Admitting: Physician Assistant

## 2021-06-08 NOTE — Telephone Encounter (Signed)
Pt last saw Dr Quentin Ore 04/07/21, last labs 05/31/21 Creat 1.02, age 70, weight 79.8kg, based on specified criteria pt is on appropriate dosage of Eliquis 5mg  BID for a flutter.  Will refill rx.

## 2021-06-16 MED ORDER — SPIRONOLACTONE 25 MG PO TABS: 25.0000 mg | ORAL_TABLET | Freq: Every day | ORAL | 5 refills | Status: DC

## 2021-06-16 NOTE — Addendum Note (Signed)
Addended by: Ezme Duch E on: 06/16/2021 01:01 PM   Modules accepted: Orders

## 2021-06-16 NOTE — Telephone Encounter (Signed)
Called pt to follow up with home BP readings. He reports they've been high - 140-150s. Will add spironolactone 25mg  daily for CHF benefit and HTN. Will recheck BP in office on 1/3, already has pre-procedure labs scheduled that day for his upcoming ablation.

## 2021-06-27 ENCOUNTER — Other Ambulatory Visit: Payer: Medicare Other

## 2021-06-27 ENCOUNTER — Telehealth: Payer: Self-pay | Admitting: Cardiology

## 2021-06-27 ENCOUNTER — Ambulatory Visit: Payer: Medicare Other

## 2021-06-27 NOTE — Telephone Encounter (Signed)
Patient tested last night (06/26/21) for covid, test was negative. His wife is positive currently. He is having coughing, fever, and body aches. Will cancel upcoming procedure and check with timeframe of rescheduling per MD. Patient is going to pick up more tests to retest later this week.

## 2021-06-27 NOTE — Telephone Encounter (Signed)
Patient states he had a fever the night of 06/25/21 and his wife tested positive for COVID. He would like to reschedule 06/30/21 aflutter ablation. Please assist

## 2021-06-28 ENCOUNTER — Telehealth (INDEPENDENT_AMBULATORY_CARE_PROVIDER_SITE_OTHER): Payer: Medicare Other | Admitting: Family Medicine

## 2021-06-28 ENCOUNTER — Encounter: Payer: Self-pay | Admitting: Family Medicine

## 2021-06-28 ENCOUNTER — Telehealth: Payer: Self-pay | Admitting: Family Medicine

## 2021-06-28 DIAGNOSIS — U071 COVID-19: Secondary | ICD-10-CM

## 2021-06-28 MED ORDER — MOLNUPIRAVIR EUA 200MG CAPSULE: 4.0000 | ORAL_CAPSULE | Freq: Two times a day (BID) | ORAL | 0 refills | Status: AC

## 2021-06-28 NOTE — Telephone Encounter (Signed)
Per MD okay to offer ablation in March

## 2021-06-28 NOTE — Telephone Encounter (Signed)
Pt is calling and take covid test he is positive and would like antiviral medication and cough medication. Pt also has a cough. Pt wife had a virtual with dr banks today .  [facility] [place] [place] Phone:  [phone]  Fax:  [phone]

## 2021-06-28 NOTE — Progress Notes (Signed)
Virtual Visit via Telephone Note  I connected with Joseph Gill on 06/28/21 at  5:00 PM EST by telephone and verified that I am speaking with the correct person using two identifiers.   I discussed the limitations, risks, security and privacy concerns of performing an evaluation and management service by telephone and the availability of in person appointments. I also discussed with the patient that there may be a patient responsible charge related to this service. The patient expressed understanding and agreed to proceed.  Location patient: home Location provider: work or home office Participants present for the call: patient, provider Patient did not have a visit in the prior 7 days to address this/these issue(s).  Chief Complaint  Patient presents with   Covid Exposure    Wife tested postitve for covid, has now tested and result is positive.     History of Present Illness: Pt is a 71 yo male with pmh sig for chronic systolic heart failure, typical a flutter, allergies, HLD who was seen for acute concern.  Pt with fever, aches, dry cough, stuffy nose, rhinorrhea, since Sunday evening 06/25/20.   Pt had a negative COVID test on Monday.  Pt's wife tested positive for COVID over the wknd.  Pt's Cardiac ablation for h/o aflutter on 06/30/21 cancelled 2/2 current illness.  Pt advised to test today for COVID.  Results positive.   Observations/Objective: Patient's voice sounds weak on the phone. I do not appreciate any SOB. Intermittent cough Speech and thought processing are grossly intact. Patient reported vitals:  Assessment and Plan: COVID-19 virus infection  -Positive at home COVID test today 06/28/20 with symptoms starting 06/25/20 -supportive care -discussed r/b/a of antiviral meds.  Pt wishes to start molnupirivir. -given strict precautions - Plan: molnupiravir EUA (LAGEVRIO) 200 mg CAPS capsule   Follow Up Instructions:  F/u prn   [id number] 5-10 [id number] 11-20 9443 21-30 I did  not refer this patient for an OV in the next 24 hours for this/these issue(s).  I discussed the assessment and treatment plan with the patient. The patient was provided an opportunity to ask questions and all were answered. The patient agreed with the plan and demonstrated an understanding of the instructions.   The patient was advised to call back or seek an in-person evaluation if the symptoms worsen or if the condition fails to improve as anticipated.  I provided 5 minutes of non-face-to-face time during this encounter.   Billie Ruddy, MD

## 2021-06-30 ENCOUNTER — Encounter (HOSPITAL_COMMUNITY): Admission: RE | Payer: Self-pay | Source: Home / Self Care

## 2021-06-30 ENCOUNTER — Ambulatory Visit (HOSPITAL_COMMUNITY): Admission: RE | Admit: 2021-06-30 | Payer: Medicare Other | Source: Home / Self Care | Admitting: Cardiology

## 2021-06-30 SURGERY — A-FLUTTER ABLATION
Anesthesia: General

## 2021-07-11 NOTE — Progress Notes (Signed)
Patient ID: Joseph Gill                 DOB: 1951-04-06                      MRN: [id number]     HPI: INOCENTE KRACH is a 71 y.o. male referred by Dr. Quentin Ore to pharmacy clinic for HF medication management. PMH is significant for atrial flutter dx 01/2021 scheduled for ablation 06/2021, HLD, and new diagnosis of CHF on 02/2021 echo with EF 30-35% and mild LVH. He was changed from Lopressor to Toprol after echo results. Delene Loll was added at 10/14 appt with Dr Quentin Ore who referred pt to PharmD for continued CHF med optimization. Nuclear stress test 10/21 was low risk with no evidence of ischemia, EF 30-44%.   Scheduled for ablation in January. At PharmD visit on 10/27, pt presented with soft BP (104/64), elevated HR and some dizziness since starting Entresto. Did not titrate any HF meds due to low BP. Activated copay cards for pt's Entresto and Eliquis and encouraged pt to purchase BP cuff to monitor home BP. BMET on 10/27 revealed a SCr bump >30% since starting Entresto, so Entresto was stopped and metoprolol was increased to 25 mg BID. At phone f/u on 11/7, pt reported feeling better off Entresto, and BP had increased since stopping. Farxiga 10 mg daily was added and copay card was set up at pharmacy for $25 per month. Pt called on 11/29 and reported flatulence with Jardiance, likely unrelated to med. Started losartan 25 mg daily and continued Jardiance 10 mg daily. On 12/23, pt reported high SBP in 140-150s, and spironolactone 25 mg daily was added. His ablation was rescheduled to March due to COVID infection.  Today, pt presents to clinic after recently recovering from COVID infection. He is doing well on spironolactone 25 mg daily and has been adherent to all his other medications. He reports having some slight numbness/cold in extremities and redness in his face. He also states he infrequently feels lightheaded, but has not found that it correlates with lower home BP readings. Pt states his home  blood pressures average around 110-117/67-73, with HR in low 60-70s (high of 77). Denies swelling, SOB, HA, dizziness. He does not check his weight at home or exercise.    Current CHF meds: losartan 25mg  daily, Toprol 50mg  BID, spironolactone 25mg  daily, losartan 25mg  daily, Jardiance 10mg  daily  BP goal: <130/67mmHg  Family History: Mother, father, and maternal grandfather with heart disease.  Social History: Denies tobacco and drug use, occasional alcohol use  Diet: caffeinated tea 3x a day. Does salt food, likes soy sauce  Exercise: not as much as he used to  Wt Readings from Last 3 Encounters:  04/14/21 175 lb (79.4 kg)  04/07/21 175 lb (79.4 kg)  03/07/21 172 lb 14.4 oz (78.4 kg)   BP Readings from Last 3 Encounters:  04/07/21 124/80  03/07/21 100/64  02/23/21 114/80   Pulse Readings from Last 3 Encounters:  04/07/21 81  03/07/21 83  02/23/21 71    Renal function: CrCl cannot be calculated (Patient's most recent lab result is older than the maximum 21 days allowed.).  Past Medical History:  Diagnosis Date   ALLERGIC RHINITIS 03/26/2007   Allergy    Back pain    knee and leg pain   Cataract    Chronically dry eyes, bilateral    Complication of anesthesia    Per pt, hard to wake  up past certain sedation!   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     amitriptyline (ELAVIL) 25 MG tablet TAKE 1 TABLET BY MOUTH EVERYDAY AT BEDTIME 90 tablet 1   apixaban (ELIQUIS) 5 MG TABS tablet Take 1 tablet (5 mg total) by mouth 2 (two) times daily. 60 tablet 3   atorvastatin (LIPITOR) 40 MG tablet Take 1 tablet (40 mg total) by mouth daily. 90 tablet 3   ENTRESTO 24-26 MG TAKE 1 TABLET BY MOUTH TWICE A DAY 60 tablet 11    loratadine (CLARITIN) 10 MG tablet Take 10 mg by mouth as needed.      metoprolol succinate (TOPROL XL) 25 MG 24 hr tablet Take 1 tablet (25 mg total) by mouth daily. 30 tablet 3   mometasone (NASONEX) 50 MCG/ACT nasal spray PLACE 2 SPRAYS INTO THE NOSE  DAILY. 17 g 2   Multiple Vitamin (MULTIVITAMIN) tablet Take 1 tablet by mouth daily.     pseudoephedrine (SUDAFED) 30 MG tablet Take 30 mg by mouth as needed for congestion.     SUMAtriptan (IMITREX) 100 MG tablet May repeat in 2 hours if headache persists or recurs. 10 tablet 11   Testosterone 20.25 MG/ACT (1.62%) GEL USE 2 PUMPS ON THE SKIN EVERY MORNING AS DIRECTED 75 g 0   traMADol (ULTRAM) 50 MG tablet TAKE 1 TABLET EVERY 12 HOURS AS NEEDED FOR ARTHRITIS PAIN. 30 tablet 2   No current facility-administered medications on file prior to visit.    Allergies  Allergen Reactions   Erythromycin     Feels terrible!     Assessment/Plan:  1. CHF - BP soft today at 104/76, and he has still noticed infrequent dizziness at home, preventing further titration of Toprol or losartan. Will continue CHF medications including losartan 25mg  daily, Toprol 50mg  BID, spironolactone 25mg  daily, losartan 25mg  daily, and Jardiance 10mg  daily. Will check BMET today with recent spironolactone start to assess K and renal function. Assessed affordability of Jardiance and Eliquis and he has no concerns with copay cards at this time. F/u with PharmD as needed as CHF meds are currently optimized as much as possible given his BP.  Jethro Poling, PharmD assisted in this visit.  Megan E. Supple, PharmD, BCACP, [place] [address], [place], [place] [address] Phone: [phone]; Fax: [phone] 04/20/2021 4:20 PM

## 2021-07-12 ENCOUNTER — Ambulatory Visit: Payer: Medicare Other

## 2021-07-12 ENCOUNTER — Other Ambulatory Visit: Payer: Medicare Other

## 2021-07-12 ENCOUNTER — Ambulatory Visit (INDEPENDENT_AMBULATORY_CARE_PROVIDER_SITE_OTHER): Payer: Medicare Other | Admitting: Pharmacist

## 2021-07-12 ENCOUNTER — Other Ambulatory Visit: Payer: Self-pay

## 2021-07-12 VITALS — BP 104/76 | HR 71 | Wt 173.8 lb

## 2021-07-12 DIAGNOSIS — I5022 Chronic systolic (congestive) heart failure: Secondary | ICD-10-CM | POA: Diagnosis not present

## 2021-07-12 NOTE — Patient Instructions (Signed)
It was nice to meet you today.  Your blood pressure is at goal of <130/80.   We will continue your medications as prescribed.  We will get labs done today and call you tomorrow with the results.

## 2021-07-13 LAB — BASIC METABOLIC PANEL
BUN/Creatinine Ratio: 15 (ref 10–24)
BUN: 16 mg/dL (ref 8–27)
CO2: 24 mmol/L (ref 20–29)
Calcium: 9.9 mg/dL (ref 8.6–10.2)
Chloride: 102 mmol/L (ref 96–106)
Creatinine, Ser: 1.06 mg/dL (ref 0.76–1.27)
Glucose: 96 mg/dL (ref 70–99)
Potassium: 4.6 mmol/L (ref 3.5–5.2)
Sodium: 145 mmol/L — ABNORMAL HIGH (ref 134–144)
eGFR: 76 mL/min/{1.73_m2} (ref >59)

## 2021-07-14 NOTE — Telephone Encounter (Signed)
New ablation date set. Patient has instructions letter at home for ablation. Updated ablation dates and time. Updated lab date and scheduled.  Patient verbalized understanding with read back.

## 2021-07-27 ENCOUNTER — Other Ambulatory Visit: Payer: Self-pay | Admitting: Family Medicine

## 2021-07-27 DIAGNOSIS — M1990 Unspecified osteoarthritis, unspecified site: Secondary | ICD-10-CM

## 2021-07-28 NOTE — Telephone Encounter (Signed)
Last Ov 02/24/20 Last Vv 06/28/21 Is it ok to refill?

## 2021-08-01 ENCOUNTER — Ambulatory Visit: Payer: Medicare Other | Admitting: Cardiology

## 2021-08-15 ENCOUNTER — Encounter: Payer: Self-pay | Admitting: Gastroenterology

## 2021-08-17 ENCOUNTER — Other Ambulatory Visit: Payer: Self-pay | Admitting: Family Medicine

## 2021-08-17 DIAGNOSIS — G43809 Other migraine, not intractable, without status migrainosus: Secondary | ICD-10-CM

## 2021-08-18 ENCOUNTER — Other Ambulatory Visit: Payer: Self-pay | Admitting: Cardiology

## 2021-09-01 ENCOUNTER — Other Ambulatory Visit: Payer: Self-pay

## 2021-09-01 ENCOUNTER — Other Ambulatory Visit: Payer: Medicare Other | Admitting: *Deleted

## 2021-09-01 DIAGNOSIS — I483 Typical atrial flutter: Secondary | ICD-10-CM

## 2021-09-01 LAB — CBC WITH DIFFERENTIAL/PLATELET
Basophils Absolute: 0.1 10*3/uL (ref 0.0–0.2)
Basos: 1 %
EOS (ABSOLUTE): 0.4 10*3/uL (ref 0.0–0.4)
Eos: 7 %
Hematocrit: 43.5 % (ref 37.5–51.0)
Hemoglobin: 15 g/dL (ref 13.0–17.7)
Immature Grans (Abs): 0 10*3/uL (ref 0.0–0.1)
Immature Granulocytes: 0 %
Lymphocytes Absolute: 1.7 10*3/uL (ref 0.7–3.1)
Lymphs: 30 %
MCH: 32.3 pg (ref 26.6–33.0)
MCHC: 34.5 g/dL (ref 31.5–35.7)
MCV: 94 fL (ref 79–97)
Monocytes Absolute: 0.7 10*3/uL (ref 0.1–0.9)
Monocytes: 12 %
Neutrophils Absolute: 2.9 10*3/uL (ref 1.4–7.0)
Neutrophils: 50 %
Platelets: 260 10*3/uL (ref 150–450)
RBC: 4.64 x10E6/uL (ref 4.14–5.80)
RDW: 12 % (ref 11.6–15.4)
WBC: 5.7 10*3/uL (ref 3.4–10.8)

## 2021-09-01 LAB — BASIC METABOLIC PANEL
BUN/Creatinine Ratio: 15 (ref 10–24)
BUN: 14 mg/dL (ref 8–27)
CO2: 24 mmol/L (ref 20–29)
Calcium: 9.9 mg/dL (ref 8.6–10.2)
Chloride: 101 mmol/L (ref 96–106)
Creatinine, Ser: 0.93 mg/dL (ref 0.76–1.27)
Glucose: 94 mg/dL (ref 70–99)
Potassium: 4.5 mmol/L (ref 3.5–5.2)
Sodium: 139 mmol/L (ref 134–144)
eGFR: 89 mL/min/{1.73_m2} (ref >59)

## 2021-09-18 ENCOUNTER — Other Ambulatory Visit: Payer: Self-pay | Admitting: Family Medicine

## 2021-09-18 DIAGNOSIS — M1990 Unspecified osteoarthritis, unspecified site: Secondary | ICD-10-CM

## 2021-09-21 NOTE — Pre-Procedure Instructions (Signed)
Instructed patient on the following items: Arrival time 0830 Nothing to eat or drink after midnight No meds AM of procedure Responsible person to drive you home and stay with you for 24 hrs  Have you missed any doses of anti-coagulant Eliquis- hasn't missed any doses   

## 2021-09-22 ENCOUNTER — Ambulatory Visit (HOSPITAL_COMMUNITY): Payer: Medicare Other | Admitting: Certified Registered Nurse Anesthetist

## 2021-09-22 ENCOUNTER — Encounter (HOSPITAL_COMMUNITY): Payer: Self-pay | Admitting: Cardiology

## 2021-09-22 ENCOUNTER — Ambulatory Visit (HOSPITAL_BASED_OUTPATIENT_CLINIC_OR_DEPARTMENT_OTHER): Payer: Medicare Other | Admitting: Certified Registered Nurse Anesthetist

## 2021-09-22 ENCOUNTER — Ambulatory Visit (HOSPITAL_COMMUNITY)
Admission: RE | Admit: 2021-09-22 | Discharge: 2021-09-22 | Disposition: A | Discharge status: Home / Self Care | Payer: Medicare Other | Attending: Cardiology | Admitting: Cardiology

## 2021-09-22 ENCOUNTER — Other Ambulatory Visit: Payer: Self-pay

## 2021-09-22 ENCOUNTER — Encounter (HOSPITAL_COMMUNITY)
Admission: RE | Disposition: A | Discharge status: Home / Self Care | Payer: Medicare Other | Source: Home / Self Care | Attending: Cardiology

## 2021-09-22 DIAGNOSIS — I483 Typical atrial flutter: Secondary | ICD-10-CM | POA: Insufficient documentation

## 2021-09-22 DIAGNOSIS — Z7901 Long term (current) use of anticoagulants: Secondary | ICD-10-CM | POA: Diagnosis not present

## 2021-09-22 DIAGNOSIS — I4892 Unspecified atrial flutter: Secondary | ICD-10-CM

## 2021-09-22 DIAGNOSIS — I440 Atrioventricular block, first degree: Secondary | ICD-10-CM | POA: Diagnosis not present

## 2021-09-22 DIAGNOSIS — I5022 Chronic systolic (congestive) heart failure: Secondary | ICD-10-CM | POA: Diagnosis not present

## 2021-09-22 DIAGNOSIS — I429 Cardiomyopathy, unspecified: Secondary | ICD-10-CM | POA: Diagnosis not present

## 2021-09-22 DIAGNOSIS — Z79899 Other long term (current) drug therapy: Secondary | ICD-10-CM | POA: Insufficient documentation

## 2021-09-22 DIAGNOSIS — E785 Hyperlipidemia, unspecified: Secondary | ICD-10-CM

## 2021-09-22 DIAGNOSIS — I4891 Unspecified atrial fibrillation: Secondary | ICD-10-CM | POA: Diagnosis not present

## 2021-09-22 DIAGNOSIS — I452 Bifascicular block: Secondary | ICD-10-CM | POA: Insufficient documentation

## 2021-09-22 HISTORY — PX: A-FLUTTER ABLATION: EP1230

## 2021-09-22 SURGERY — A-FLUTTER ABLATION
Anesthesia: General

## 2021-09-22 MED ORDER — ISOPROTERENOL HCL 0.2 MG/ML IJ SOLN
INTRAMUSCULAR | Status: AC
  Filled 2021-09-22: qty 5

## 2021-09-22 MED ORDER — HEPARIN SODIUM (PORCINE) 1000 UNIT/ML IJ SOLN
INTRAMUSCULAR | Status: AC
  Filled 2021-09-22: qty 10

## 2021-09-22 MED ORDER — SODIUM CHLORIDE 0.9 % IV SOLN: INTRAVENOUS | Status: DC

## 2021-09-22 MED ORDER — ISOPROTERENOL HCL 0.2 MG/ML IJ SOLN
INTRAVENOUS | Status: DC | PRN
  Administered 2021-09-22: 4 ug/min via INTRAVENOUS

## 2021-09-22 MED ORDER — ONDANSETRON HCL 4 MG/2ML IJ SOLN
INTRAMUSCULAR | Status: DC | PRN
  Administered 2021-09-22: 4 mg via INTRAVENOUS

## 2021-09-22 MED ORDER — HEPARIN (PORCINE) IN NACL 1000-0.9 UT/500ML-% IV SOLN
INTRAVENOUS | Status: DC | PRN
  Administered 2021-09-22 (×3): 500 mL

## 2021-09-22 MED ORDER — FENTANYL CITRATE (PF) 100 MCG/2ML IJ SOLN
INTRAMUSCULAR | Status: DC | PRN
  Administered 2021-09-22 (×2): 50 ug via INTRAVENOUS

## 2021-09-22 MED ORDER — DEXAMETHASONE SODIUM PHOSPHATE 10 MG/ML IJ SOLN
INTRAMUSCULAR | Status: DC | PRN
  Administered 2021-09-22: 4 mg via INTRAVENOUS

## 2021-09-22 MED ORDER — SODIUM CHLORIDE 0.9% FLUSH: 3.0000 mL | INTRAVENOUS | Status: DC | PRN

## 2021-09-22 MED ORDER — LIDOCAINE 2% (20 MG/ML) 5 ML SYRINGE
INTRAMUSCULAR | Status: DC | PRN
  Administered 2021-09-22: 80 mg via INTRAVENOUS

## 2021-09-22 MED ORDER — ONDANSETRON HCL 4 MG/2ML IJ SOLN: 4.0000 mg | Freq: Four times a day (QID) | INTRAMUSCULAR | Status: DC | PRN

## 2021-09-22 MED ORDER — PHENYLEPHRINE HCL-NACL 20-0.9 MG/250ML-% IV SOLN
INTRAVENOUS | Status: DC | PRN
  Administered 2021-09-22: 40 ug/min via INTRAVENOUS

## 2021-09-22 MED ORDER — HEPARIN SODIUM (PORCINE) 1000 UNIT/ML IJ SOLN
INTRAMUSCULAR | Status: DC | PRN
Start: 2021-09-22 — End: 2021-09-22
  Administered 2021-09-22: 1000 [IU] via INTRAVENOUS

## 2021-09-22 MED ORDER — APIXABAN 5 MG PO TABS
5.0000 mg | ORAL_TABLET | Freq: Two times a day (BID) | ORAL | Status: DC
  Administered 2021-09-22: 5 mg via ORAL
  Filled 2021-09-22: qty 1

## 2021-09-22 MED ORDER — ROCURONIUM BROMIDE 10 MG/ML (PF) SYRINGE
PREFILLED_SYRINGE | INTRAVENOUS | Status: DC | PRN
Start: 2021-09-22 — End: 2021-09-22
  Administered 2021-09-22: 60 mg via INTRAVENOUS

## 2021-09-22 MED ORDER — SODIUM CHLORIDE 0.9% FLUSH: 3.0000 mL | Freq: Two times a day (BID) | INTRAVENOUS | Status: DC

## 2021-09-22 MED ORDER — HEPARIN (PORCINE) IN NACL 1000-0.9 UT/500ML-% IV SOLN
INTRAVENOUS | Status: AC
  Filled 2021-09-22: qty 1500

## 2021-09-22 MED ORDER — ACETAMINOPHEN 325 MG PO TABS
650.0000 mg | ORAL_TABLET | ORAL | Status: DC | PRN
  Filled 2021-09-22: qty 2

## 2021-09-22 MED ORDER — PROPOFOL 10 MG/ML IV BOLUS
INTRAVENOUS | Status: DC | PRN
  Administered 2021-09-22: 100 mg via INTRAVENOUS

## 2021-09-22 MED ORDER — SODIUM CHLORIDE 0.9 % IV SOLN: 250.0000 mL | INTRAVENOUS | Status: DC | PRN

## 2021-09-22 MED ORDER — SUGAMMADEX SODIUM 200 MG/2ML IV SOLN
INTRAVENOUS | Status: DC | PRN
  Administered 2021-09-22: 200 mg via INTRAVENOUS

## 2021-09-22 SURGICAL SUPPLY — 14 items
CATH 8FR REPROCESSED SOUNDSTAR (CATHETERS) ×2 IMPLANT
CATH 8FR SOUNDSTAR REPROCESSED (CATHETERS) IMPLANT
CATH SMTCH THERMOCOOL SF DF (CATHETERS) ×1 IMPLANT
CATH WEB BI DIR CSDF CRV REPRO (CATHETERS) ×1 IMPLANT
CLOSURE PERCLOSE PROSTYLE (VASCULAR PRODUCTS) ×3 IMPLANT
PACK EP LATEX FREE (CUSTOM PROCEDURE TRAY) ×2
PACK EP LF (CUSTOM PROCEDURE TRAY) ×1 IMPLANT
PAD DEFIB RADIO PHYSIO CONN (PAD) ×2 IMPLANT
PATCH CARTO3 (PAD) ×3 IMPLANT
SHEATH CARTO VIZIGO SM CVD (SHEATH) ×1 IMPLANT
SHEATH PINNACLE 8F 10CM (SHEATH) ×2 IMPLANT
SHEATH PINNACLE 9F 10CM (SHEATH) ×1 IMPLANT
SHEATH PROBE COVER 6X72 (BAG) ×1 IMPLANT
TUBING SMART ABLATE COOLFLOW (TUBING) ×1 IMPLANT

## 2021-09-22 NOTE — Progress Notes (Addendum)
Pt ambulated without difficulty or bleeding. Dr Quentin Ore assessed pt post procedure.   Discharged home with wife who will drive and stay with pt x 24 hrs  ?

## 2021-09-22 NOTE — Anesthesia Preprocedure Evaluation (Signed)
Anesthesia Evaluation  ?Patient identified by MRN, date of birth, ID band ?Patient awake ? ? ? ?Reviewed: ?Allergy & Precautions, NPO status , Patient's Chart, lab work & pertinent test results ? ?History of Anesthesia Complications ?(+) PROLONGED EMERGENCE and history of anesthetic complications ? ?Airway ?Mallampati: III ? ?TM Distance: >3 FB ?Neck ROM: Full ? ? ? Dental ?no notable dental hx. ?(+) Teeth Intact, Dental Advisory Given ?  ?Pulmonary ?neg pulmonary ROS,  ?  ?Pulmonary exam normal ?breath sounds clear to auscultation ? ? ? ? ? ? Cardiovascular ?+ dysrhythmias Atrial Fibrillation + Valvular Problems/Murmurs  ?Rhythm:Irregular Rate:Normal ? ?EKG 04/07/21 ?NSR, 1st deg AV Block, RBBB pattern, LAFB ? ?Echo 02/23/21 ?1. Left ventricular ejection fraction, by estimation, is 30 to 35%. The left ventricle has moderately decreased function. The left ventricle demonstrates global hypokinesis. There is mild left ventricular hypertrophy. Left ventricular diastolic parameters are indeterminate.  ??2. Right ventricular systolic function is moderately reduced. The right ventricular size is normal. There is normal pulmonary artery systolic pressure. The estimated right ventricular systolic pressure is 74.1 mmHg.  ??3. The mitral valve is grossly normal. Mild mitral valve regurgitation. No evidence of mitral stenosis.  ??4. The aortic valve is grossly normal. Aortic valve regurgitation is not visualized. No aortic stenosis is present.  ??5. The inferior vena cava is normal in size with greater than 50% respiratory variability, suggesting right atrial pressure of 3 mmHg.  ?  ?Neuro/Psych ? Headaches, negative psych ROS  ? GI/Hepatic ?negative GI ROS, Neg liver ROS,   ?Endo/Other  ?Hyperlipidemia ? Renal/GU ?negative Renal ROS  ?negative genitourinary ?  ?Musculoskeletal ?negative musculoskeletal ROS ?(+)  ? Abdominal ?  ?Peds ? Hematology ?Eliquis therapy- last dose   ?Anesthesia Other  Findings ? ? Reproductive/Obstetrics ?ED ? ?  ? ? ? ? ? ? ? ? ? ? ? ? ? ?  ?  ? ? ? ? ? ? ? ? ?Anesthesia Physical ?Anesthesia Plan ? ?ASA: 3 ? ?Anesthesia Plan: General  ? ?Post-op Pain Management: Minimal or no pain anticipated  ? ?Induction: Intravenous ? ?PONV Risk Score and Plan: 2 and Treatment may vary due to age or medical condition and Ondansetron ? ?Airway Management Planned: Oral ETT ? ?Additional Equipment: None ? ?Intra-op Plan:  ? ?Post-operative Plan: Extubation in OR ? ?Informed Consent: I have reviewed the patients History and Physical, chart, labs and discussed the procedure including the risks, benefits and alternatives for the proposed anesthesia with the patient or authorized representative who has indicated his/her understanding and acceptance.  ? ? ? ?Dental advisory given ? ?Plan Discussed with: CRNA and Anesthesiologist ? ?Anesthesia Plan Comments:   ? ? ? ? ? ? ?Anesthesia Quick Evaluation ? ?

## 2021-09-22 NOTE — H&P (Signed)
?Electrophysiology Office Note:   ?  ?Date:  09/22/2021  ?  ?ID:  Joseph Gill, DOB Apr 18, 1951, MRN [id number] ?  ?PCP:  Billie Ruddy, MD       ?[facility] Cardiologist:  None  ?[place] Electrophysiologist:  None  ?  ?Referring MD: Oliver Barre, PA  ?  ?Chief Complaint: Atrial flutter ?  ?History of Present Illness:   ?  ?Joseph Gill is a 71 y.o. male who presents for an evaluation of typical atrial flutter and cardiomyopathy at the request of Adline Peals, PA-C. Their medical history includes hyperlipidemia, migraine headaches.  The patient was seen by Adline Peals on February 23, 2021.  His diagnosis of atrial flutter was made on February 20, 2021 when he presented to his primary care physician with 3 days of chest tightness, palpitations and lightheadedness.  His primary care physician referred him to the emergency department where EKG showed typical atrial flutter.  He was started on metoprolol.  Since his appointment with Audry Pili, he has had intermittent episodes of atrial flutter.  He tells me that his heart rate will jump up and he is symptomatic.  Today when I asked him about if he had had similar symptoms prior to his appointment with his primary care physician, he thinks maybe for the last few months he has experienced salvos of atrial flutter.  He has never been diagnosed with atrial fibrillation. ?  ?  ?Since I met him, he has established in our PharmD clinic for HF med optimization. ? ?Objective  ?  ?    ?Past Medical History:  ?Diagnosis Date  ? ALLERGIC RHINITIS 03/26/2007  ? Allergy    ? Back pain    ?  knee and leg pain  ? Cataract    ? Chronically dry eyes, bilateral    ? Complication of anesthesia    ?  Per pt, hard to wake up past certain sedation!  ? 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 Heart murmur    ? Hyperlipidemia    ? Irregular heart beat    ? MIGRAINE HEADACHE 11/03/2008  ?  ?  ?     ?Past Surgical History:  ?Procedure Laterality Date  ? CATARACT EXTRACTION,  BILATERAL   2015  ? NASAL SEPTUM SURGERY      ? NASAL SINUS SURGERY      ? TONSILLECTOMY      ?  ?  ?Current Medications: ?Active Medications  ?    ?Current Meds  ?Medication Sig  ? acetaminophen (TYLENOL) 500 MG tablet Take 500 mg by mouth as needed.  ? amitriptyline (ELAVIL) 25 MG tablet TAKE 1 TABLET BY MOUTH EVERYDAY AT BEDTIME  ? apixaban (ELIQUIS) 5 MG TABS tablet Take 1 tablet (5 mg total) by mouth 2 (two) times daily.  ? atorvastatin (LIPITOR) 40 MG tablet Take 1 tablet (40 mg total) by mouth daily.  ? loratadine (CLARITIN) 10 MG tablet Take 10 mg by mouth as needed.   ? metoprolol succinate (TOPROL XL) 25 MG 24 hr tablet Take 1 tablet (25 mg total) by mouth daily.  ? mometasone (NASONEX) 50 MCG/ACT nasal spray PLACE 2 SPRAYS INTO THE NOSE  DAILY.  ? Multiple Vitamin (MULTIVITAMIN) tablet Take 1 tablet by mouth daily.  ? pseudoephedrine (SUDAFED) 30 MG tablet Take 30 mg by mouth as needed for congestion.  ? sacubitril-valsartan (ENTRESTO) 24-26 MG Take 1 tablet by mouth 2 (two) times daily.  ? SUMAtriptan (IMITREX) 100 MG tablet May  repeat in 2 hours if headache persists or recurs.  ? Testosterone 20.25 MG/ACT (1.62%) GEL USE 2 PUMPS ON THE SKIN EVERY MORNING AS DIRECTED  ? traMADol (ULTRAM) 50 MG tablet TAKE 1 TABLET EVERY 12 HOURS AS NEEDED FOR ARTHRITIS PAIN.  ?  ?  ?  ?Allergies:   Erythromycin  ?  ?Social History  ?  ?     ?Socioeconomic History  ? Marital status: Married  ?    Spouse name: Not on file  ? Number of children: Not on file  ? Years of education: Not on file  ? Highest education level: Not on file  ?Occupational History  ? Not on file  ?Tobacco Use  ? Smoking status: Never  ? Smokeless tobacco: Never  ?Substance and Sexual Activity  ? Alcohol use: Yes  ?    Alcohol/week: 4.0 standard drinks  ?    Types: 1 Glasses of wine, 1 Cans of beer, 1 Shots of liquor, 1 Standard drinks or equivalent per week  ?    Comment: occ  ? Drug use: No  ? Sexual activity: Not on file  ?Other Topics Concern  ?  Not on file  ?Social History Narrative  ? Not on file  ?  ?Social Determinants of Health  ?  ?   ?Financial Resource Strain: Low Risk   ? Difficulty of Paying Living Expenses: Not hard at all  ?Food Insecurity: No Food Insecurity  ? Worried About Charity fundraiser in the Last Year: Never true  ? Ran Out of Food in the Last Year: Never true  ?Transportation Needs: No Transportation Needs  ? Lack of Transportation (Medical): No  ? Lack of Transportation (Non-Medical): No  ?Physical Activity: Inactive  ? Days of Exercise per Week: 0 days  ? Minutes of Exercise per Session: 0 min  ?Stress: No Stress Concern Present  ? Feeling of Stress : Not at all  ?Social Connections: Socially Isolated  ? Frequency of Communication with Friends and Family: Once a week  ? Frequency of Social Gatherings with Friends and Family: Once a week  ? Attends Religious Services: Never  ? Active Member of Clubs or Organizations: No  ? Attends Archivist Meetings: Never  ? Marital Status: Married  ?  ?  ?Family History: ?The patient's family history includes Heart disease in his father, maternal grandfather, and mother; Lung cancer in his maternal uncle; Prostate cancer in his paternal grandfather. ?  ?ROS:   ?Please see the history of present illness.    ?All other systems reviewed and are negative. ?  ?EKGs/Labs/Other Studies Reviewed:   ?  ?The following studies were reviewed today: ?  ?03/16/21 echo personally reviewed ?Left ventricular function moderately decreased, 30% ?Right ventricular function moderately decreased ?Mild MR ?  ?  ?EKG:  The ekg ordered today demonstrates sinus rhythm, first-degree AV delay, right bundle branch block and left anterior fascicular block ?  ?  ?Recent Labs: ?02/20/2021: ALT 24; BUN 17; Creatinine, Ser 0.80; Hemoglobin 16.0; Magnesium 2.0; Platelets 270; Potassium 3.8; Sodium 141  ?Recent Lipid Panel ?Labs (Brief)  ?     ?   ?Component Value Date/Time  ?  CHOL 161 02/26/2020 0904  ?  TRIG  146 02/26/2020 0904  ?  HDL 53 02/26/2020 0904  ?  CHOLHDL 3.0 02/26/2020 0904  ?  VLDL 64.6 (H) 04/02/2019 1409  ?  [place] 83 02/26/2020 0904  ?  LDLDIRECT 82.0 04/02/2019 1409  ?  ?  ?  ?  Physical Exam:   ?  ?VS:  BP 124/80   Pulse 81   Ht _0  (1.778 m)   Wt 175 lb (79.4 kg)   BMI 25.11 kg/m?    ?  ?   ?Wt Readings from Last 3 Encounters:  ?04/07/21 175 lb (79.4 kg)  ?03/07/21 172 lb 14.4 oz (78.4 kg)  ?02/23/21 172 lb 12.8 oz (78.4 kg)  ?  ?  ?GEN:  Well nourished, well developed in no acute distress ?HEENT: Normal ?NECK: No JVD; No carotid bruits ?LYMPHATICS: No lymphadenopathy ?CARDIAC: RRR, no murmurs, rubs, gallops ?RESPIRATORY:  Clear to auscultation without rales, wheezing or rhonchi  ?ABDOMEN: Soft, non-tender, non-distended ?MUSCULOSKELETAL:  No edema; No deformity  ?SKIN: Warm and dry ?NEUROLOGIC:  Alert and oriented x 3 ?PSYCHIATRIC:  Normal affect  ?  ?  ?  ?  ?Assessment ?  ?  ?ASSESSMENT:   ?  ?1. Chronic systolic heart failure ([place])   ?2. Typical atrial flutter ([place])   ?3. RBBB   ?4. LAFB (left anterior fascicular block)   ?5. First degree AV block   ?  ?PLAN:   ?  ?In order of problems listed above: ?  ?#Chronic systolic heart failure ?Relatively new diagnosis.  Has not had an ischemic work-up.  Is also possible that he has had a tachycardia mediated cardiomyopathy.  Interestingly he has significant conduction disease on his baseline EKG that could be connected. ? ?He is on Toprol-XL but will need to be initiated on guideline directed medical therapy for his cardiomyopathy.  I will plan to start Entresto today and have him follow-up with the pharmacy team for further titration and monitoring. ? ?Rhythm control be important as we address his new systolic heart failure. ?  ?I will also get an exercise nuclear stress test to further investigate the possibility of coronary artery disease. ? ?#Typical atrial flutter ?Rhythm control is indicated.  I discussed antiarrhythmic drugs and ablation for  his typical atrial flutter and he wishes to proceed with ablation.  I discussed the procedure in detail including the risks, recovery and efficacy.  He is on Eliquis 5 mg by mouth twice daily for stroke prophyl

## 2021-09-22 NOTE — Anesthesia Procedure Notes (Signed)
Procedure Name: Intubation ?Date/Time: 09/22/2021 10:30 AM ?Performed by: Genelle Bal, CRNA ?Pre-anesthesia Checklist: Patient identified, Emergency Drugs available, Suction available and Patient being monitored ?Patient Re-evaluated:Patient Re-evaluated prior to induction ?Oxygen Delivery Method: Circle system utilized ?Preoxygenation: Pre-oxygenation with 100% oxygen ?Induction Type: IV induction ?Ventilation: Mask ventilation without difficulty ?Laryngoscope Size: Sabra Heck and 2 ?Grade View: Grade I ?Tube type: Oral ?Tube size: 7.5 mm ?Number of attempts: 1 ?Airway Equipment and Method: Stylet and Oral airway ?Placement Confirmation: ETT inserted through vocal cords under direct vision, positive ETCO2 and breath sounds checked- equal and bilateral ?Secured at: 22 cm ?Tube secured with: Tape ?Dental Injury: Teeth and Oropharynx as per pre-operative assessment  ? ? ? ? ?

## 2021-09-22 NOTE — Transfer of Care (Signed)
Immediate Anesthesia Transfer of Care Note ? ?Patient: ALESSIO BOGAN ? ?Procedure(s) Performed: A-FLUTTER ABLATION ? ?Patient Location: Cath Lab ? ?Anesthesia Type:General ? ?Level of Consciousness: awake, alert  and oriented ? ?Airway & Oxygen Therapy: Patient Spontanous Breathing and Patient connected to nasal cannula oxygen ? ?Post-op Assessment: Report given to RN and Post -op Vital signs reviewed and stable ? ?Post vital signs: Reviewed and stable ? ?Last Vitals:  ?Vitals Value Taken Time  ?BP 100/58 09/22/21 1227  ?Temp 36.7 ?C 09/22/21 1227  ?Pulse 71 09/22/21 1228  ?Resp 18 09/22/21 1228  ?SpO2 98 % 09/22/21 1228  ?Vitals shown include unvalidated device data. ? ?Last Pain:  ?Vitals:  ? 09/22/21 1227  ?TempSrc: Temporal  ?PainSc: 0-No pain  ?   ? ?  ? ?Complications: No notable events documented. ?

## 2021-09-22 NOTE — Discharge Instructions (Signed)

## 2021-09-23 NOTE — Anesthesia Postprocedure Evaluation (Signed)
Anesthesia Post Note ? ?Patient: Joseph Gill ? ?Procedure(s) Performed: A-FLUTTER ABLATION ? ?  ? ?Patient location during evaluation: PACU ?Anesthesia Type: General ?Level of consciousness: awake and alert and oriented ?Pain management: pain level controlled ?Vital Signs Assessment: post-procedure vital signs reviewed and stable ?Respiratory status: spontaneous breathing, nonlabored ventilation and respiratory function stable ?Cardiovascular status: blood pressure returned to baseline and stable ?Postop Assessment: no apparent nausea or vomiting ?Anesthetic complications: no ? ? ?No notable events documented. ? ?Last Vitals:  ?Vitals:  ? 09/22/21 1630 09/22/21 1645  ?BP: 125/76   ?Pulse: 85 85  ?Resp: 19 19  ?Temp:    ?SpO2: 94% 92%  ?  ?Last Pain:  ?Vitals:  ? 09/22/21 1645  ?TempSrc:   ?PainSc: 0-No pain  ? ? ?  ?  ?  ?  ?  ?  ? ?Jeraline Marcinek A. ? ? ? ? ?

## 2021-09-25 ENCOUNTER — Encounter (HOSPITAL_COMMUNITY): Payer: Self-pay | Admitting: Cardiology

## 2021-11-01 NOTE — Progress Notes (Signed)
?Electrophysiology Office Follow up Visit Note:   ? ?Date:  11/02/2021  ? ?ID:  Joseph Gill, DOB 1951/02/08, MRN [id number] ? ?PCP:  Joseph Ruddy, MD  ?[facility] Cardiologist:  None  ?[facility] Electrophysiologist:  Joseph Epley, MD  ? ? ?Interval History:   ? ?Joseph Gill is a 71 y.o. male who presents for a follow up visit. They were last seen in clinic 04/07/2021. ? ?Since their last appointment, they underwent atrial flutter ablation on 09/22/2021. Successful radiofrequency ablation of atrial flutter along the cavotricuspid isthmus with complete bidirectional isthmus block achieved. ? ?Overall, he is feeling good. He has noticed a little bit of improvement in his energy levelsHe reports some episodes of significant lightheadedness. Usually he is able to sit down and wait for his lightheadedness to resolve spontaneously in a few minutes. ? ?He denies any palpitations, chest pain, shortness of breath, or peripheral edema. No headaches, orthopnea, or PND. ? ?He is planning a 4 week trip in September 2023. ? ?  ? ?Past Medical History:  ?Diagnosis Date  ? ALLERGIC RHINITIS 03/26/2007  ? Allergy   ? Back pain   ? knee and leg pain  ? Cataract   ? Chronically dry eyes, bilateral   ? Complication of anesthesia   ? Per pt, hard to wake up past certain sedation!  ? 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 Heart murmur   ? Hyperlipidemia   ? Irregular heart beat   ? MIGRAINE HEADACHE 11/03/2008  ? ? ?Past Surgical History:  ?Procedure Laterality Date  ? A-FLUTTER ABLATION N/A 09/22/2021  ? Procedure: A-FLUTTER ABLATION;  Surgeon: Joseph Epley, MD;  Location: [facility];  Service: Cardiovascular;  Laterality: N/A;  ? CATARACT EXTRACTION, BILATERAL  2015  ? NASAL SEPTUM SURGERY    ? NASAL SINUS SURGERY    ? TONSILLECTOMY    ? ? ?Current Medications: ?Current Meds  ?Medication Sig  ? acetaminophen (TYLENOL) 500 MG tablet Take 1,000 mg by mouth every 8 (eight) hours as needed for  moderate pain.  ? amitriptyline (ELAVIL) 25 MG tablet TAKE 1 TABLET BY MOUTH EVERYDAY AT BEDTIME (Patient taking differently: Take 25 mg by mouth at bedtime as needed for sleep.)  ? apixaban (ELIQUIS) 5 MG TABS tablet TAKE 1 TABLET BY MOUTH TWICE A DAY  ? Ascorbic Acid (VITAMIN C) 1000 MG tablet Take 1,000 mg by mouth every Monday, Tuesday, Wednesday, Thursday, and Friday.  ? atorvastatin (LIPITOR) 40 MG tablet TAKE 1 TABLET BY MOUTH EVERY DAY  ? Carboxymethylcellulose Sodium 1 % GEL Place 1 drop into both eyes daily as needed (dry eyes).  ? diphenhydrAMINE (BENADRYL) 25 MG tablet Take 25 mg by mouth every 6 (six) hours as needed for allergies.  ? diphenhydramine-acetaminophen (TYLENOL PM) 25-500 MG TABS tablet Take 1 tablet by mouth at bedtime.  ? empagliflozin (JARDIANCE) 10 MG TABS tablet Take 1 tablet (10 mg total) by mouth daily before breakfast.  ? loratadine (CLARITIN) 10 MG tablet Take 10 mg by mouth in the morning.  ? losartan (COZAAR) 25 MG tablet TAKE 1 TABLET (25 MG TOTAL) BY MOUTH DAILY. (Patient taking differently: Take 25 mg by mouth at bedtime.)  ? metoprolol succinate (TOPROL-XL) 50 MG 24 hr tablet Take 50 mg by mouth 2 (two) times daily.  ? Multiple Vitamin (MULTIVITAMIN WITH MINERALS) TABS tablet Take 1 tablet by mouth in the morning.  ? pseudoephedrine (SUDAFED) 30 MG tablet Take 30 mg by mouth See admin  instructions. Take 1 tablet (30 mg) scheduled by mouth in the morning & may take an additional dose if needed for allergies/sinus issues.  ? spironolactone (ALDACTONE) 25 MG tablet Take 1 tablet (25 mg total) by mouth daily.  ? SUMAtriptan (IMITREX) 100 MG tablet May repeat in 2 hours if headache persists or recurs. (Patient taking differently: Take 100 mg by mouth every 2 (two) hours as needed for headache. May repeat in 2 hours if headache persists or recurs.)  ? traMADol (ULTRAM) 50 MG tablet TAKE 1 TABLET EVERY 12 HOURS AS NEEDED FOR ARTHRITIS PAIN(NEEDS APPT PER DOCTOR)  ? triamcinolone  (NASACORT) 55 MCG/ACT AERO nasal inhaler Place 1 spray into the nose daily.  ?  ? ?Allergies:   Erythromycin  ? ?Social History  ? ?Socioeconomic History  ? Marital status: Married  ?  Spouse name: Not on file  ? Number of children: Not on file  ? Years of education: Not on file  ? Highest education level: Not on file  ?Occupational History  ? Not on file  ?Tobacco Use  ? Smoking status: Never  ? Smokeless tobacco: Never  ?Substance and Sexual Activity  ? Alcohol use: Yes  ?  Alcohol/week: 4.0 standard drinks  ?  Types: 1 Glasses of wine, 1 Cans of beer, 1 Shots of liquor, 1 Standard drinks or equivalent per week  ?  Comment: occ  ? Drug use: No  ? Sexual activity: Not on file  ?Other Topics Concern  ? Not on file  ?Social History Narrative  ? Not on file  ? ?Social Determinants of Health  ? ?Financial Resource Strain: Low Risk   ? Difficulty of Paying Living Expenses: Not hard at all  ?Food Insecurity: No Food Insecurity  ? Worried About Charity fundraiser in the Last Year: Never true  ? Ran Out of Food in the Last Year: Never true  ?Transportation Needs: No Transportation Needs  ? Lack of Transportation (Medical): No  ? Lack of Transportation (Non-Medical): No  ?Physical Activity: Inactive  ? Days of Exercise per Week: 0 days  ? Minutes of Exercise per Session: 0 min  ?Stress: No Stress Concern Present  ? Feeling of Stress : Not at all  ?Social Connections: Socially Isolated  ? Frequency of Communication with Friends and Family: Once a week  ? Frequency of Social Gatherings with Friends and Family: Once a week  ? Attends Religious Services: Never  ? Active Member of Clubs or Organizations: No  ? Attends Archivist Meetings: Never  ? Marital Status: Married  ?  ? ?Family History: ?The patient's family history includes Heart disease in his father, maternal grandfather, and mother; Lung cancer in his maternal uncle; Prostate cancer in his paternal grandfather. ? ?ROS:   ?Please see the history of present  illness.    ?(+) Lightheadedness ?All other systems reviewed and are negative. ? ?EKGs/Labs/Other Studies Reviewed:   ? ?The following studies were reviewed today: ? ?09/22/2021  Atrial Flutter Ablation ?CONCLUSIONS:  ? 1. Typical atrial flutter  ? 2. Successful radiofrequency ablation of atrial flutter along the cavotricuspid isthmus with complete bidirectional isthmus block achieved.  ? 3. No inducible arrhythmias following ablation.  ? 4. No early apparent complications.  ? ?04/14/2021  Lexiscan Myoview  ?  Findings are consistent with no ischemia. The study is low risk. ?  No ST deviation was noted. ?  LV perfusion is normal. There is no evidence of ischemia. There is no evidence  of infarction. ?  Left ventricular function is abnormal. Global function is moderately reduced. Nuclear stress EF: 43 %. The left ventricular ejection fraction is moderately decreased (30-44%). End diastolic cavity size is normal. ?  Prior study not available for comparison. ?  ?Low risk stress nuclear study with normal perfusion pattern. ?Gated images suggest reduced left ventricular global systolic function. Calculations can be erroneous with atrial fibrillation; recommend correlation with echocardiography. ? ?02/23/2021  Echo ?1. Left ventricular ejection fraction, by estimation, is 30 to 35%. The  ?left ventricle has moderately decreased function. The left ventricle  ?demonstrates global hypokinesis. There is mild left ventricular  ?hypertrophy. Left ventricular diastolic  ?parameters are indeterminate.  ? 2. Right ventricular systolic function is moderately reduced. The right  ?ventricular size is normal. There is normal pulmonary artery systolic  ?pressure. The estimated right ventricular systolic pressure is 58.8 mmHg.  ? 3. The mitral valve is grossly normal. Mild mitral valve regurgitation.  ?No evidence of mitral stenosis.  ? 4. The aortic valve is grossly normal. Aortic valve regurgitation is not  ?visualized. No aortic stenosis  is present.  ? 5. The inferior vena cava is normal in size with greater than 50%  ?respiratory variability, suggesting right atrial pressure of 3 mmHg.  ? ?Conclusion(s)/Recommendation(s): Paroxysmal afib dur

## 2021-11-02 ENCOUNTER — Encounter: Payer: Self-pay | Admitting: Cardiology

## 2021-11-02 ENCOUNTER — Ambulatory Visit (INDEPENDENT_AMBULATORY_CARE_PROVIDER_SITE_OTHER): Payer: Medicare Other | Admitting: Cardiology

## 2021-11-02 VITALS — BP 106/78 | HR 63 | Ht 70.0 in | Wt 172.8 lb

## 2021-11-02 DIAGNOSIS — I483 Typical atrial flutter: Secondary | ICD-10-CM | POA: Diagnosis not present

## 2021-11-02 DIAGNOSIS — I5022 Chronic systolic (congestive) heart failure: Secondary | ICD-10-CM

## 2021-11-02 DIAGNOSIS — I429 Cardiomyopathy, unspecified: Secondary | ICD-10-CM | POA: Diagnosis not present

## 2021-11-02 LAB — CBC
Hematocrit: 43.3 % (ref 37.5–51.0)
Hemoglobin: 14.6 g/dL (ref 13.0–17.7)
MCH: 32 pg (ref 26.6–33.0)
MCHC: 33.7 g/dL (ref 31.5–35.7)
MCV: 95 fL (ref 79–97)
Platelets: 271 10*3/uL (ref 150–450)
RBC: 4.56 x10E6/uL (ref 4.14–5.80)
RDW: 14.8 % (ref 11.6–15.4)
WBC: 6.6 10*3/uL (ref 3.4–10.8)

## 2021-11-02 NOTE — Patient Instructions (Addendum)
Medication Instructions:  ?Your physician recommends that you continue on your current medications as directed. Please refer to the Current Medication list given to you today. ?*If you need a refill on your cardiac medications before your next appointment, please call your pharmacy* ? ?Lab Work: ?CBC ?If you have labs (blood work) drawn today and your tests are completely normal, you will receive your results only by: ?MyChart Message (if you have MyChart) OR ?A paper copy in the mail ?If you have any lab test that is abnormal or we need to change your treatment, we will call you to review the results. ? ?Testing/Procedures: ?Your physician has requested that you have a cardiac MRI. Cardiac MRI uses a computer to create images of your heart as its beating, producing both still and moving pictures of your heart and major blood vessels. For further information please visit [email or web address] Please follow the instruction sheet given to you today for more information. ? ?Follow-Up: ?At [facility], you and your health needs are our priority.  As part of our continuing mission to provide you with exceptional heart care, we have created designated Provider Care Teams.  These Care Teams include your primary Cardiologist (physician) and Advanced Practice Providers (APPs -  Physician Assistants and Nurse Practitioners) who all work together to provide you with the care you need, when you need it. ? ?Your physician wants you to follow-up in: 6-8 weeks with Lars Mage, MD (post Cardiac MRI) ? ?We recommend signing up for the patient portal called "MyChart".  Sign up information is provided on this After Visit Summary.  MyChart is used to connect with patients for Virtual Visits (Telemedicine).  Patients are able to view lab/test results, encounter notes, upcoming appointments, etc.  Non-urgent messages can be sent to your provider as well.   ?To learn more about what you can do with MyChart, go to  [email or web address].   ? ?Any Other Special Instructions Will Be Listed Below (If Applicable). ? ? ? ?You are scheduled for Cardiac MRI on ______________. Please arrive for your appointment at ______________ ( arrive 30-45 minutes prior to test start time). ? ? ?[facility] ?[address] ?[place],  [address] ?[phone] ?Please take advantage of the free valet parking available at the MAIN entrance (A entrance).  ?Proceed to the [facility] Radiology Department (First Floor) for check-in. ? ?Magnetic resonance imaging (MRI) is a painless test that produces images of the inside of the body without using Xrays. ? ?During an MRI, strong magnets and radio waves work together in a Research officer, political party to form detailed images.  ? ?MRI images may provide more details about a medical condition than X-rays, CT scans, and ultrasounds can provide. ? ?You may be given earphones to listen for instructions. ? ?You may eat a light breakfast and take medications as ordered with the exception of HCTZ (fluid pill, other). Please avoid stimulants for 12 hr prior to test. (Ie. Caffeine, nicotine, chocolate, or antihistamine medications) ? ?If a contrast material will be used, an IV will be inserted into one of your veins. Contrast material will be injected into your IV. It will leave your body through your urine within a day. You may be told to drink plenty of fluids to help flush the contrast material out of your system. ? ?You will be asked to remove all metal, including: Watch, jewelry, and other metal objects including hearing aids, hair pieces and dentures. Also wearable glucose monitoring systems (ie. [employer] and [employer]) (  Braces and fillings normally are not a problem.) ? ? ?TEST WILL TAKE APPROXIMATELY 1 HOUR ? ?PLEASE NOTIFY SCHEDULING AT LEAST 24 HOURS IN ADVANCE IF YOU ARE UNABLE TO KEEP YOUR APPOINTMENT. ?[phone] ? ?Please call Marchia Bond, cardiac imaging nurse navigator with any  questions/concerns. ?Marchia Bond RN Navigator Cardiac Imaging ?Gordy Clement RN Navigator Cardiac Imaging ?[facility] ?[phone] Office  ? ? ? ?  ? ? ?

## 2021-11-04 ENCOUNTER — Other Ambulatory Visit: Payer: Self-pay | Admitting: Cardiology

## 2021-11-16 ENCOUNTER — Other Ambulatory Visit: Payer: Self-pay | Admitting: Family Medicine

## 2021-11-16 DIAGNOSIS — G43809 Other migraine, not intractable, without status migrainosus: Secondary | ICD-10-CM

## 2021-12-06 ENCOUNTER — Other Ambulatory Visit: Payer: Self-pay | Admitting: Family Medicine

## 2021-12-06 DIAGNOSIS — M1990 Unspecified osteoarthritis, unspecified site: Secondary | ICD-10-CM

## 2021-12-13 ENCOUNTER — Other Ambulatory Visit: Payer: Self-pay | Admitting: Cardiology

## 2021-12-18 ENCOUNTER — Telehealth (HOSPITAL_COMMUNITY): Payer: Self-pay | Admitting: Emergency Medicine

## 2021-12-19 ENCOUNTER — Ambulatory Visit (HOSPITAL_COMMUNITY)
Admission: RE | Admit: 2021-12-19 | Discharge: 2021-12-19 | Disposition: A | Discharge status: Home / Self Care | Payer: Medicare Other | Source: Ambulatory Visit | Attending: Cardiology | Admitting: Cardiology

## 2021-12-19 DIAGNOSIS — I5022 Chronic systolic (congestive) heart failure: Secondary | ICD-10-CM | POA: Insufficient documentation

## 2021-12-19 DIAGNOSIS — I483 Typical atrial flutter: Secondary | ICD-10-CM | POA: Diagnosis not present

## 2021-12-19 DIAGNOSIS — I429 Cardiomyopathy, unspecified: Secondary | ICD-10-CM | POA: Diagnosis not present

## 2021-12-19 MED ORDER — GADOBUTROL 1 MMOL/ML IV SOLN
8.0000 mL | Freq: Once | INTRAVENOUS | Status: AC | PRN
  Administered 2021-12-19: 8 mL via INTRAVENOUS

## 2021-12-26 NOTE — Progress Notes (Unsigned)
Electrophysiology Office Follow up Visit Note:    Date:  12/27/2021   ID:  Joseph Gill, DOB [birth year], MRN [id number]  PCP:  Billie Ruddy, MD  [place] Cardiologist:  None  [facility] Electrophysiologist:  Vickie Epley, MD    Interval History:    Joseph Gill is a 71 y.o. male who presents for a follow up visit. They were last seen in clinic Nov 02, 2021.  She had a prior flutter ablation on September 22, 2021.  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 He is doing well after his flutter ablation.  No recurrence.  His EF has improved.       Past Medical History:  Diagnosis Date   ALLERGIC RHINITIS 03/26/2007   Allergy    Back pain    knee and leg pain   Cataract    Chronically dry eyes, bilateral    Complication of anesthesia    Per pt, hard to wake up past certain sedation!   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     Allergies:   Erythromycin   Social History   Socioeconomic History   Marital status: Married    Spouse name: Not on file   Number of children: Not on file   Years of education: Not on file   Highest education level: Not on file  Occupational History   Not on file  Tobacco Use   Smoking status: Never   Smokeless  tobacco: Never  Substance and Sexual Activity   Alcohol use: Yes    Alcohol/week: 4.0 standard drinks of alcohol    Types: 1 Glasses of wine, 1 Cans of beer, 1 Shots of liquor, 1 Standard drinks or equivalent per week    Comment: occ   Drug use: No   Sexual activity: Not on file  Other Topics Concern   Not on file  Social History Narrative   Not on file   Social Determinants of Health   Financial Resource Strain: Low Risk  (03/07/2021)   Overall Financial Resource Strain (CARDIA)    Difficulty of Paying Living Expenses: Not hard at all  Food Insecurity: No Food Insecurity (03/07/2021)   Hunger Vital Sign    Worried About Running Out of Food in the Last Year: Never true    Ran Out of Food in the Last Year: Never true  Transportation Needs: No Transportation Needs (03/07/2021)   PRAPARE - Hydrologist (Medical): No    Lack of Transportation (Non-Medical): No  Physical Activity: Inactive (03/07/2021)   Exercise Vital Sign    Days of Exercise per Week: 0 days    Minutes of Exercise per Session: 0 min  Stress: No Stress Concern Present (03/07/2021)  [place] Questionnaire    Feeling of Stress : Not at all  Social Connections: Socially Isolated (03/07/2021)   Social Connection and Isolation Panel [NHANES]    Frequency of Communication with Friends and Family: Once a week    Frequency of Social Gatherings with Friends and Family: Once a week    Attends Religious Services: Never    Marine scientist or Organizations: No    Attends Music therapist: Never    Marital Status: Married     Family History: The patient's family history includes Heart disease in his father, maternal grandfather, and mother; Lung cancer in his maternal uncle; Prostate cancer in his paternal grandfather.  ROS:   Please see the history of present illness.    All other systems reviewed and are  negative.  EKGs/Labs/Other Studies Reviewed:    The following studies were reviewed today:   December 19, 2021 cardiac MRI Mild left ventricular dysfunction, 48% Normal LV size   EKG:  The ekg ordered today demonstrates sinus rhythm.  Right bundle branch block.  Recent Labs: 02/20/2021: ALT 24; Magnesium 2.0 09/01/2021: BUN 14; Creatinine, Ser 0.93; Potassium 4.5; Sodium 139 11/02/2021: Hemoglobin 14.6; Platelets 271  Recent Lipid Panel    Component Value Date/Time   CHOL 161 02/26/2020 0904   TRIG 146 02/26/2020 0904   HDL 53 02/26/2020 0904   CHOLHDL 3.0 02/26/2020 0904   VLDL 64.6 (H) 04/02/2019 1409   LDLCALC 83 02/26/2020 0904   LDLDIRECT 82.0 04/02/2019 1409    Physical Exam:    VS:  BP 100/70   Pulse 69   Ht '5\' 10"'$  (1.778 m)   Wt 173 lb (78.5 kg)   SpO2 93%   BMI 24.82 kg/m     Wt Readings from Last 3 Encounters:  12/27/21 173 lb (78.5 kg)  11/02/21 172 lb 12.8 oz (78.4 kg)  09/22/21 174 lb (78.9 kg)     GEN:  Well nourished, well developed in no acute distress HEENT: Normal NECK: No JVD; No carotid bruits LYMPHATICS: No lymphadenopathy CARDIAC: RRR, no murmurs, rubs, gallops RESPIRATORY:  Clear to auscultation without rales, wheezing or rhonchi  ABDOMEN: Soft, non-tender, non-distended MUSCULOSKELETAL:  No edema; No deformity  SKIN: Warm and dry NEUROLOGIC:  Alert and oriented x 3 PSYCHIATRIC:  Normal affect        ASSESSMENT:    1. Chronic systolic heart failure ([facility])   2. Typical atrial flutter (HCC)    PLAN:    In order of problems listed above:  Chronic systolic heart failure EF is improved now that he is in normal rhythm.  Continue spironolactone, metoprolol, losartan.  #Typical atrial flutter Maintaining normal rhythm after his ablation.  Continue Eliquis.  Consider discontinuing Eliquis if no recurrence at the next appointment.  Consider loop recorder at follow up.  Follow-up 1 year or sooner as needed.   Medication  Adjustments/Labs and Tests Ordered: Current medicines are reviewed at length with the patient today.  Concerns regarding medicines are outlined above.  Orders Placed This Encounter  Procedures   EKG 12-Lead   No orders of the defined types were placed in this encounter.    Signed, Lars Mage, MD, [facility], [facility] 12/27/2021 3:30 PM    Electrophysiology [facility]

## 2021-12-27 ENCOUNTER — Ambulatory Visit (INDEPENDENT_AMBULATORY_CARE_PROVIDER_SITE_OTHER): Payer: Medicare Other | Admitting: Cardiology

## 2021-12-27 ENCOUNTER — Encounter: Payer: Self-pay | Admitting: Cardiology

## 2021-12-27 VITALS — BP 100/70 | HR 69 | Ht 70.0 in | Wt 173.0 lb

## 2021-12-27 DIAGNOSIS — I5022 Chronic systolic (congestive) heart failure: Secondary | ICD-10-CM | POA: Diagnosis not present

## 2021-12-27 DIAGNOSIS — I483 Typical atrial flutter: Secondary | ICD-10-CM

## 2021-12-27 NOTE — Patient Instructions (Signed)

## 2022-01-14 ENCOUNTER — Other Ambulatory Visit: Payer: Self-pay | Admitting: Cardiology

## 2022-01-14 ENCOUNTER — Other Ambulatory Visit: Payer: Self-pay | Admitting: Family Medicine

## 2022-01-14 DIAGNOSIS — G43809 Other migraine, not intractable, without status migrainosus: Secondary | ICD-10-CM

## 2022-01-15 ENCOUNTER — Other Ambulatory Visit: Payer: Self-pay

## 2022-01-15 MED ORDER — METOPROLOL SUCCINATE ER 50 MG PO TB24: 50.0000 mg | ORAL_TABLET | Freq: Two times a day (BID) | ORAL | 3 refills | Status: DC

## 2022-01-29 ENCOUNTER — Other Ambulatory Visit: Payer: Self-pay | Admitting: Family Medicine

## 2022-01-29 DIAGNOSIS — G43809 Other migraine, not intractable, without status migrainosus: Secondary | ICD-10-CM

## 2022-01-29 DIAGNOSIS — E785 Hyperlipidemia, unspecified: Secondary | ICD-10-CM

## 2022-02-20 ENCOUNTER — Other Ambulatory Visit: Payer: Self-pay | Admitting: *Deleted

## 2022-03-06 ENCOUNTER — Telehealth: Payer: Self-pay | Admitting: Family Medicine

## 2022-03-06 NOTE — Telephone Encounter (Signed)
Left message for patient to call back and schedule Medicare Annual Wellness Visit (AWV) either virtually or in office. Left  my Joseph Gill number [phone]   Last AWV ;03/07/21  please schedule at anytime with [facility] Nurse Health Advisor 1 or 2

## 2022-03-14 ENCOUNTER — Other Ambulatory Visit: Payer: Self-pay

## 2022-03-14 DIAGNOSIS — I483 Typical atrial flutter: Secondary | ICD-10-CM

## 2022-03-14 MED ORDER — APIXABAN 5 MG PO TABS: 5.0000 mg | ORAL_TABLET | Freq: Two times a day (BID) | ORAL | 11 refills | Status: DC

## 2022-03-14 NOTE — Telephone Encounter (Signed)
Prescription refill request for Eliquis received. Indication: Aflutter Last office visit: 12/27/21 Quentin Ore) Scr: 0.93 (09/01/21) Age: 71 Weight: 78.5kg  Appropriate dose and refill sent to requested pharmacy.

## 2022-03-23 ENCOUNTER — Other Ambulatory Visit: Payer: Self-pay | Admitting: Cardiology

## 2022-03-23 ENCOUNTER — Other Ambulatory Visit: Payer: Self-pay | Admitting: Family Medicine

## 2022-03-23 DIAGNOSIS — M1990 Unspecified osteoarthritis, unspecified site: Secondary | ICD-10-CM

## 2022-03-29 DIAGNOSIS — Z23 Encounter for immunization: Secondary | ICD-10-CM | POA: Diagnosis not present

## 2022-04-03 ENCOUNTER — Ambulatory Visit (INDEPENDENT_AMBULATORY_CARE_PROVIDER_SITE_OTHER): Payer: Medicare Other

## 2022-04-03 VITALS — BP 106/60 | HR 66 | Temp 97.8°F | Ht 70.0 in | Wt 175.2 lb

## 2022-04-03 DIAGNOSIS — Z23 Encounter for immunization: Secondary | ICD-10-CM | POA: Diagnosis not present

## 2022-04-03 DIAGNOSIS — Z0000 Encounter for general adult medical examination without abnormal findings: Secondary | ICD-10-CM

## 2022-04-03 NOTE — Progress Notes (Signed)
Subjective:   Joseph Gill is a 71 y.o. male who presents for Medicare Annual/Subsequent preventive examination.  Review of Systems     Cardiac Risk Factors include: advanced age (>39mn, >>12women);dyslipidemia;hypertension     Objective:    Today's Vitals   04/03/22 0915  BP: 106/60  Pulse: 66  Temp: 97.8 F (36.6 C)  TempSrc: Oral  SpO2: 93%  Weight: 175 lb 3.2 oz (79.5 kg)  Height: '5\' 10"'$  (1.778 m)   Body mass index is 25.14 kg/m.     04/03/2022    9:26 AM 09/22/2021    9:16 AM 03/07/2021    1:41 PM 02/20/2021    3:30 PM 07/17/2016   10:16 AM  Advanced Directives  Does Patient Have a Medical Advance Directive? Yes Yes Yes No Yes  Type of AParamedicof [place]owdenLiving will Healthcare Power of ASuperiorin Chart? No - copy requested  No - copy requested  No - copy requested    Current Medications (verified) Outpatient Encounter Medications as of 04/03/2022  Medication Sig   acetaminophen (TYLENOL) 500 MG tablet Take 1,000 mg by mouth every 8 (eight) hours as needed for moderate pain.   amitriptyline (ELAVIL) 25 MG tablet TAKE 1 TABLET BY MOUTH EVERYDAY AT BEDTIME   apixaban (ELIQUIS) 5 MG TABS tablet Take 1 tablet (5 mg total) by mouth 2 (two) times daily.   Ascorbic Acid (VITAMIN C) 1000 MG tablet Take 1,000 mg by mouth every Monday, Tuesday, Wednesday, Thursday, and Friday.   atorvastatin (LIPITOR) 40 MG tablet TAKE 1 TABLET BY MOUTH EVERY DAY   Carboxymethylcellulose Sodium 1 % GEL Place 1 drop into both eyes daily as needed (dry eyes).   diphenhydrAMINE (BENADRYL) 25 MG tablet Take 25 mg by mouth every 6 (six) hours as needed for allergies.   diphenhydramine-acetaminophen (TYLENOL PM) 25-500 MG TABS tablet Take 1 tablet by mouth at bedtime.   JARDIANCE 10 MG TABS tablet TAKE 1 TABLET BY MOUTH DAILY BEFORE BREAKFAST.    loratadine (CLARITIN) 10 MG tablet Take 10 mg by mouth in the morning.   losartan (COZAAR) 25 MG tablet TAKE 1 TABLET (25 MG TOTAL) BY MOUTH DAILY.   metoprolol succinate (TOPROL-XL) 50 MG 24 hr tablet Take 1 tablet (50 mg total) by mouth 2 (two) times daily.   Multiple Vitamin (MULTIVITAMIN WITH MINERALS) TABS tablet Take 1 tablet by mouth in the morning.   pseudoephedrine (SUDAFED) 30 MG tablet Take 30 mg by mouth See admin instructions. Take 1 tablet (30 mg) scheduled by mouth in the morning & may take an additional dose if needed for allergies/sinus issues.   spironolactone (ALDACTONE) 25 MG tablet TAKE 1 TABLET (25 MG TOTAL) BY MOUTH DAILY.   SUMAtriptan (IMITREX) 100 MG tablet TAKE 1 TABLET AT ONSET, MAY REPEAT IN 2 HOURS IF HEADACHE PERSISTS OR RECURS.   traMADol (ULTRAM) 50 MG tablet TAKE 1 TABLET EVERY 12 HOURS AS NEEDED FOR ARTHRITIS PAIN(NEEDS APPT PER DOCTOR)   triamcinolone (NASACORT) 55 MCG/ACT AERO nasal inhaler Place 1 spray into the nose daily.   No facility-administered encounter medications on file as of 04/03/2022.    Allergies (verified) Erythromycin   History: Past Medical History:  Diagnosis Date   ALLERGIC RHINITIS 03/26/2007   Allergy    Back pain    knee and leg pain   Cataract    Chronically dry eyes, bilateral  Complication of anesthesia    Per pt, hard to wake up past certain sedation!   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   Heart murmur    Hyperlipidemia    Irregular heart beat    MIGRAINE HEADACHE 11/03/2008   Past Surgical History:  Procedure Laterality Date   A-FLUTTER ABLATION N/A 09/22/2021   Procedure: A-FLUTTER ABLATION;  Surgeon: Vickie Epley, MD;  Location: [facility];  Service: Cardiovascular;  Laterality: N/A;   CATARACT EXTRACTION, BILATERAL  2015   NASAL SEPTUM SURGERY     NASAL SINUS SURGERY     TONSILLECTOMY     Family History  Problem Relation Age of Onset   Heart disease Mother    Heart disease Father     Lung cancer Maternal Uncle    Heart disease Maternal Grandfather    Prostate cancer Paternal Grandfather    Social History   Socioeconomic History   Marital status: Married    Spouse name: Not on file   Number of children: Not on file   Years of education: Not on file   Highest education level: Not on file  Occupational History   Not on file  Tobacco Use   Smoking status: Never   Smokeless tobacco: Never  Vaping Use   Vaping Use: Never used  Substance and Sexual Activity   Alcohol use: Yes    Alcohol/week: 4.0 standard drinks of alcohol    Types: 1 Glasses of wine, 1 Cans of beer, 1 Shots of liquor, 1 Standard drinks or equivalent per week    Comment: occ   Drug use: No   Sexual activity: Not on file  Other Topics Concern   Not on file  Social History Narrative   Not on file   Social Determinants of Health   Financial Resource Strain: Low Risk  (04/03/2022)   Overall Financial Resource Strain (CARDIA)    Difficulty of Paying Living Expenses: Not hard at all  Food Insecurity: No Food Insecurity (04/03/2022)   Hunger Vital Sign    Worried About Running Out of Food in the Last Year: Never true    Ran Out of Food in the Last Year: Never true  Transportation Needs: No Transportation Needs (04/03/2022)   PRAPARE - Hydrologist (Medical): No    Lack of Transportation (Non-Medical): No  Physical Activity: Inactive (04/03/2022)   Exercise Vital Sign    Days of Exercise per Week: 0 days    Minutes of Exercise per Session: 0 min  Stress: No Stress Concern Present (04/03/2022)   [place]    Feeling of Stress : Only a little  Social Connections: Socially Isolated (03/07/2021)   Social Connection and Isolation Panel [NHANES]    Frequency of Communication with Friends and Family: Once a week    Frequency of Social Gatherings with Friends and Family: Once a week    Attends Religious  Services: Never    Marine scientist or Organizations: No    Attends Music therapist: Never    Marital Status: Married    Tobacco Counseling Counseling given: Not Answered   Clinical Intake:  Pre-visit preparation completed: Yes  Pain : No/denies pain     Nutritional Status: BMI 25 -29 Overweight Nutritional Risks: None Diabetes: No  How often do you need to have someone help you when you read instructions, pamphlets, or other written materials from your doctor or pharmacy?: 1 -  Never What is the last grade level you completed in school?: college  Diabetic? no  Interpreter Needed?: No  Information entered by :: NAllen LPN   Activities of Daily Living    04/03/2022    9:27 AM 03/30/2022   11:22 AM  In your present state of health, do you have any difficulty performing the following activities:  Hearing? 0 0  Vision? 0 1  Difficulty concentrating or making decisions? 0 0  Walking or climbing stairs? 0 0  Dressing or bathing? 0 0  Doing errands, shopping? 0 0  Preparing Food and eating ? N N  Using the Toilet? N N  In the past six months, have you accidently leaked urine? N N  Do you have problems with loss of bowel control? N N  Managing your Medications? N N  Managing your Finances? N N  Housekeeping or managing your Housekeeping? N N    Patient Care Team: Billie Ruddy, MD as PCP - General (Family Medicine) Vickie Epley, MD as PCP - Electrophysiology (Cardiology)  Indicate any recent Medical Services you may have received from other than [facility] providers in the past year (date may be approximate).     Assessment:   This is a routine wellness examination for Yukio.  Hearing/Vision screen Vision Screening - Comments:: Regular eye exams, [facility]  Dietary issues and exercise activities discussed: Current Exercise Habits: The patient does not participate in regular exercise at present   Goals Addressed              This Visit's Progress    Patient Stated       04/03/2022, no goals       Depression Screen    04/03/2022    9:27 AM 03/07/2021    1:40 PM 02/24/2020   10:39 AM 01/26/2014    2:51 PM  PHQ 2/9 Scores  PHQ - 2 Score 0 0 0 0    Fall Risk    04/03/2022    9:27 AM 03/30/2022   11:22 AM 03/07/2021    1:42 PM 02/24/2020   10:39 AM  [place] in the past year? 0 0 0 0  Number falls in past yr: 0 0 0   Injury with Fall? 0 0 0   Risk for fall due to : Medication side effect  Impaired vision   Follow up Falls prevention discussed;Education provided;Falls evaluation completed  Falls prevention discussed     FALL RISK PREVENTION PERTAINING TO THE HOME:  Any stairs in or around the home? Yes  If so, are there any without handrails? Yes  Home free of loose throw rugs in walkways, pet beds, electrical cords, etc? Yes  Adequate lighting in your home to reduce risk of falls? Yes   ASSISTIVE DEVICES UTILIZED TO PREVENT FALLS:  Life alert? No  Use of a cane, walker or w/c? No  Grab bars in the bathroom? Yes  Shower chair or bench in shower? No  Elevated toilet seat or a handicapped toilet? No   TIMED UP AND GO:  Was the test performed? Yes .  Length of time to ambulate 10 feet: 5 sec.   Gait slow and steady without use of assistive device  Cognitive Function:        03/07/2021    1:45 PM  6CIT Screen  What Year? 0 points  What month? 0 points  What time? 0 points  Count back from 20 0 points  Months in reverse  0 points  Repeat phrase 0 points  Total Score 0 points    Immunizations Immunization History  Administered Date(s) Administered   Fluad Quad(high Dose 65+) 03/17/2020, 04/03/2022   Influenza Split 04/27/2011, 03/18/2012   Influenza Whole 04/09/2007, 03/27/2008, 03/29/2009, 03/07/2010   Influenza, High Dose Seasonal PF 03/27/2017, 03/31/2018, 04/03/2019   Influenza, Seasonal, Injecte, Preservative Fre 03/10/2015   Influenza,inj,quad, With Preservative  04/03/2021   Influenza-Unspecified 03/31/2014, 03/15/2016, 03/27/2017   PFIZER(Purple Top)SARS-COV-2 Vaccination 08/01/2019, 08/26/2019, 03/29/2020, 10/18/2020   Pfizer Covid-19 Vaccine Bivalent Booster 10yr & up 03/08/2021   Pneumococcal Conjugate-13 04/24/2017   Pneumococcal Polysaccharide-23 04/24/2018   Td 06/25/1996   Tdap 01/26/2014   Unspecified SARS-COV-2 Vaccination 03/29/2022   Zoster Recombinat (Shingrix) 11/09/2019, 02/12/2020   Zoster, Live 03/25/2012    TDAP status: Up to date  Flu Vaccine status: Completed at today's visit  Pneumococcal vaccine status: Up to date  Covid-19 vaccine status: Completed vaccines  Qualifies for Shingles Vaccine? Yes   Zostavax completed Yes   Shingrix Completed?: Yes  Screening Tests Health Maintenance  Topic Date Due   COLONOSCOPY (Pts 45-460yrInsurance coverage will need to be confirmed)  07/17/2021   TETANUS/TDAP  01/27/2024   Pneumonia Vaccine 6580Years old  Completed   INFLUENZA VACCINE  Completed   COVID-19 Vaccine  Completed   Hepatitis C Screening  Completed   Zoster Vaccines- Shingrix  Completed   HPV VACCINES  Aged Out    Health Maintenance  Health Maintenance Due  Topic Date Due   COLONOSCOPY (Pts [id number] coverage will need to be confirmed)  07/17/2021    Colorectal cancer screening: Type of screening: Colonoscopy. Completed 07/17/2016. Repeat every 5 years  Lung Cancer Screening: (Low Dose CT Chest recommended if Age 61-41-80ars, 30 pack-year currently smoking OR have quit w/in 15years.) does not qualify.   Lung Cancer Screening Referral: no  Additional Screening:  Hepatitis C Screening: does qualify; Completed 03/01/2016  Vision Screening: Recommended annual ophthalmology exams for early detection of glaucoma and other disorders of the eye. Is the patient up to date with their annual eye exam?  Yes  Who is the provider or what is the name of the office in which the patient attends annual eye  exams? [facility] If pt is not established with a provider, would they like to be referred to a provider to establish care? No .   Dental Screening: Recommended annual dental exams for proper oral hygiene  Community Resource Referral / Chronic Care Management: CRR required this visit?  No   CCM required this visit?  No      Plan:     I have personally reviewed and noted the following in the patient's chart:   Medical and social history Use of alcohol, tobacco or illicit drugs  Current medications and supplements including opioid prescriptions. Patient is not currently taking opioid prescriptions. Functional ability and status Nutritional status Physical activity Advanced directives List of other physicians Hospitalizations, surgeries, and ER visits in previous 12 months Vitals Screenings to include cognitive, depression, and falls Referrals and appointments  In addition, I have reviewed and discussed with patient certain preventive protocols, quality metrics, and best practice recommendations. A written personalized care plan for preventive services as well as general preventive health recommendations were provided to patient.     NicKellie SimmeringPN   04/13/34/5974Nurse Notes: 6 CIT not administered. No cognitive deficit noted through direct observation.

## 2022-04-03 NOTE — Patient Instructions (Signed)
[name] , Thank you for taking time to come for your Medicare Wellness Visit. I appreciate your ongoing commitment to your health goals. Please review the following plan we discussed and let me know if I can assist you in the future.   Screening recommendations/referrals: Colonoscopy: completed 07/17/2016, to de determined Recommended yearly ophthalmology/optometry visit for glaucoma screening and checkup Recommended yearly dental visit for hygiene and checkup  Vaccinations: Influenza vaccine: today Pneumococcal vaccine: completed 04/24/2018 Tdap vaccine: completed 01/26/2014, due 01/27/2024 Shingles vaccine: completed   Covid-19:  03/08/2021, 10/18/2020, 03/29/2020, 08/26/2019, 08/01/2019  Advanced directives: Please bring a copy of your POA (Power of Attorney) and/or Living Will to your next appointment.   Conditions/risks identified: none  Next appointment: Follow up in one year for your annual wellness visit.   Preventive Care 15 Years and Older, Male Preventive care refers to lifestyle choices and visits with your health care provider that can promote health and wellness. What does preventive care include? A yearly physical exam. This is also called an annual well check. Dental exams once or twice a year. Routine eye exams. Ask your health care provider how often you should have your eyes checked. Personal lifestyle choices, including: Daily care of your teeth and gums. Regular physical activity. Eating a healthy diet. Avoiding tobacco and drug use. Limiting alcohol use. Practicing safe sex. Taking low doses of aspirin every day. Taking vitamin and mineral supplements as recommended by your health care provider. What happens during an annual well check? The services and screenings done by your health care provider during your annual well check will depend on your age, overall health, lifestyle risk factors, and family history of disease. Counseling  Your health care provider may  ask you questions about your: Alcohol use. Tobacco use. Drug use. Emotional well-being. Home and relationship well-being. Sexual activity. Eating habits. History of falls. Memory and ability to understand (cognition). Work and work Statistician. Screening  You may have the following tests or measurements: Height, weight, and BMI. Blood pressure. Lipid and cholesterol levels. These may be checked every 5 years, or more frequently if you are over 72 years old. Skin check. Lung cancer screening. You may have this screening every year starting at age 10 if you have a 30-pack-year history of smoking and currently smoke or have quit within the past 15 years. Fecal occult blood test (FOBT) of the stool. You may have this test every year starting at age 57. Flexible sigmoidoscopy or colonoscopy. You may have a sigmoidoscopy every 5 years or a colonoscopy every 10 years starting at age 58. Prostate cancer screening. Recommendations will vary depending on your family history and other risks. Hepatitis C blood test. Hepatitis B blood test. Sexually transmitted disease (STD) testing. Diabetes screening. This is done by checking your blood sugar (glucose) after you have not eaten for a while (fasting). You may have this done every 1-3 years. Abdominal aortic aneurysm (AAA) screening. You may need this if you are a current or former smoker. Osteoporosis. You may be screened starting at age 62 if you are at high risk. Talk with your health care provider about your test results, treatment options, and if necessary, the need for more tests. Vaccines  Your health care provider may recommend certain vaccines, such as: Influenza vaccine. This is recommended every year. Tetanus, diphtheria, and acellular pertussis (Tdap, Td) vaccine. You may need a Td booster every 10 years. Zoster vaccine. You may need this after age 60. Pneumococcal 13-valent conjugate (PCV13) vaccine.  One dose is recommended after age  54. Pneumococcal polysaccharide (PPSV23) vaccine. One dose is recommended after age 80. Talk to your health care provider about which screenings and vaccines you need and how often you need them. This information is not intended to replace advice given to you by your health care provider. Make sure you discuss any questions you have with your health care provider. Document Released: 07/08/2015 Document Revised: 02/29/2016 Document Reviewed: 04/12/2015 Elsevier Interactive Patient Education  2017 [place] Prevention in the Home Falls can cause injuries. They can happen to people of all ages. There are many things you can do to make your home safe and to help prevent falls. What can I do on the outside of my home? Regularly fix the edges of walkways and driveways and fix any cracks. Remove anything that might make you trip as you walk through a door, such as a raised step or threshold. Trim any bushes or trees on the path to your home. Use bright outdoor lighting. Clear any walking paths of anything that might make someone trip, such as rocks or tools. Regularly check to see if handrails are loose or broken. Make sure that both sides of any steps have handrails. Any raised decks and porches should have guardrails on the edges. Have any leaves, snow, or ice cleared regularly. Use sand or salt on walking paths during winter. Clean up any spills in your garage right away. This includes oil or grease spills. What can I do in the bathroom? Use night lights. Install grab bars by the toilet and in the tub and shower. Do not use towel bars as grab bars. Use non-skid mats or decals in the tub or shower. If you need to sit down in the shower, use a plastic, non-slip stool. Keep the floor dry. Clean up any water that spills on the floor as soon as it happens. Remove soap buildup in the tub or shower regularly. Attach bath mats securely with double-sided non-slip rug tape. Do not have throw  rugs and other things on the floor that can make you trip. What can I do in the bedroom? Use night lights. Make sure that you have a light by your bed that is easy to reach. Do not use any sheets or blankets that are too big for your bed. They should not hang down onto the floor. Have a firm chair that has side arms. You can use this for support while you get dressed. Do not have throw rugs and other things on the floor that can make you trip. What can I do in the kitchen? Clean up any spills right away. Avoid walking on wet floors. Keep items that you use a lot in easy-to-reach places. If you need to reach something above you, use a strong step stool that has a grab bar. Keep electrical cords out of the way. Do not use floor polish or wax that makes floors slippery. If you must use wax, use non-skid floor wax. Do not have throw rugs and other things on the floor that can make you trip. What can I do with my stairs? Do not leave any items on the stairs. Make sure that there are handrails on both sides of the stairs and use them. Fix handrails that are broken or loose. Make sure that handrails are as long as the stairways. Check any carpeting to make sure that it is firmly attached to the stairs. Fix any carpet that is loose or  worn. Avoid having throw rugs at the top or bottom of the stairs. If you do have throw rugs, attach them to the floor with carpet tape. Make sure that you have a light switch at the top of the stairs and the bottom of the stairs. If you do not have them, ask someone to add them for you. What else can I do to help prevent falls? Wear shoes that: Do not have high heels. Have rubber bottoms. Are comfortable and fit you well. Are closed at the toe. Do not wear sandals. If you use a stepladder: Make sure that it is fully opened. Do not climb a closed stepladder. Make sure that both sides of the stepladder are locked into place. Ask someone to hold it for you, if  possible. Clearly mark and make sure that you can see: Any grab bars or handrails. First and last steps. Where the edge of each step is. Use tools that help you move around (mobility aids) if they are needed. These include: Canes. Walkers. Scooters. Crutches. Turn on the lights when you go into a dark area. Replace any light bulbs as soon as they burn out. Set up your furniture so you have a clear path. Avoid moving your furniture around. If any of your floors are uneven, fix them. If there are any pets around you, be aware of where they are. Review your medicines with your doctor. Some medicines can make you feel dizzy. This can increase your chance of falling. Ask your doctor what other things that you can do to help prevent falls. This information is not intended to replace advice given to you by your health care provider. Make sure you discuss any questions you have with your health care provider. Document Released: 04/07/2009 Document Revised: 11/17/2015 Document Reviewed: 07/16/2014 Elsevier Interactive Patient Education  2017 [employer].

## 2022-04-05 ENCOUNTER — Encounter: Payer: Self-pay | Admitting: Family Medicine

## 2022-04-05 ENCOUNTER — Ambulatory Visit (INDEPENDENT_AMBULATORY_CARE_PROVIDER_SITE_OTHER): Payer: Medicare Other

## 2022-04-05 ENCOUNTER — Ambulatory Visit (INDEPENDENT_AMBULATORY_CARE_PROVIDER_SITE_OTHER): Payer: Medicare Other | Admitting: Family Medicine

## 2022-04-05 VITALS — BP 108/70 | HR 74 | Temp 97.7°F | Wt 173.8 lb

## 2022-04-05 DIAGNOSIS — M1712 Unilateral primary osteoarthritis, left knee: Secondary | ICD-10-CM | POA: Diagnosis not present

## 2022-04-05 DIAGNOSIS — M1711 Unilateral primary osteoarthritis, right knee: Secondary | ICD-10-CM | POA: Diagnosis not present

## 2022-04-05 DIAGNOSIS — M25561 Pain in right knee: Secondary | ICD-10-CM

## 2022-04-05 DIAGNOSIS — G8929 Other chronic pain: Secondary | ICD-10-CM

## 2022-04-05 DIAGNOSIS — Z9189 Other specified personal risk factors, not elsewhere classified: Secondary | ICD-10-CM | POA: Diagnosis not present

## 2022-04-05 DIAGNOSIS — M25562 Pain in left knee: Secondary | ICD-10-CM | POA: Diagnosis not present

## 2022-04-05 NOTE — Progress Notes (Signed)
Subjective:    Patient ID: Joseph Gill, male    DOB: Jun 04, 1951, 71 y.o.   MRN: [id number]  Chief Complaint  Patient presents with   testosterone    Questions-years ago it was low, but recently states it would not be filled, wants to continue taking it.   Pain    Knee pain several hours after exercising.ice packs not helping. Pain going on over a year.     HPI Patient was seen today for ongoing concerns.  Patient inquires why prescription for tramadol was not refilled.  Patient advised he has not been seen by this provider in over a year.  Patient states at times he has no motivation to get outside and do things.  He may need to restart testosterone.  Patient endorses bilateral knee pain that wakes him up at night.  States the whole knee hurts.  Denies swelling and edema.  Was taking tramadol at night prior to bed but it did not help symptoms.  Patient denies symptoms during the day or when going upstairs.  Also tried ice for symptoms.   Past Medical History:  Diagnosis Date   ALLERGIC RHINITIS 03/26/2007   Allergy    Back pain    knee and leg pain   Cataract    Chronically dry eyes, bilateral    Complication of anesthesia    Per pt, hard to wake up past certain sedation!   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    ROS General: Denies fever, chills, night sweats, changes in weight, changes in appetite HEENT: Denies headaches, ear pain, changes in vision, rhinorrhea, sore throat CV: Denies CP, palpitations, SOB, orthopnea Pulm: Denies SOB, cough, wheezing GI: Denies abdominal pain, nausea, vomiting, diarrhea, constipation GU: Denies dysuria, hematuria, frequency Msk: Denies muscle cramps, joint pains  + b/l knee pain at night Neuro: Denies weakness, numbness, tingling   Skin: Denies rashes, bruising Psych:  Denies depression, anxiety, hallucinations  +decreased motivation    Objective:    Blood pressure 108/70, pulse 74, temperature 97.7 F (36.5 C), temperature source Oral, weight 173 lb 12.8 oz (78.8 kg), SpO2 95 %.  Gen. Pleasant, well-nourished, in no distress, normal affect   HEENT: [place]/AT, face symmetric, conjunctiva clear, no scleral icterus, PERRLA, EOMI, nares patent without drainage Lungs: no accessory muscle use Cardiovascular: RRR, no peripheral edema Musculoskeletal: No edema, erythema, no crepitus of bilateral knees, no TTP of joint line bilateral knees.  No calf tenderness.  No deformities, no cyanosis or clubbing, normal tone Neuro:  A&Ox3, CN II-XII intact, normal gait Skin:  Warm, no lesions/ rash  Wt Readings from Last 3 Encounters:  04/05/22 173 lb 12.8 oz (78.8 kg)  04/03/22 175 lb 3.2 oz (79.5 kg)  12/27/21 173 lb (78.5 kg)    Lab Results  Component Value Date   WBC 6.6 11/02/2021   HGB 14.6 11/02/2021   HCT 43.3 11/02/2021   PLT 271 11/02/2021   GLUCOSE 94 09/01/2021   CHOL 161 02/26/2020   TRIG 146 02/26/2020   HDL 53 02/26/2020   LDLDIRECT 82.0 04/02/2019   LDLCALC 83 02/26/2020   ALT 24 02/20/2021   AST 28 02/20/2021   NA 139 09/01/2021   K 4.5 09/01/2021   CL 101 09/01/2021   CREATININE 0.93 09/01/2021   BUN 14  09/01/2021   CO2 24 09/01/2021   TSH 2.14 02/26/2020   PSA 1.36 07/02/2016      04/03/2022    9:27 AM 03/07/2021    1:40 PM 02/24/2020   10:39 AM  Depression screen PHQ 2/9  Decreased Interest 0 0 0  Down, Depressed, Hopeless 0 0 0  PHQ - 2 Score 0 0 0    Assessment/Plan:  Chronic pain of both knees -discussed possible causes including arthritis.   -will obtain imaging -supportive care with OTC voltaren gel, heat, etc. -Further recommendations such as Ortho referral based on imaging.  - Plan: DG Knee Complete 4 Views Right, DG Knee Complete 4 Views Left  Lack of motivation -Patient advised various things can cause lack of  motivation including depression, vitamin/electrolyte deficiency, medications -PHQ-9 score 0 this visit -Patient encouraged to exercise and start self-care  F/u as needed  Grier Mitts, MD

## 2022-04-09 ENCOUNTER — Other Ambulatory Visit: Payer: Self-pay | Admitting: Cardiology

## 2022-04-09 ENCOUNTER — Other Ambulatory Visit: Payer: Self-pay | Admitting: Family Medicine

## 2022-04-09 DIAGNOSIS — M1990 Unspecified osteoarthritis, unspecified site: Secondary | ICD-10-CM

## 2022-04-13 ENCOUNTER — Other Ambulatory Visit: Payer: Self-pay | Admitting: Cardiology

## 2022-04-27 DIAGNOSIS — M25561 Pain in right knee: Secondary | ICD-10-CM | POA: Diagnosis not present

## 2022-04-27 DIAGNOSIS — M25562 Pain in left knee: Secondary | ICD-10-CM | POA: Diagnosis not present

## 2022-04-30 ENCOUNTER — Other Ambulatory Visit: Payer: Self-pay | Admitting: Family Medicine

## 2022-04-30 DIAGNOSIS — E785 Hyperlipidemia, unspecified: Secondary | ICD-10-CM

## 2022-04-30 DIAGNOSIS — M222X2 Patellofemoral disorders, left knee: Secondary | ICD-10-CM | POA: Diagnosis not present

## 2022-04-30 DIAGNOSIS — M222X1 Patellofemoral disorders, right knee: Secondary | ICD-10-CM | POA: Diagnosis not present

## 2022-05-02 DIAGNOSIS — M222X2 Patellofemoral disorders, left knee: Secondary | ICD-10-CM | POA: Diagnosis not present

## 2022-05-02 DIAGNOSIS — M222X1 Patellofemoral disorders, right knee: Secondary | ICD-10-CM | POA: Diagnosis not present

## 2022-05-08 DIAGNOSIS — M222X2 Patellofemoral disorders, left knee: Secondary | ICD-10-CM | POA: Diagnosis not present

## 2022-05-08 DIAGNOSIS — M222X1 Patellofemoral disorders, right knee: Secondary | ICD-10-CM | POA: Diagnosis not present

## 2022-05-11 DIAGNOSIS — M222X1 Patellofemoral disorders, right knee: Secondary | ICD-10-CM | POA: Diagnosis not present

## 2022-05-11 DIAGNOSIS — M222X2 Patellofemoral disorders, left knee: Secondary | ICD-10-CM | POA: Diagnosis not present

## 2022-05-16 DIAGNOSIS — M222X1 Patellofemoral disorders, right knee: Secondary | ICD-10-CM | POA: Diagnosis not present

## 2022-05-16 DIAGNOSIS — M222X2 Patellofemoral disorders, left knee: Secondary | ICD-10-CM | POA: Diagnosis not present

## 2022-05-22 DIAGNOSIS — M222X2 Patellofemoral disorders, left knee: Secondary | ICD-10-CM | POA: Diagnosis not present

## 2022-05-22 DIAGNOSIS — M222X1 Patellofemoral disorders, right knee: Secondary | ICD-10-CM | POA: Diagnosis not present

## 2022-05-24 DIAGNOSIS — M222X2 Patellofemoral disorders, left knee: Secondary | ICD-10-CM | POA: Diagnosis not present

## 2022-05-24 DIAGNOSIS — M222X1 Patellofemoral disorders, right knee: Secondary | ICD-10-CM | POA: Diagnosis not present

## 2022-05-29 ENCOUNTER — Other Ambulatory Visit: Payer: Self-pay | Admitting: Family Medicine

## 2022-05-29 DIAGNOSIS — G43809 Other migraine, not intractable, without status migrainosus: Secondary | ICD-10-CM

## 2022-05-30 DIAGNOSIS — M222X2 Patellofemoral disorders, left knee: Secondary | ICD-10-CM | POA: Diagnosis not present

## 2022-05-30 DIAGNOSIS — M222X1 Patellofemoral disorders, right knee: Secondary | ICD-10-CM | POA: Diagnosis not present

## 2022-06-05 DIAGNOSIS — M222X1 Patellofemoral disorders, right knee: Secondary | ICD-10-CM | POA: Diagnosis not present

## 2022-06-05 DIAGNOSIS — M222X2 Patellofemoral disorders, left knee: Secondary | ICD-10-CM | POA: Diagnosis not present

## 2022-06-07 DIAGNOSIS — M222X1 Patellofemoral disorders, right knee: Secondary | ICD-10-CM | POA: Diagnosis not present

## 2022-06-07 DIAGNOSIS — M222X2 Patellofemoral disorders, left knee: Secondary | ICD-10-CM | POA: Diagnosis not present

## 2022-06-08 DIAGNOSIS — M25561 Pain in right knee: Secondary | ICD-10-CM | POA: Diagnosis not present

## 2022-06-14 DIAGNOSIS — M222X2 Patellofemoral disorders, left knee: Secondary | ICD-10-CM | POA: Diagnosis not present

## 2022-06-14 DIAGNOSIS — M222X1 Patellofemoral disorders, right knee: Secondary | ICD-10-CM | POA: Diagnosis not present

## 2022-06-19 DIAGNOSIS — M222X2 Patellofemoral disorders, left knee: Secondary | ICD-10-CM | POA: Diagnosis not present

## 2022-06-19 DIAGNOSIS — M222X1 Patellofemoral disorders, right knee: Secondary | ICD-10-CM | POA: Diagnosis not present

## 2022-06-21 DIAGNOSIS — M222X1 Patellofemoral disorders, right knee: Secondary | ICD-10-CM | POA: Diagnosis not present

## 2022-06-21 DIAGNOSIS — M222X2 Patellofemoral disorders, left knee: Secondary | ICD-10-CM | POA: Diagnosis not present

## 2022-07-05 ENCOUNTER — Other Ambulatory Visit: Payer: Self-pay | Admitting: Family Medicine

## 2022-07-05 DIAGNOSIS — M1990 Unspecified osteoarthritis, unspecified site: Secondary | ICD-10-CM

## 2022-08-06 ENCOUNTER — Other Ambulatory Visit: Payer: Self-pay | Admitting: Family Medicine

## 2022-08-06 DIAGNOSIS — E785 Hyperlipidemia, unspecified: Secondary | ICD-10-CM

## 2022-09-03 ENCOUNTER — Ambulatory Visit (INDEPENDENT_AMBULATORY_CARE_PROVIDER_SITE_OTHER): Payer: Medicare Other | Admitting: Family Medicine

## 2022-09-03 ENCOUNTER — Encounter: Payer: Self-pay | Admitting: Family Medicine

## 2022-09-03 VITALS — BP 120/60 | HR 64 | Temp 98.2°F | Ht 70.0 in | Wt 175.0 lb

## 2022-09-03 DIAGNOSIS — H00015 Hordeolum externum left lower eyelid: Secondary | ICD-10-CM

## 2022-09-03 MED ORDER — BACITRACIN-POLYMYXIN B 500-10000 UNIT/GM OP OINT: 1.0000 | TOPICAL_OINTMENT | Freq: Two times a day (BID) | OPHTHALMIC | 0 refills | Status: AC

## 2022-09-03 NOTE — Progress Notes (Signed)
Subjective:     Patient ID: Joseph Gill, male    DOB: 1950/07/29, 72 y.o.   MRN: [id number]  Chief Complaint  Patient presents with   Conjunctivitis    Pink eye, left eye noticed on Thursday, denies any pain or drainage    HPI 1  since 3/7-pink eye on L.  No pain.  No d/c,+ some blurry vision changes. No runny nose/congestion.  Has dry eyes.  "Big thing" under eye.  .  No soaks  Health Maintenance Due  Topic Date Due   COLONOSCOPY (Pts 45-38yr Insurance coverage will need to be confirmed)  07/17/2021    Past Medical History:  Diagnosis Date   ALLERGIC RHINITIS 03/26/2007   Allergy    Back pain    knee and leg pain   Cataract    Chronically dry eyes, bilateral    Complication of anesthesia    Per pt, hard to wake up past certain sedation!   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   Heart murmur    Hyperlipidemia    Irregular heart beat    MIGRAINE HEADACHE 11/03/2008    Past Surgical History:  Procedure Laterality Date   A-FLUTTER ABLATION N/A 09/22/2021   Procedure: A-FLUTTER ABLATION;  Surgeon: LVickie Epley MD;  [facility];  Service: Cardiovascular;  Laterality: N/A;   CATARACT EXTRACTION, BILATERAL  2015   NASAL SEPTUM SURGERY     NASAL SINUS SURGERY     TONSILLECTOMY      Outpatient Medications Prior to Visit  Medication Sig Dispense Refill   acetaminophen (TYLENOL) 500 MG tablet Take 1,000 mg by mouth every 8 (eight) hours as needed for moderate pain.     amitriptyline (ELAVIL) 25 MG tablet TAKE 1 TABLET BY MOUTH EVERYDAY AT BEDTIME 90 tablet 0   apixaban (ELIQUIS) 5 MG TABS tablet Take 1 tablet (5 mg total) by mouth 2 (two) times daily. 60 tablet 11   Ascorbic Acid (VITAMIN C) 1000 MG tablet Take 1,000 mg by mouth every Monday, Tuesday, Wednesday, Thursday, and Friday.     atorvastatin (LIPITOR) 40 MG tablet TAKE 1 TABLET BY MOUTH EVERY DAY 90 tablet 1   Carboxymethylcellulose Sodium 1 % GEL Place 1 drop into both eyes daily as  needed (dry eyes).     diphenhydrAMINE (BENADRYL) 25 MG tablet Take 25 mg by mouth every 6 (six) hours as needed for allergies.     diphenhydramine-acetaminophen (TYLENOL PM) 25-500 MG TABS tablet Take 1 tablet by mouth at bedtime.     JARDIANCE 10 MG TABS tablet TAKE 1 TABLET BY MOUTH DAILY BEFORE BREAKFAST. 30 tablet 11   loratadine (CLARITIN) 10 MG tablet Take 10 mg by mouth in the morning.     losartan (COZAAR) 25 MG tablet TAKE 1 TABLET (25 MG TOTAL) BY MOUTH DAILY. 90 tablet 3   metoprolol succinate (TOPROL-XL) 25 MG 24 hr tablet TAKE 1 TABLET BY MOUTH TWICE A DAY 180 tablet 1   metoprolol succinate (TOPROL-XL) 50 MG 24 hr tablet Take 1 tablet (50 mg total) by mouth 2 (two) times daily. 180 tablet 3   Multiple Vitamin (MULTIVITAMIN WITH MINERALS) TABS tablet Take 1 tablet by mouth in the morning.     pseudoephedrine (SUDAFED) 30 MG tablet Take 30 mg by mouth See admin instructions. Take 1 tablet (30 mg) scheduled by mouth in the morning & may take an additional dose if needed for allergies/sinus issues.     spironolactone (ALDACTONE) 25 MG tablet TAKE  1 TABLET (25 MG TOTAL) BY MOUTH DAILY. 90 tablet 3   SUMAtriptan (IMITREX) 100 MG tablet TAKE 1 TABLET AT ONSET, MAY REPEAT IN 2 HOURS IF HEADACHE PERSISTS OR RECURS. 10 tablet 11   traMADol (ULTRAM) 50 MG tablet TAKE 1 TABLET EVERY 12 HOURS AS NEEDED FOR ARTHRITIS PAIN 30 tablet 1   triamcinolone (NASACORT) 55 MCG/ACT AERO nasal inhaler Place 1 spray into the nose daily.     No facility-administered medications prior to visit.    Allergies  Allergen Reactions   Erythromycin     Feels terrible!   ROS neg/noncontributory except as noted HPI/below      Objective:     BP 120/60   Pulse 64   Temp 98.2 F (36.8 C) (Temporal)   Ht '5\' 10"'$  (1.778 m)   Wt 175 lb (79.4 kg)   SpO2 99%   BMI 25.11 kg/m  Wt Readings from Last 3 Encounters:  09/03/22 175 lb (79.4 kg)  04/05/22 173 lb 12.8 oz (78.8 kg)  04/03/22 175 lb 3.2 oz (79.5 kg)     Physical Exam   Gen: WDWN NAD HEENT: NCAT, conjunctiva not injected, sclera nonicteric.  L - under lower lid, some swelling and redness and poss stye.  No vessicles, no pain.   EXT:  no edema MSK: no gross abnormalities.  NEURO: A&O x3.  CN II-XII intact.  PSYCH: normal mood. Good eye contact     Assessment & Plan:   Problem List Items Addressed This Visit   None Visit Diagnoses     Hordeolum externum of left lower eyelid    -  Primary      Stye vs irrit l lower eyelid.  Doubt shingles. Soaks,  polysporin as some local irrit/infection.  Worse, no change f/u or f/u ophth  Meds ordered this encounter  Medications   bacitracin-polymyxin b (POLYSPORIN) ophthalmic ointment    Sig: Place 1 Application into the left eye 2 (two) times daily. apply to eye every 12 hours while awake    Dispense:  3.5 g    Refill:  0    Wellington Hampshire, MD

## 2022-09-03 NOTE — Patient Instructions (Signed)
Soaks twice/day, ointment twice daily  See eye doc if worse/no change or [place].

## 2022-09-05 ENCOUNTER — Other Ambulatory Visit: Payer: Self-pay | Admitting: Family Medicine

## 2022-09-05 DIAGNOSIS — M1990 Unspecified osteoarthritis, unspecified site: Secondary | ICD-10-CM

## 2022-09-28 ENCOUNTER — Other Ambulatory Visit: Payer: Self-pay | Admitting: Cardiology

## 2022-11-28 ENCOUNTER — Other Ambulatory Visit: Payer: Self-pay | Admitting: Family Medicine

## 2022-11-28 DIAGNOSIS — M1990 Unspecified osteoarthritis, unspecified site: Secondary | ICD-10-CM

## 2022-11-29 ENCOUNTER — Encounter: Payer: Self-pay | Admitting: Family Medicine

## 2022-11-29 ENCOUNTER — Other Ambulatory Visit: Payer: Self-pay | Admitting: Cardiology

## 2022-11-29 DIAGNOSIS — M1990 Unspecified osteoarthritis, unspecified site: Secondary | ICD-10-CM | POA: Insufficient documentation

## 2023-01-07 ENCOUNTER — Other Ambulatory Visit: Payer: Self-pay | Admitting: Cardiology

## 2023-01-20 ENCOUNTER — Other Ambulatory Visit: Payer: Self-pay | Admitting: Family Medicine

## 2023-01-20 DIAGNOSIS — G43809 Other migraine, not intractable, without status migrainosus: Secondary | ICD-10-CM

## 2023-01-23 DIAGNOSIS — B078 Other viral warts: Secondary | ICD-10-CM | POA: Diagnosis not present

## 2023-01-23 DIAGNOSIS — D239 Other benign neoplasm of skin, unspecified: Secondary | ICD-10-CM | POA: Diagnosis not present

## 2023-02-02 ENCOUNTER — Other Ambulatory Visit: Payer: Self-pay | Admitting: Cardiology

## 2023-02-03 NOTE — Progress Notes (Unsigned)
Cardiology Office Note Date:  02/05/2023  Patient ID:  Joseph Gill, Joseph Gill 03/18/1951, MRN [id number] PCP:  Deeann Saint, MD Electrophysiologist: Dr. Lalla Brothers    Chief Complaint:  annual visit  History of Present Illness: Joseph Gill is a 72 y.o. male with history of AFlutter (ablated), DCM felt 2/2 tachycardia > improved, HLD  He saw Dr. Lalla Brothers 12/27/21, doing well, EF had improved (to 48% by c.MRI), no symptoms of AFlutter, discussed perhaps stopping OAC at his next visit with consideration of loop implant  TODAY Cardiac-wise, in the last year had a single episode of CP occurred at night just came and went, non otherwise. No rest SOB or difficulties with ADLs but gets winded with heavier activities like yard work, this is ongoing for a couple years, not worse, but was hoping would improve after the ablation.  No near syncope or syncope. No bleeding or signs of bleeding  He does have concerns that he may be having medication reaction Says when he has his arm elevated even for a fairly brief perios his hands go numb and are painful. This is not new, but definitely worse and takes longer to go away. B/l plantar feet sensitivity. Bottom of his feet hurt. And generally feel "different" then they used to  No palpitations, though reports he was unaware of his Aflutter/rhythm before    Aflutter/AAD hx CTI ablation 09/22/21   Past Medical History:  Diagnosis Date   ALLERGIC RHINITIS 03/26/2007   Allergy    Back pain    knee and leg pain   Cataract    Chronically dry eyes, bilateral    Complication of anesthesia    Per pt, hard to wake up past certain sedation!   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   Heart murmur    Hyperlipidemia    Irregular heart beat    MIGRAINE HEADACHE 11/03/2008    Past Surgical History:  Procedure Laterality Date   A-FLUTTER ABLATION N/A 09/22/2021   Procedure: A-FLUTTER ABLATION;  Surgeon: Lanier Prude, MD;  Location: [facility];  Service: Cardiovascular;  Laterality: N/A;   CATARACT EXTRACTION, BILATERAL  2015   NASAL SEPTUM SURGERY     NASAL SINUS SURGERY     TONSILLECTOMY      Current Outpatient Medications  Medication Sig Dispense Refill   acetaminophen (TYLENOL) 500 MG tablet Take 1,000 mg by mouth every 8 (eight) hours as needed for moderate pain.     apixaban (ELIQUIS) 5 MG TABS tablet Take 1 tablet (5 mg total) by mouth 2 (two) times daily. 60 tablet 11   Ascorbic Acid (VITAMIN C) 1000 MG tablet Take 1,000 mg by mouth every Monday, Tuesday, Wednesday, Thursday, and Friday.     atorvastatin (LIPITOR) 40 MG tablet TAKE 1 TABLET BY MOUTH EVERY DAY 90 tablet 1   bacitracin-polymyxin b (POLYSPORIN) ophthalmic ointment Place 1 Application into the left eye 2 (two) times daily. apply to eye every 12 hours while awake 3.5 g 0   Carboxymethylcellulose Sodium 1 % GEL Place 1 drop into both eyes daily as needed (dry eyes).     diphenhydrAMINE (BENADRYL) 25 MG tablet Take 25 mg by mouth every 6 (six) hours as needed for allergies.     diphenhydramine-acetaminophen (TYLENOL PM) 25-500 MG TABS tablet Take 1 tablet by mouth at bedtime.     empagliflozin (JARDIANCE) 10 MG TABS tablet TAKE 1 TABLET BY MOUTH EVERY DAY BEFORE BREAKFAST 90 tablet 0   loratadine (  CLARITIN) 10 MG tablet Take 10 mg by mouth in the morning.     losartan (COZAAR) 25 MG tablet TAKE 1 TABLET (25 MG TOTAL) BY MOUTH DAILY. 30 tablet 0   metoprolol succinate (TOPROL-XL) 50 MG 24 hr tablet Take 1 tablet (50 mg total) by mouth 2 (two) times daily. 180 tablet 0   Multiple Vitamin (MULTIVITAMIN WITH MINERALS) TABS tablet Take 1 tablet by mouth in the morning.     pseudoephedrine (SUDAFED) 30 MG tablet Take 30 mg by mouth See admin instructions. Take 1 tablet (30 mg) scheduled by mouth in the morning & may take an additional dose if needed for allergies/sinus issues.     spironolactone (ALDACTONE) 25 MG tablet TAKE 1 TABLET (25 MG TOTAL) BY  MOUTH DAILY. 90 tablet 0   SUMAtriptan (IMITREX) 100 MG tablet TAKE 1 TABLET AT ONSET, MAY REPEAT IN 2 HOURS IF HEADACHE PERSISTS OR RECURS. 9 tablet 12   traMADol (ULTRAM) 50 MG tablet TAKE 1 TABLET BY MOUTH EVERY 12 HOURS AS NEEDED FOR ARTHRITIS PAIN 30 tablet 1   triamcinolone (NASACORT) 55 MCG/ACT AERO nasal inhaler Place 1 spray into the nose daily.     amitriptyline (ELAVIL) 25 MG tablet TAKE 1 TABLET BY MOUTH EVERYDAY AT BEDTIME 90 tablet 0   metoprolol succinate (TOPROL-XL) 25 MG 24 hr tablet TAKE 1 TABLET BY MOUTH TWICE A DAY 180 tablet 1   No current facility-administered medications for this visit.    Allergies:   Erythromycin   Social History:  The patient  reports that he has never smoked. He has never used smokeless tobacco. He reports current alcohol use of about 4.0 standard drinks of alcohol per week. He reports that he does not use drugs.   Family History:  The patient's family history includes Heart disease in his father, maternal grandfather, and mother; Lung cancer in his maternal uncle; Prostate cancer in his paternal grandfather.  ROS:  Please see the history of present illness.    All other systems are reviewed and otherwise negative.   PHYSICAL EXAM:  VS:  BP 120/84 (BP Location: Right Arm, Patient Position: Sitting, Cuff Size: Large)   Pulse (!) 57   Ht 5\' 10"  (1.778 m)   Wt 173 lb 9.6 oz (78.7 kg)   SpO2 97%   BMI 24.91 kg/m  BMI: Body mass index is 24.91 kg/m. Well nourished, well developed, in no acute distress HEENT: normocephalic, atraumatic Neck: no JVD, carotid bruits or masses Cardiac:  RRR; no significant murmurs, no rubs, or gallops Lungs:  CTA b/l, no wheezing, rhonchi or rales Abd: soft, nontender MS: no deformity or atrophy Ext: no edema Skin: warm and dry, no rash Neuro:  No gross deficits appreciated Psych: euthymic mood, full affect  Excellent and equal radial pulses remained with elevation of his arms as well with the reports of the  beginning of them starting to feel numb/tingling Also excellent and equal DP and PT pulses feet   EKG:  Done today and reviewed by myself shows  SB 57bpm, 1st degree AVBlock, RBBB, LAD  12/20/21: c.MRI IMPRESSION: 1. Normal LV size, mild hypertrophy, and mild systolic dysfunction (EF 48%) 2.  Normal RV size with mild systolic dysfunction (EF 46%) 3.  No late gadolinium enhancement to suggest myocardial scar   09/22/21: EPS/ablation CONCLUSIONS:   1. Typical atrial flutter   2. Successful radiofrequency ablation of atrial flutter along the cavotricuspid isthmus with complete bidirectional isthmus block achieved.   3. No inducible  arrhythmias following ablation.   4. No early apparent complications.    04/14/21: stress myoview Findings are consistent with no ischemia. The study is low risk.   No ST deviation was noted.   LV perfusion is normal. There is no evidence of ischemia. There is no evidence of infarction.   Left ventricular function is abnormal. Global function is moderately reduced. Nuclear stress EF: 43 %. The left ventricular ejection fraction is moderately decreased (30-44%). End diastolic cavity size is normal.   Prior study not available for comparison.   Low risk stress nuclear study with normal perfusion pattern. Gated images suggest reduced left ventricular global systolic function. Calculations can be erroneous with atrial fibrillation; recommend correlation with echocardiography.   02/23/21: TTE 1. Left ventricular ejection fraction, by estimation, is 30 to 35%. The  left ventricle has moderately decreased function. The left ventricle  demonstrates global hypokinesis. There is mild left ventricular  hypertrophy. Left ventricular diastolic  parameters are indeterminate.   2. Right ventricular systolic function is moderately reduced. The right  ventricular size is normal. There is normal pulmonary artery systolic  pressure. The estimated right ventricular systolic  pressure is 21.8 mmHg.   3. The mitral valve is grossly normal. Mild mitral valve regurgitation.  No evidence of mitral stenosis.   4. The aortic valve is grossly normal. Aortic valve regurgitation is not  visualized. No aortic stenosis is present.   5. The inferior vena cava is normal in size with greater than 50%  respiratory variability, suggesting right atrial pressure of 3 mmHg.   Conclusion(s)/Recommendation(s): Paroxysmal afib during study. When in  sinus, EF appears slightly better than when in Afib (EF in afib 25%).    Recent Labs: No results found for requested labs within last 365 days.  No results found for requested labs within last 365 days.   CrCl cannot be calculated (Patient's most recent lab result is older than the maximum 21 days allowed.).   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 For now, no changes  DCM Chronic CHF EF improved to 48% by c.MRI 2023 DOE with heavier activities Update his echo   Secondary hypercoagulable state  5. Hands/feet complaints as described above Low suspicion of vascular etiology though will check  LE ABI and [place] as well as b/l UE arterial US Advised he d/w his PMD, consider labs evaluation into other neuropathy etiologies I doubt his meds No longer on amitriptyline   Disposition: F/u with [place] in 6 mo  Current medicines are reviewed at length with the patient today.  The patient did not have any concerns regarding medicines.  Norma Fredrickson, PA-C 02/05/2023 8:39 AM      [facility] [address] [place] [address] [phone] (office)  [phone] (fax)

## 2023-02-04 ENCOUNTER — Other Ambulatory Visit: Payer: Self-pay | Admitting: Cardiology

## 2023-02-05 ENCOUNTER — Encounter: Payer: Self-pay | Admitting: Physician Assistant

## 2023-02-05 ENCOUNTER — Ambulatory Visit: Payer: Medicare Other | Attending: Physician Assistant | Admitting: Physician Assistant

## 2023-02-05 VITALS — BP 120/84 | HR 57 | Ht 70.0 in | Wt 173.6 lb

## 2023-02-05 DIAGNOSIS — M79605 Pain in left leg: Secondary | ICD-10-CM | POA: Diagnosis not present

## 2023-02-05 DIAGNOSIS — Z79899 Other long term (current) drug therapy: Secondary | ICD-10-CM | POA: Insufficient documentation

## 2023-02-05 DIAGNOSIS — G458 Other transient cerebral ischemic attacks and related syndromes: Secondary | ICD-10-CM | POA: Diagnosis not present

## 2023-02-05 DIAGNOSIS — I428 Other cardiomyopathies: Secondary | ICD-10-CM | POA: Diagnosis not present

## 2023-02-05 DIAGNOSIS — R202 Paresthesia of skin: Secondary | ICD-10-CM | POA: Diagnosis not present

## 2023-02-05 DIAGNOSIS — R200 Anesthesia of skin: Secondary | ICD-10-CM | POA: Diagnosis not present

## 2023-02-05 DIAGNOSIS — I4510 Unspecified right bundle-branch block: Secondary | ICD-10-CM | POA: Diagnosis not present

## 2023-02-05 DIAGNOSIS — I440 Atrioventricular block, first degree: Secondary | ICD-10-CM | POA: Diagnosis not present

## 2023-02-05 DIAGNOSIS — D6869 Other thrombophilia: Secondary | ICD-10-CM | POA: Insufficient documentation

## 2023-02-05 DIAGNOSIS — I483 Typical atrial flutter: Secondary | ICD-10-CM | POA: Insufficient documentation

## 2023-02-05 DIAGNOSIS — M79604 Pain in right leg: Secondary | ICD-10-CM | POA: Diagnosis not present

## 2023-02-05 DIAGNOSIS — I444 Left anterior fascicular block: Secondary | ICD-10-CM | POA: Insufficient documentation

## 2023-02-05 LAB — CBC

## 2023-02-05 LAB — BASIC METABOLIC PANEL
BUN/Creatinine Ratio: 19 (ref 10–24)
BUN: 19 mg/dL (ref 8–27)
CO2: 23 mmol/L (ref 20–29)
Calcium: 9.6 mg/dL (ref 8.6–10.2)
Chloride: 103 mmol/L (ref 96–106)
Creatinine, Ser: 1.01 mg/dL (ref 0.76–1.27)
Glucose: 98 mg/dL (ref 70–99)
Potassium: 4.2 mmol/L (ref 3.5–5.2)
Sodium: 140 mmol/L (ref 134–144)
eGFR: 80 mL/min/{1.73_m2} (ref >59)

## 2023-02-05 NOTE — Patient Instructions (Addendum)
Medication Instructions:    Your physician recommends that you continue on your current medications as directed. Please refer to the Current Medication list given to you today.   *If you need a refill on your cardiac medications before your next appointment, please call your pharmacy*   Lab Work: BMET AND CBC TODAY   If you have labs (blood work) drawn today and your tests are completely normal, you will receive your results only by: MyChart Message (if you have MyChart) OR A paper copy in the mail If you have any lab test that is abnormal or we need to change your treatment, we will call you to review the results.   Testing/Procedures: Your physician has requested that you have an ankle brachial index (ABI). During this test an ultrasound and blood pressure cuff are used to evaluate the arteries that supply the arms and legs with blood. Allow thirty minutes for this exam. There are no restrictions or special instructions.   Your physician has requested that you have a lower AND  upper extremity arterial duplex. This test is an ultrasound of the arteries in the legs or arms. It looks at arterial blood flow in the legs and arms. Allow one hour for Lower and Upper Arterial scans. There are no restrictions or special instructions    Follow-Up: At [facility], you and your health needs are our priority.  As part of our continuing mission to provide you with exceptional heart care, we have created designated Provider Care Teams.  These Care Teams include your primary Cardiologist (physician) and Advanced Practice Providers (APPs -  Physician Assistants and Nurse Practitioners) who all work together to provide you with the care you need, when you need it.  We recommend signing up for the patient portal called "MyChart".  Sign up information is provided on this After Visit Summary.  MyChart is used to connect with patients for Virtual Visits (Telemedicine).  Patients are able to view  lab/test results, encounter notes, upcoming appointments, etc.  Non-urgent messages can be sent to your provider as well.   To learn more about what you can do with MyChart, go to [email or web address].    Your next appointment:   6 month(s)  Provider:   Francis Dowse, PA-C    Other Instructions

## 2023-02-15 ENCOUNTER — Ambulatory Visit (HOSPITAL_COMMUNITY)
Admission: RE | Admit: 2023-02-15 | Discharge: 2023-02-15 | Disposition: A | Discharge status: Home / Self Care | Payer: Medicare Other | Source: Ambulatory Visit | Attending: Cardiovascular Disease | Admitting: Cardiovascular Disease

## 2023-02-15 ENCOUNTER — Ambulatory Visit (HOSPITAL_BASED_OUTPATIENT_CLINIC_OR_DEPARTMENT_OTHER)
Admission: RE | Admit: 2023-02-15 | Discharge: 2023-02-15 | Disposition: A | Discharge status: Home / Self Care | Payer: Medicare Other | Source: Ambulatory Visit | Attending: Cardiovascular Disease | Admitting: Cardiovascular Disease

## 2023-02-15 DIAGNOSIS — R200 Anesthesia of skin: Secondary | ICD-10-CM | POA: Insufficient documentation

## 2023-02-15 DIAGNOSIS — R202 Paresthesia of skin: Secondary | ICD-10-CM | POA: Diagnosis not present

## 2023-02-15 DIAGNOSIS — M79605 Pain in left leg: Secondary | ICD-10-CM | POA: Diagnosis not present

## 2023-02-15 DIAGNOSIS — G458 Other transient cerebral ischemic attacks and related syndromes: Secondary | ICD-10-CM | POA: Insufficient documentation

## 2023-02-15 DIAGNOSIS — M79604 Pain in right leg: Secondary | ICD-10-CM | POA: Insufficient documentation

## 2023-02-15 LAB — VAS US ABI WITH/WO TBI
Left ABI: 1.13
Right ABI: 1.24

## 2023-02-19 ENCOUNTER — Ambulatory Visit (HOSPITAL_COMMUNITY): Payer: Medicare Other | Attending: Physician Assistant

## 2023-02-19 DIAGNOSIS — I428 Other cardiomyopathies: Secondary | ICD-10-CM | POA: Insufficient documentation

## 2023-02-19 LAB — ECHOCARDIOGRAM COMPLETE
Area-P 1/2: 2.79 cm2
S' Lateral: 2.93 cm

## 2023-02-26 DIAGNOSIS — Z23 Encounter for immunization: Secondary | ICD-10-CM | POA: Diagnosis not present

## 2023-02-27 ENCOUNTER — Other Ambulatory Visit: Payer: Self-pay | Admitting: Cardiology

## 2023-02-28 ENCOUNTER — Other Ambulatory Visit: Payer: Self-pay | Admitting: Cardiology

## 2023-03-12 ENCOUNTER — Other Ambulatory Visit: Payer: Self-pay | Admitting: Family Medicine

## 2023-03-12 DIAGNOSIS — M1990 Unspecified osteoarthritis, unspecified site: Secondary | ICD-10-CM

## 2023-03-12 NOTE — Telephone Encounter (Signed)
Last appt with Dr Salomon Fick on 04/03/22. Last Rx for Tramadol filled on 01/07/23. I am sending 1/2 Rx and she needs to arrange a follow up appt. Thanks, BJ

## 2023-03-23 ENCOUNTER — Other Ambulatory Visit: Payer: Self-pay | Admitting: Cardiology

## 2023-03-23 DIAGNOSIS — I483 Typical atrial flutter: Secondary | ICD-10-CM

## 2023-03-25 NOTE — Telephone Encounter (Signed)
Eliquis 5mg  refill request received. Patient is 72 years old, weight-78.7kg, Crea-1.01 on 02/05/23, Diagnosis-Aflutter, and last seen by Francis Dowse on 02/05/23. Dose is appropriate based on dosing criteria. Will send in refill to requested pharmacy.

## 2023-04-10 ENCOUNTER — Ambulatory Visit: Payer: Medicare Other

## 2023-04-10 VITALS — BP 122/62 | HR 66 | Temp 98.3°F | Ht 70.0 in | Wt 178.2 lb

## 2023-04-10 DIAGNOSIS — Z0000 Encounter for general adult medical examination without abnormal findings: Secondary | ICD-10-CM | POA: Diagnosis not present

## 2023-04-10 NOTE — Progress Notes (Signed)
Medicare  Subjective:   Joseph Gill is a 72 y.o. male who presents for Medicare Annual/Subsequent preventive examination.  Visit Complete: In person  Patient Medicare AWV questionnaire was completed by the patient on 04/08/23; I have confirmed that all information answered by patient is correct and no changes since this date.  Cardiac Risk Factors include: advanced age (>84men, >49 women);dyslipidemia;male gender     Objective:    Today's Vitals   04/10/23 1402  BP: 122/62  Pulse: 66  Temp: 98.3 F (36.8 C)  TempSrc: Oral  SpO2: 90%  Weight: 178 lb 3.2 oz (80.8 kg)  Height: 5\' 10"  (1.778 m)   Body mass index is 25.57 kg/m.     04/10/2023    2:19 PM 04/03/2022    9:26 AM 09/22/2021    9:16 AM 03/07/2021    1:41 PM 02/20/2021    3:30 PM 07/17/2016   10:16 AM  Advanced Directives  Does Patient Have a Medical Advance Directive? Yes Yes Yes Yes No Yes  Type of Estate agent of [place];Living will Healthcare Power of [place];Living will Healthcare Power of [employer] Power of Attorney  Living will;Healthcare Power of Attorney  Copy of Healthcare Power of Attorney in Chart? No - copy requested No - copy requested  No - copy requested  No - copy requested    Current Medications (verified) Outpatient Encounter Medications as of 04/10/2023  Medication Sig   acetaminophen (TYLENOL) 500 MG tablet Take 1,000 mg by mouth every 8 (eight) hours as needed for moderate pain.   apixaban (ELIQUIS) 5 MG TABS tablet TAKE 1 TABLET BY MOUTH TWICE A DAY   Ascorbic Acid (VITAMIN C) 1000 MG tablet Take 1,000 mg by mouth every Monday, Tuesday, Wednesday, Thursday, and Friday.   atorvastatin (LIPITOR) 40 MG tablet TAKE 1 TABLET BY MOUTH EVERY DAY   bacitracin-polymyxin b (POLYSPORIN) ophthalmic ointment Place 1 Application into the left eye 2 (two) times daily. apply to eye every 12 hours while awake   Carboxymethylcellulose Sodium 1 % GEL Place 1 drop into both  eyes daily as needed (dry eyes).   diphenhydrAMINE (BENADRYL) 25 MG tablet Take 25 mg by mouth every 6 (six) hours as needed for allergies.   diphenhydramine-acetaminophen (TYLENOL PM) 25-500 MG TABS tablet Take 1 tablet by mouth at bedtime.   empagliflozin (JARDIANCE) 10 MG TABS tablet TAKE 1 TABLET BY MOUTH EVERY DAY BEFORE BREAKFAST   loratadine (CLARITIN) 10 MG tablet Take 10 mg by mouth in the morning.   losartan (COZAAR) 25 MG tablet TAKE 1 TABLET (25 MG TOTAL) BY MOUTH DAILY.   metoprolol succinate (TOPROL-XL) 50 MG 24 hr tablet Take 1 tablet (50 mg total) by mouth 2 (two) times daily.   Multiple Vitamin (MULTIVITAMIN WITH MINERALS) TABS tablet Take 1 tablet by mouth in the morning.   pseudoephedrine (SUDAFED) 30 MG tablet Take 30 mg by mouth See admin instructions. Take 1 tablet (30 mg) scheduled by mouth in the morning & may take an additional dose if needed for allergies/sinus issues.   spironolactone (ALDACTONE) 25 MG tablet TAKE 1 TABLET (25 MG TOTAL) BY MOUTH DAILY.   SUMAtriptan (IMITREX) 100 MG tablet TAKE 1 TABLET AT ONSET, MAY REPEAT IN 2 HOURS IF HEADACHE PERSISTS OR RECURS.   traMADol (ULTRAM) 50 MG tablet TAKE 1 TABLET BY MOUTH EVERY 12 HOURS AS NEEDED FOR ARTHRITIS PAIN   triamcinolone (NASACORT) 55 MCG/ACT AERO nasal inhaler Place 1 spray into the nose daily.   [DISCONTINUED]  amitriptyline (ELAVIL) 25 MG tablet TAKE 1 TABLET BY MOUTH EVERYDAY AT BEDTIME   [DISCONTINUED] metoprolol succinate (TOPROL-XL) 25 MG 24 hr tablet TAKE 1 TABLET BY MOUTH TWICE A DAY   No facility-administered encounter medications on file as of 04/10/2023.    Allergies (verified) Erythromycin   History: Past Medical History:  Diagnosis Date   ALLERGIC RHINITIS 03/26/2007   Allergy    Back pain    knee and leg pain   Cataract    Chronically dry eyes, bilateral    Complication of anesthesia    Per pt, hard to wake up past certain sedation!   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   Heart  murmur    Hyperlipidemia    Irregular heart beat    MIGRAINE HEADACHE 11/03/2008   Past Surgical History:  Procedure Laterality Date   A-FLUTTER ABLATION N/A 09/22/2021   Procedure: A-FLUTTER ABLATION;  Surgeon: Lanier Prude, MD;  Location: [facility] INVASIVE CV LAB;  Service: Cardiovascular;  Laterality: N/A;   CATARACT EXTRACTION, BILATERAL  2015   NASAL SEPTUM SURGERY     NASAL SINUS SURGERY     TONSILLECTOMY     Family History  Problem Relation Age of Onset   Heart disease Mother    Heart disease Father    Lung cancer Maternal Uncle    Heart disease Maternal Grandfather    Prostate cancer Paternal Grandfather    Social History   Socioeconomic History   Marital status: Married    Spouse name: Not on file   Number of children: Not on file   Years of education: Not on file   Highest education level: Bachelor's degree (e.g., BA, AB, BS)  Occupational History   Not on file  Tobacco Use   Smoking status: Never   Smokeless tobacco: Never  Vaping Use   Vaping status: Never Used  Substance and Sexual Activity   Alcohol use: Yes    Alcohol/week: 4.0 standard drinks of alcohol    Types: 1 Glasses of wine, 1 Cans of beer, 1 Shots of liquor, 1 Standard drinks or equivalent per week    Comment: occ   Drug use: No   Sexual activity: Not on file  Other Topics Concern   Not on file  Social History Narrative   Not on file   Social Determinants of Health   Financial Resource Strain: Low Risk  (04/08/2023)   Overall Financial Resource Strain (CARDIA)    Difficulty of Paying Living Expenses: Not hard at all  Food Insecurity: No Food Insecurity (04/08/2023)   Hunger Vital Sign    Worried About Running Out of Food in the Last Year: Never true    Ran Out of Food in the Last Year: Never true  Transportation Needs: No Transportation Needs (04/08/2023)   PRAPARE - Administrator, Civil Service (Medical): No    Lack of Transportation (Non-Medical): No  Physical Activity:  Inactive (04/08/2023)   Exercise Vital Sign    Days of Exercise per Week: 0 days    Minutes of Exercise per Session: 30 min  Stress: No Stress Concern Present (04/08/2023)   [employer] of Occupational Health - Occupational Stress Questionnaire    Feeling of Stress : Not at all  Social Connections: Unknown (04/08/2023)   Social Connection and Isolation Panel [NHANES]    Frequency of Communication with Friends and Family: Never    Frequency of Social Gatherings with Friends and Family: Never    Attends Religious Services: Not on  file    Active Member of Clubs or Organizations: No    Attends Banker Meetings: Never    Marital Status: Married    Tobacco Counseling Counseling given: Not Answered   Clinical Intake:  Pre-visit preparation completed: Yes  Pain : No/denies pain     BMI - recorded: 25.57 Nutritional Status: BMI 25 -29 Overweight Nutritional Risks: None Diabetes: No  How often do you need to have someone help you when you read instructions, pamphlets, or other written materials from your doctor or pharmacy?: 2 - Rarely  Interpreter Needed?: No  Information entered by :: Theresa Mulligan LPN   Activities of Daily Living    04/08/2023   10:07 AM  In your present state of health, do you have any difficulty performing the following activities:  Hearing? 0  Vision? 0  Difficulty concentrating or making decisions? 0  Walking or climbing stairs? 0  Dressing or bathing? 0  Doing errands, shopping? 0  Preparing Food and eating ? N  Using the Toilet? N  In the past six months, have you accidently leaked urine? Y  Comment Patient decline follow by medical attention  Do you have problems with loss of bowel control? N  Managing your Medications? N  Managing your Finances? N  Housekeeping or managing your Housekeeping? N    Patient Care Team: Deeann Saint, MD as PCP - General (Family Medicine) Lanier Prude, MD as PCP -  Electrophysiology (Cardiology)  Indicate any recent Medical Services you may have received from other than [facility] providers in the past year (date may be approximate).     Assessment:   This is a routine wellness examination for Joseph Gill.  Hearing/Vision screen Hearing Screening - Comments:: Denies hearing difficulties   Vision Screening - Comments:: Wears rx glasses - up to date with routine eye exams with  [facility]   Goals Addressed               This Visit's Progress     Increase physical activity (pt-stated)         Depression Screen    04/10/2023    2:22 PM 04/03/2022    9:27 AM 03/07/2021    1:40 PM 02/24/2020   10:39 AM 01/26/2014    2:51 PM  PHQ 2/9 Scores  PHQ - 2 Score 0 0 0 0 0    Fall Risk    04/10/2023    2:18 PM 04/08/2023   10:07 AM 04/03/2022   10:55 AM 04/03/2022    9:27 AM 03/30/2022   11:22 AM  Fall Risk   Falls in the past year? 0 0 0 0 0  Number falls in past yr: 0 0  0 0  Injury with Fall? 0 0  0 0  Risk for fall due to : No Fall Risks   Medication side effect   Follow up Falls prevention discussed   Falls prevention discussed;Education provided;Falls evaluation completed     MEDICARE RISK AT HOME: Medicare Risk at Home Any stairs in or around the home?: Yes If so, are there any without handrails?: Yes Home free of loose throw rugs in walkways, pet beds, electrical cords, etc?: Yes Adequate lighting in your home to reduce risk of falls?: Yes Life alert?: No Use of a cane, walker or w/c?: No Grab bars in the bathroom?: No Shower chair or bench in shower?: No Elevated toilet seat or a handicapped toilet?: No  TIMED UP AND GO:  Was  the test performed?  Yes  Length of time to ambulate 10 feet: 10 sec Gait steady and fast without use of assistive device    Cognitive Function:        04/10/2023    2:19 PM 03/07/2021    1:45 PM  6CIT Screen  What Year? 0 points 0 points  What month? 0 points 0 points  What time? 0 points 0 points   Count back from 20 0 points 0 points  Months in reverse 0 points 0 points  Repeat phrase 0 points 0 points  Total Score 0 points 0 points    Immunizations Immunization History  Administered Date(s) Administered   Fluad Quad(high Dose 65+) 03/17/2020, 04/03/2022   Influenza Split 04/27/2011, 03/18/2012   Influenza Whole 04/09/2007, 03/27/2008, 03/29/2009, 03/07/2010   Influenza, High Dose Seasonal PF 03/27/2017, 03/31/2018, 04/03/2019   Influenza, Seasonal, Injecte, Preservative Fre 03/10/2015   Influenza,inj,quad, With Preservative 04/03/2021   Influenza-Unspecified 03/31/2014, 03/15/2016, 03/27/2017   PFIZER Comirnaty(Gray Top)Covid-19 Tri-Sucrose Vaccine 03/29/2022   PFIZER(Purple Top)SARS-COV-2 Vaccination 08/01/2019, 08/26/2019, 03/29/2020, 10/18/2020   Pfizer Covid-19 Vaccine Bivalent Booster 61yrs & up 03/08/2021   Pneumococcal Conjugate-13 04/24/2017   Pneumococcal Polysaccharide-23 04/24/2018   Respiratory Syncytial Virus Vaccine,Recomb Aduvanted(Arexvy) 05/25/2022   Td 06/25/1996   Tdap 01/26/2014   Unspecified SARS-COV-2 Vaccination 03/29/2022   Zoster Recombinant(Shingrix) 11/09/2019, 02/12/2020   Zoster, Live 03/25/2012    TDAP status: Up to date  Flu Vaccine status: Up to date  Pneumococcal vaccine status: Up to date  Covid-19 vaccine status: Declined, Education has been provided regarding the importance of this vaccine but patient still declined. Advised may receive this vaccine at local pharmacy or Health Dept.or vaccine clinic. Aware to provide a copy of the vaccination record if obtained from local pharmacy or Health Dept. Verbalized acceptance and understanding.  Qualifies for Shingles Vaccine? Yes   Zostavax completed Yes   Shingrix Completed?: Yes  Screening Tests Health Maintenance  Topic Date Due   Colonoscopy  07/17/2021   COVID-19 Vaccine (7 - 2023-24 season) 02/24/2023   DTaP/Tdap/Td (3 - Td or Tdap) 01/27/2024   Medicare Annual Wellness (AWV)   04/09/2024   Pneumonia Vaccine 82+ Years old  Completed   INFLUENZA VACCINE  Completed   Hepatitis C Screening  Completed   Zoster Vaccines- Shingrix  Completed   HPV VACCINES  Aged Out    Health Maintenance  Health Maintenance Due  Topic Date Due   Colonoscopy  07/17/2021   COVID-19 Vaccine (7 - 2023-24 season) 02/24/2023    Colorectal cancer screening: Referral to GI placed Deferred. Pt aware the office will call re: appt.    Additional Screening:  Hepatitis C Screening: does qualify; Completed 03/01/16  Vision Screening: Recommended annual ophthalmology exams for early detection of glaucoma and other disorders of the eye. Is the patient up to date with their annual eye exam?  Yes  Who is the provider or what is the name of the office in which the patient attends annual eye exams? [facility] If pt is not established with a provider, would they like to be referred to a provider to establish care? No .   Dental Screening: Recommended annual dental exams for proper oral hygiene   Community Resource Referral / Chronic Care Management:  CRR required this visit?  No   CCM required this visit?  No     Plan:     I have personally reviewed and noted the following in the patient's chart:  Medical and social history Use of alcohol, tobacco or illicit drugs  Current medications and supplements including opioid prescriptions. Patient is currently taking opioid prescriptions. Information provided to patient regarding non-opioid alternatives. Patient advised to discuss non-opioid treatment plan with their provider. Functional ability and status Nutritional status Physical activity Advanced directives List of other physicians Hospitalizations, surgeries, and ER visits in previous 12 months Vitals Screenings to include cognitive, depression, and falls Referrals and appointments  In addition, I have reviewed and discussed with patient certain preventive protocols, quality  metrics, and best practice recommendations. A written personalized care plan for preventive services as well as general preventive health recommendations were provided to patient.     Tillie Rung, LPN   09/81/1914   After Visit Summary: Given  Nurse Notes: None

## 2023-04-10 NOTE — Patient Instructions (Addendum)
Joseph Gill , Thank you for taking time to come for your Medicare Wellness Visit. I appreciate your ongoing commitment to your health goals. Please review the following plan we discussed and let me know if I can assist you in the future.   Referrals/Orders/Follow-Ups/Clinician Recommendations:   This is a list of the screening recommended for you and due dates:  Health Maintenance  Topic Date Due   Colon Cancer Screening  07/17/2021   COVID-19 Vaccine (7 - 2023-24 season) 02/24/2023   DTaP/Tdap/Td vaccine (3 - Td or Tdap) 01/27/2024   Medicare Annual Wellness Visit  04/09/2024   Pneumonia Vaccine  Completed   Flu Shot  Completed   Hepatitis C Screening  Completed   Zoster (Shingles) Vaccine  Completed   HPV Vaccine  Aged Out  Opioid Pain Medicine Management Opioids are powerful medicines that are used to treat moderate to severe pain. When used for short periods of time, they can help you to: Sleep better. Do better in physical or occupational therapy. Feel better in the first few days after an injury. Recover from surgery. Opioids should be taken with the supervision of a trained health care provider. They should be taken for the shortest period of time possible. This is because opioids can be addictive, and the longer you take opioids, the greater your risk of addiction. This addiction can also be called opioid use disorder. What are the risks? Using opioid pain medicines for longer than 3 days increases your risk of side effects. Side effects include: Constipation. Nausea and vomiting. Breathing difficulties (respiratory depression). Drowsiness. Confusion. Opioid use disorder. Itching. Taking opioid pain medicine for a long period of time can affect your ability to do daily tasks. It also puts you at risk for: Motor vehicle crashes. Depression. Suicide. Heart attack. Overdose, which can be life-threatening. What is a pain treatment plan? A pain treatment plan is an  agreement between you and your health care provider. Pain is unique to each person, and treatments vary depending on your condition. To manage your pain, you and your health care provider need to work together. To help you do this: Discuss the goals of your treatment, including how much pain you might expect to have and how you will manage the pain. Review the risks and benefits of taking opioid medicines. Remember that a good treatment plan uses more than one approach and minimizes the chance of side effects. Be honest about the amount of medicines you take and about any drug or alcohol use. Get pain medicine prescriptions from only one health care provider. Pain can be managed with many types of alternative treatments. Ask your health care provider to refer you to one or more specialists who can help you manage pain through: Physical or occupational therapy. Counseling (cognitive behavioral therapy). Good nutrition. Biofeedback. Massage. Meditation. Non-opioid medicine. Following a gentle exercise program. How to use opioid pain medicine Taking medicine Take your pain medicine exactly as told by your health care provider. Take it only when you need it. If your pain gets less severe, you may take less than your prescribed dose if your health care provider approves. If you are not having pain, do nottake pain medicine unless your health care provider tells you to take it. If your pain is severe, do nottry to treat it yourself by taking more pills than instructed on your prescription. Contact your health care provider for help. Write down the times when you take your pain medicine. It is easy to become  confused while on pain medicine. Writing the time can help you avoid overdose. Take other over-the-counter or prescription medicines only as told by your health care provider. Keeping yourself and others safe  While you are taking opioid pain medicine: Do not drive, use machinery, or power  tools. Do not sign legal documents. Do not drink alcohol. Do not take sleeping pills. Do not supervise children by yourself. Do not do activities that require climbing or being in high places. Do not go to a lake, river, ocean, spa, or swimming pool. Do not share your pain medicine with anyone. Keep pain medicine in a locked cabinet or in a secure area where pets and children cannot reach it. Stopping your use of opioids If you have been taking opioid medicine for more than a few weeks, you may need to slowly decrease (taper) how much you take until you stop completely. Tapering your use of opioids can decrease your risk of symptoms of withdrawal, such as: Pain and cramping in the abdomen. Nausea. Sweating. Sleepiness. Restlessness. Uncontrollable shaking (tremors). Cravings for the medicine. Do not attempt to taper your use of opioids on your own. Talk with your health care provider about how to do this. Your health care provider may prescribe a step-down schedule based on how much medicine you are taking and how long you have been taking it. Getting rid of leftover pills Do not save any leftover pills. Get rid of leftover pills safely by: Taking the medicine to a prescription take-back program. This is usually offered by the county or law enforcement. Bringing them to a pharmacy that has a drug disposal container. Flushing them down the toilet. Check the label or package insert of your medicine to see whether this is safe to do. Throwing them out in the trash. Check the label or package insert of your medicine to see whether this is safe to do. If it is safe to throw it out, remove the medicine from the original container, put it into a sealable bag or container, and mix it with used coffee grounds, food scraps, dirt, or cat litter before putting it in the trash. Follow these instructions at home: Activity Do exercises as told by your health care provider. Avoid activities that make  your pain worse. Return to your normal activities as told by your health care provider. Ask your health care provider what activities are safe for you. General instructions You may need to take these actions to prevent or treat constipation: Drink enough fluid to keep your urine pale yellow. Take over-the-counter or prescription medicines. Eat foods that are high in fiber, such as beans, whole grains, and fresh fruits and vegetables. Limit foods that are high in fat and processed sugars, such as fried or sweet foods. Keep all follow-up visits. This is important. Where to find support If you have been taking opioids for a long time, you may benefit from receiving support for quitting from a local support group or counselor. Ask your health care provider for a referral to these resources in your area. Where to find more information Centers for Disease Control and Prevention (CDC): FootballExhibition.com.br [place] Food and Drug Administration (FDA): PumpkinSearch.com.ee Get help right away if: You may have taken too much of an opioid (overdosed). Common symptoms of an overdose: Your breathing is slower or more shallow than normal. You have a very slow heartbeat (pulse). You have slurred speech. You have nausea and vomiting. Your pupils become very small. You have other potential symptoms: You  are very confused. You faint or feel like you will faint. You have cold, clammy skin. You have blue lips or fingernails. You have thoughts of harming yourself or harming others. These symptoms may represent a serious problem that is an emergency. Do not wait to see if the symptoms will go away. Get medical help right away. Call your local emergency services (911 in the [place] Do not drive yourself to the hospital.  If you ever feel like you may hurt yourself or others, or have thoughts about taking your own life, get help right away. Go to your nearest emergency department or: Call your local emergency services (911 in the  [place] Call the [facility] (3434470771 in the [place] Call a suicide crisis helpline, such as the National Suicide Prevention Lifeline at 316-350-9395 or 988 in the [place] This is open 24 hours a day in the [place] Text the Crisis Text Line at (585) 786-5634 (in the [place] Summary Opioid medicines can help you manage moderate to severe pain for a short period of time. A pain treatment plan is an agreement between you and your health care provider. Discuss the goals of your treatment, including how much pain you might expect to have and how you will manage the pain. If you think that you or someone else may have taken too much of an opioid, get medical help right away. This information is not intended to replace advice given to you by your health care provider. Make sure you discuss any questions you have with your health care provider. Document Revised: 01/04/2021 Document Reviewed: 09/21/2020 Elsevier Patient Education  2024 Elsevier Inc.   Advanced directives: (Copy Requested) Please bring a copy of your health care power of attorney and living will to the office to be added to your chart at your convenience.  Next Medicare Annual Wellness Visit scheduled for next year: Yes

## 2023-04-12 ENCOUNTER — Other Ambulatory Visit: Payer: Self-pay | Admitting: Family Medicine

## 2023-04-12 DIAGNOSIS — E785 Hyperlipidemia, unspecified: Secondary | ICD-10-CM

## 2023-04-24 IMAGING — DX DG CHEST 1V PORT
1 series · 2 of 2 positions shown · non-contrast
Comparison: Portable exam 0118 hours compared to 07/19/2016

CLINICAL DATA: Acute chest pain for 2 days, shortness of breath

EXAM:
PORTABLE CHEST 1 VIEW

[Series 1: chest · 0.14mm/px · 2 of 2 slices shown]
[im 1/2]
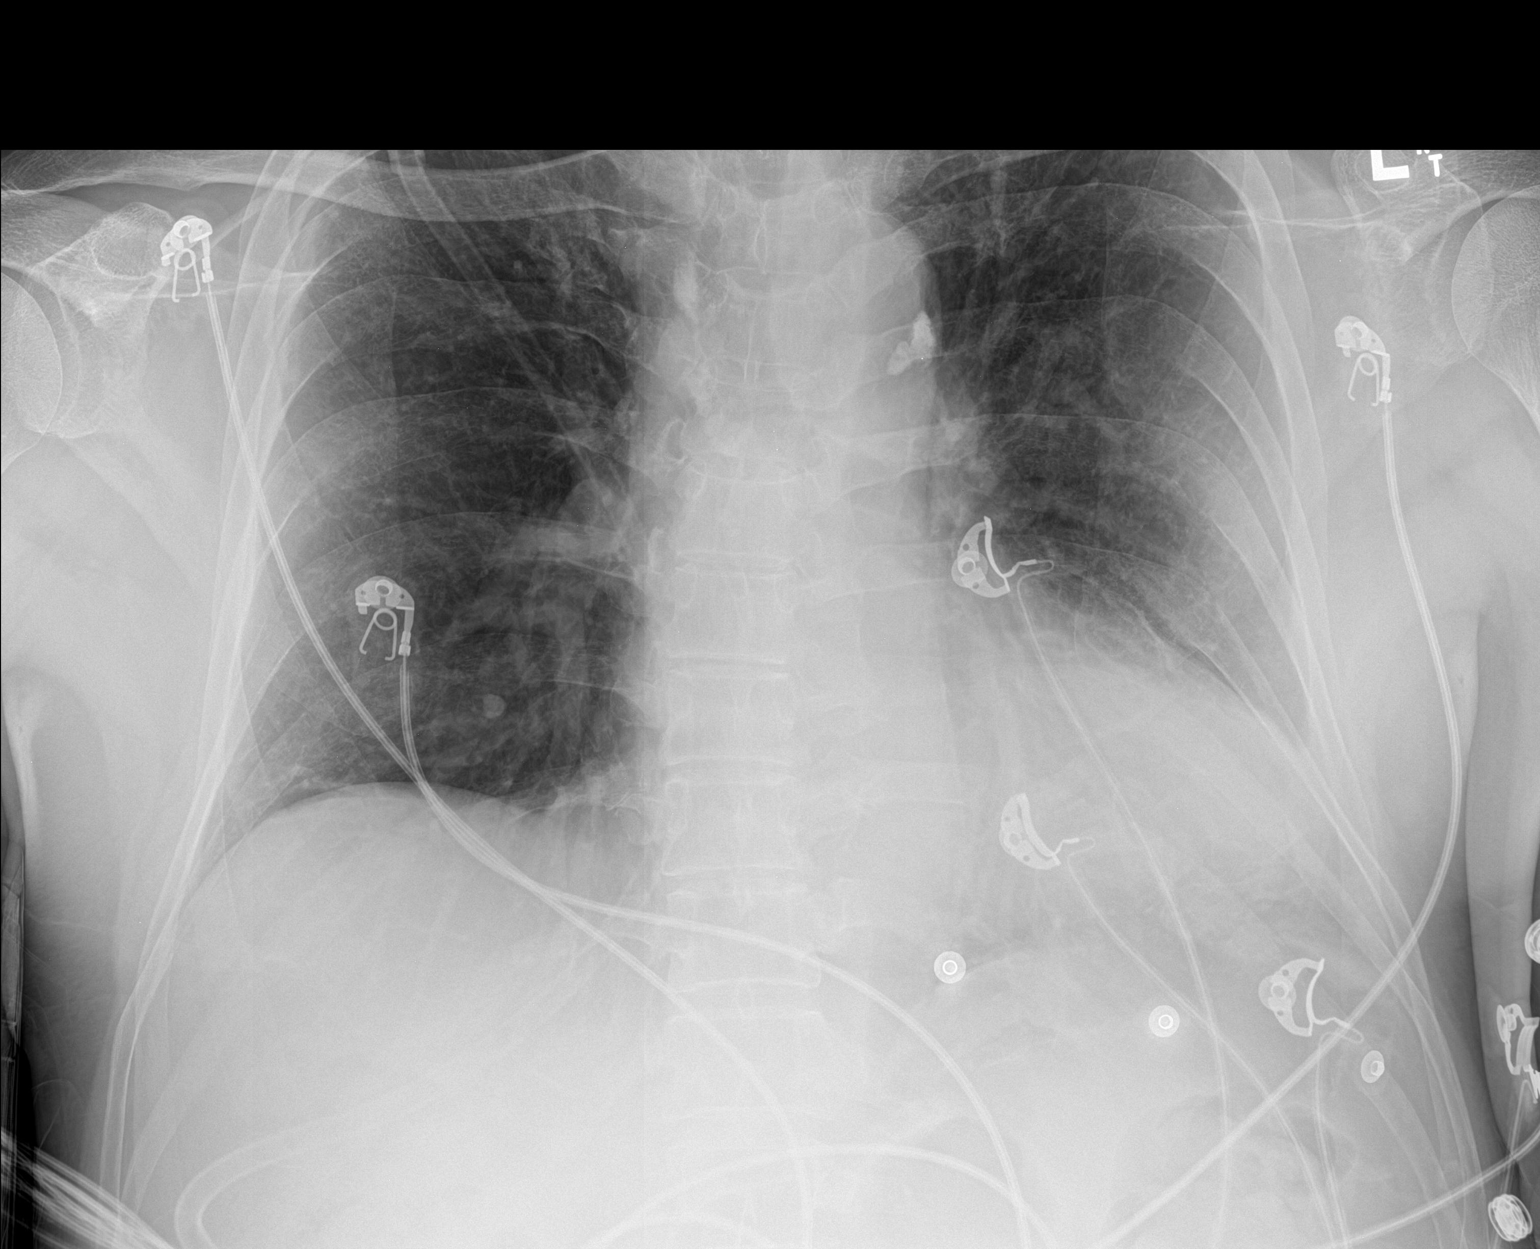
[im 2/2]
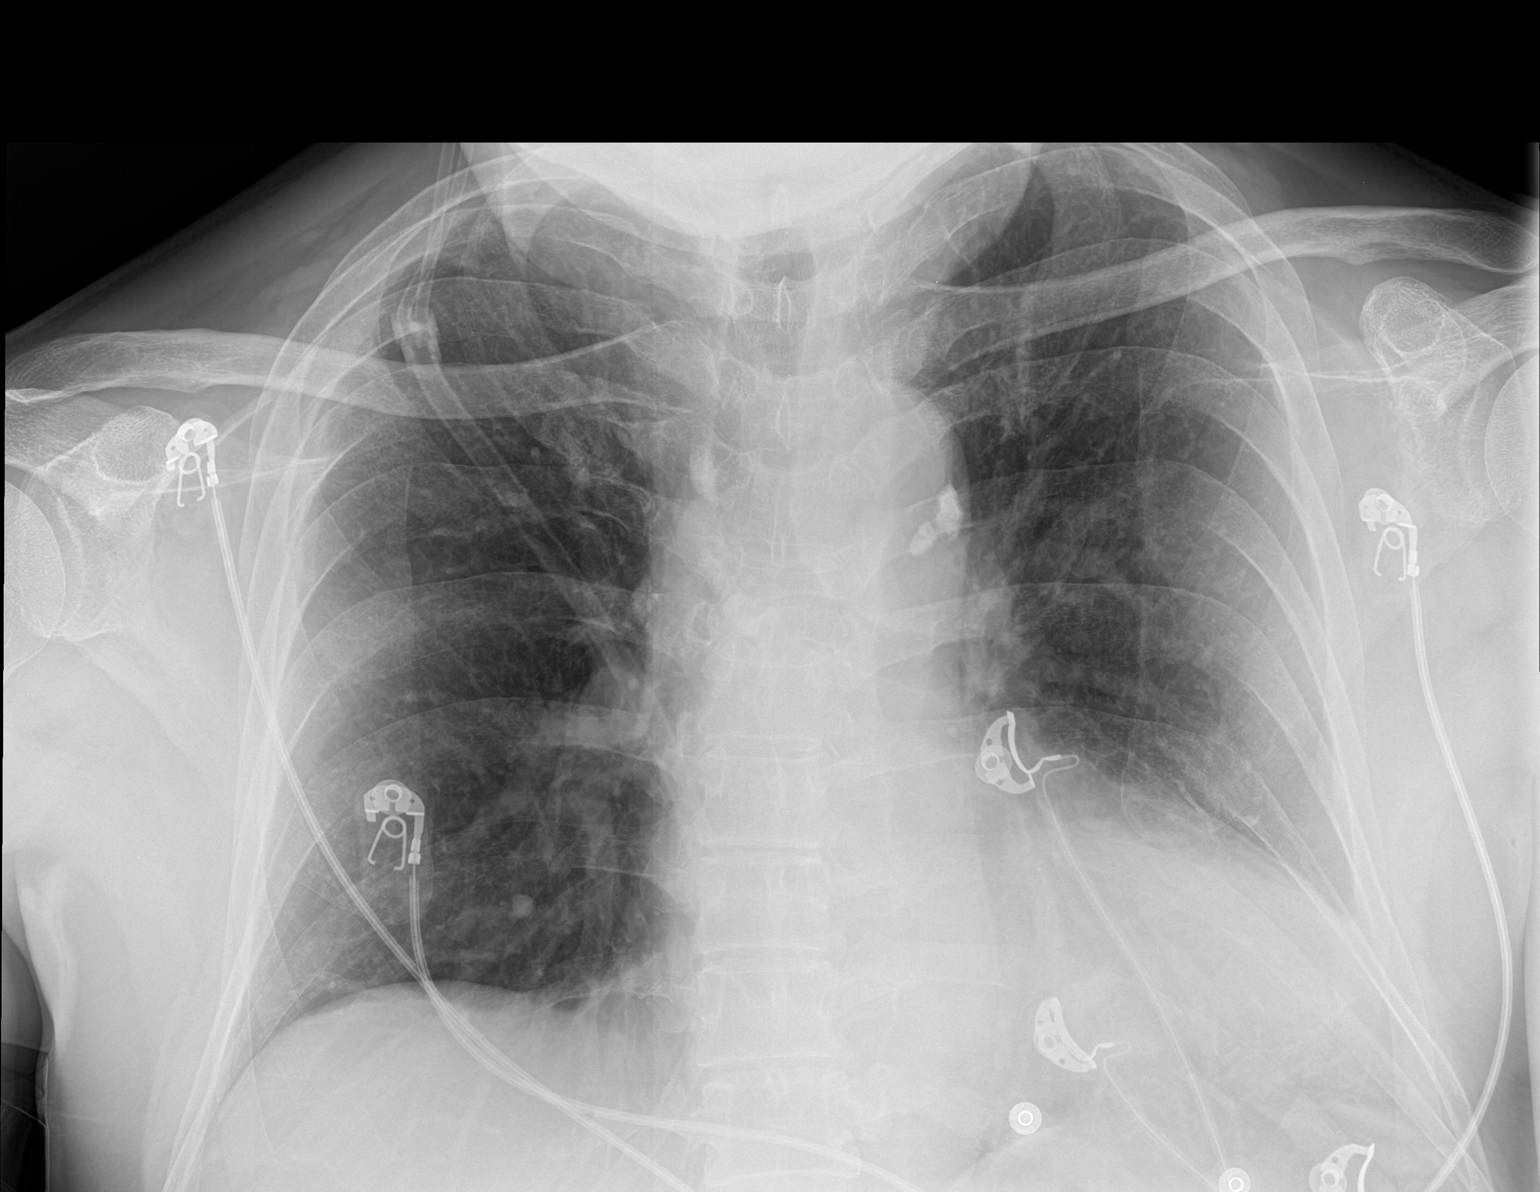

[2 of 2 positions shown; findings below may reference images not displayed]

FINDINGS: Enlargement of cardiac silhouette.

Mediastinal contours and pulmonary vascularity normal.

Probable calcified AP window lymph nodes.

Minimal biapical scarring greater on RIGHT.

Lungs otherwise clear.

No acute infiltrate, pleural effusion, or pneumothorax.
IMPRESSION: No acute abnormalities.

Enlargement of cardiac silhouette.

## 2023-05-03 ENCOUNTER — Other Ambulatory Visit: Payer: Self-pay | Admitting: Cardiology

## 2023-05-03 ENCOUNTER — Other Ambulatory Visit: Payer: Self-pay | Admitting: Physician Assistant

## 2023-05-05 ENCOUNTER — Other Ambulatory Visit: Payer: Self-pay | Admitting: Family Medicine

## 2023-05-05 DIAGNOSIS — E785 Hyperlipidemia, unspecified: Secondary | ICD-10-CM

## 2023-05-09 ENCOUNTER — Other Ambulatory Visit: Payer: Self-pay | Admitting: Family Medicine

## 2023-05-09 DIAGNOSIS — E785 Hyperlipidemia, unspecified: Secondary | ICD-10-CM

## 2023-05-18 ENCOUNTER — Other Ambulatory Visit: Payer: Self-pay | Admitting: Family Medicine

## 2023-05-18 ENCOUNTER — Other Ambulatory Visit: Payer: Self-pay | Admitting: Cardiology

## 2023-05-18 DIAGNOSIS — E785 Hyperlipidemia, unspecified: Secondary | ICD-10-CM

## 2023-05-24 DIAGNOSIS — Z23 Encounter for immunization: Secondary | ICD-10-CM | POA: Diagnosis not present

## 2023-06-17 ENCOUNTER — Telehealth: Payer: Self-pay

## 2023-06-17 DIAGNOSIS — E785 Hyperlipidemia, unspecified: Secondary | ICD-10-CM

## 2023-06-17 NOTE — Telephone Encounter (Signed)
Copied from CRM [id number]. Topic: Clinical - Medication Refill >> Jun 17, 2023  9:42 AM Cassiday T wrote: Most Recent Primary Care Visit:  Provider: Tillie Rung  Department: [facility]  Visit Type: MEDICARE AWV, SEQUENTIAL  Date: 04/10/2023  Medication: atorvastatin (LIPITOR) 40 MG tablet [  Has the patient contacted their pharmacy? Yes he was told to reach out to the office  (Agent: If no, request that the patient contact the pharmacy for the refill. If patient does not wish to contact the pharmacy document the reason why and proceed with request.) (Agent: If yes, when and what did the pharmacy advise?)  Is this the correct pharmacy for this prescription? Yes If no, delete pharmacy and type the correct one.  This is the patient's preferred pharmacy:  [id number] [place], [place] - [address] [place] [address] Phone: [phone] Fax: [phone]  Phone: [phone] Fax: [phone]   Has the prescription been filled recently? No  Is the patient out of the medication? Yes  Has the patient been seen for an appointment in the last year OR does the patient have an upcoming appointment? Yes  Can we respond through MyChart? No  Agent: Please be advised that Rx refills may take up to 3 business days. We ask that you follow-up with your pharmacy.

## 2023-06-18 MED ORDER — ATORVASTATIN CALCIUM 40 MG PO TABS
40.0000 mg | ORAL_TABLET | Freq: Every day | ORAL | 0 refills | Status: DC
Start: 2023-06-18 — End: 2023-08-29

## 2023-06-18 NOTE — Addendum Note (Signed)
Addended by: Philipp Deputy A on: 06/18/2023 10:18 AM   Modules accepted: Orders

## 2023-06-24 ENCOUNTER — Other Ambulatory Visit: Payer: Self-pay | Admitting: Family Medicine

## 2023-06-24 DIAGNOSIS — E785 Hyperlipidemia, unspecified: Secondary | ICD-10-CM

## 2023-07-10 ENCOUNTER — Encounter: Payer: Self-pay | Admitting: Gastroenterology

## 2023-07-30 ENCOUNTER — Encounter: Payer: Self-pay | Admitting: Gastroenterology

## 2023-08-10 NOTE — Progress Notes (Unsigned)
 Cardiology Office Note Date:  08/10/2023  Patient ID:  Wallie, Lagrand Nov 05, 1950, MRN [id number] PCP:  Deeann Saint, MD Electrophysiologist: Dr. Lalla Brothers    Chief Complaint:  *** 6 mo  History of Present Illness: RAUNAK ANTUNA is a 73 y.o. male with history of AFlutter (ablated), DCM felt 2/2 tachycardia > improved, HLD  He saw Dr. Lalla Brothers 12/27/21, doing well, EF had improved (to 48% by c.MRI), no symptoms of AFlutter, discussed perhaps stopping OAC at his next visit with consideration of loop implant  I saw him 02/05/23 Cardiac-wise, in the last year had a single episode of CP occurred at night just came and went, non otherwise. No rest SOB or difficulties with ADLs but gets winded with heavier activities like yard work, this is ongoing for a couple years, not worse, but was hoping would improve after the ablation. No near syncope or syncope. No bleeding or signs of bleeding He does have concerns that he may be having medication reaction Says when he has his arm elevated even for a fairly brief periods his hands go numb and are painful. This is not new, but definitely worse and takes longer to go away. B/l plantar feet sensitivity. Bottom of his feet hurt. And generally feel "different" then they used to No palpitations, though reports he was unaware of his Aflutter/rhythm before  Planned Updated echo ABI/vascular eval Discussed trifascicular block >> would not press nodal blocker further There is risk of developing AFib for him Introduced the ide of loop implant and stopping his OAC. For now, no changes  LVEF recovered to 50-55% Vascular eval was also OK  *** loop???  Stop OAC? *** symptoms *** brady, palps? *** symptoms not likely a good marker for his arrhythmia, was unaware of his flutter   Aflutter/AAD hx CTI ablation 09/22/21   Past Medical History:  Diagnosis Date   ALLERGIC RHINITIS 03/26/2007   Allergy    Back pain    knee and leg pain    Cataract    Chronically dry eyes, bilateral    Complication of anesthesia    Per pt, hard to wake up past certain sedation!   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   Heart murmur    Hyperlipidemia    Irregular heart beat    MIGRAINE HEADACHE 11/03/2008    Past Surgical History:  Procedure Laterality Date   A-FLUTTER ABLATION N/A 09/22/2021   Procedure: A-FLUTTER ABLATION;  Surgeon: Lanier Prude, MD;  Location: [facility] INVAS[facility] CV LAB;  Service: Cardiovascular;  Laterality: N/A;   CATARACT EXTRACTION, BILATERAL  2015   NASAL SEPTUM SURGERY     NASAL SINUS SURGERY     TONSILLECTOMY      Current Outpatient Medications  Medication Sig Dispense Refill   acetaminophen (TYLENOL) 500 MG tablet Take 1,000 mg by mouth every 8 (eight) hours as needed for moderate pain.     apixaban (ELIQUIS) 5 MG TABS tablet TAKE 1 TABLET BY MOUTH TWICE A DAY 180 tablet 2   Ascorbic Acid (VITAMIN C) 1000 MG tablet Take 1,000 mg by mouth every Monday, Tuesday, Wednesday, Thursday, and Friday.     atorvastatin (LIPITOR) 40 MG tablet Take 1 tablet (40 mg total) by mouth daily. 30 tablet 0   bacitracin-polymyxin b (POLYSPORIN) ophthalmic ointment Place 1 Application into the left eye 2 (two) times daily. apply to eye every 12 hours while awake 3.5 g 0   Carboxymethylcellulose Sodium 1 % GEL Place 1 drop into  both eyes daily as needed (dry eyes).     diphenhydrAMINE (BENADRYL) 25 MG tablet Take 25 mg by mouth every 6 (six) hours as needed for allergies.     diphenhydramine-acetaminophen (TYLENOL PM) 25-500 MG TABS tablet Take 1 tablet by mouth at bedtime.     empagliflozin (JARDIANCE) 10 MG TABS tablet TAKE 1 TABLET BY MOUTH EVERY DAY BEFORE BREAKFAST 90 tablet 2   loratadine (CLARITIN) 10 MG tablet Take 10 mg by mouth in the morning.     losartan (COZAAR) 25 MG tablet TAKE 1 TABLET (25 MG TOTAL) BY MOUTH DAILY. 90 tablet 3   metoprolol succinate (TOPROL-XL) 50 MG 24 hr tablet TAKE 1 TABLET BY MOUTH TWICE A DAY  180 tablet 2   Multiple Vitamin (MULTIVITAMIN WITH MINERALS) TABS tablet Take 1 tablet by mouth in the morning.     pseudoephedrine (SUDAFED) 30 MG tablet Take 30 mg by mouth See admin instructions. Take 1 tablet (30 mg) scheduled by mouth in the morning & may take an additional dose if needed for allergies/sinus issues.     spironolactone (ALDACTONE) 25 MG tablet TAKE 1 TABLET (25 MG TOTAL) BY MOUTH DAILY. 90 tablet 3   SUMAtriptan (IMITREX) 100 MG tablet TAKE 1 TABLET AT ONSET, MAY REPEAT IN 2 HOURS IF HEADACHE PERSISTS OR RECURS. 9 tablet 12   traMADol (ULTRAM) 50 MG tablet TAKE 1 TABLET BY MOUTH EVERY 12 HOURS AS NEEDED FOR ARTHRITIS PAIN 15 tablet 0   triamcinolone (NASACORT) 55 MCG/ACT AERO nasal inhaler Place 1 spray into the nose daily.     No current facility-administered medications for this visit.    Allergies:   Erythromycin   Social History:  The patient  reports that he has never smoked. He has never used smokeless tobacco. He reports current alcohol use of about 4.0 standard drinks of alcohol per week. He reports that he does not use drugs.   Family History:  The patient's family history includes Heart disease in his father, maternal grandfather, and mother; Lung cancer in his maternal uncle; Prostate cancer in his paternal grandfather.  ROS:  Please see the history of present illness.    All other systems are reviewed and otherwise negative.   PHYSICAL EXAM:  VS:  There were no vitals taken for this visit. BMI: There is no height or weight on file to calculate BMI. Well nourished, well developed, in no acute distress HEENT: normocephalic, atraumatic Neck: no JVD, carotid bruits or masses Cardiac: *** RRR; no significant murmurs, no rubs, or gallops Lungs: *** CTA b/l, no wheezing, rhonchi or rales Abd: soft, nontender MS: no deformity or atrophy Ext: *** no edema Skin: warm and dry, no rash Neuro:  No gross deficits appreciated Psych: euthymic mood, full affect  EKG:   not done today   02/19/23: TTE 1. Left ventricular ejection fraction, by estimation, is 50 to 55%. The  left ventricle has low normal function. The left ventricle has no regional  wall motion abnormalities. Left ventricular diastolic parameters were  normal. The average left ventricular   global longitudinal strain is -19.8 %. The global longitudinal strain is  normal.   2. Right ventricular systolic function is normal. The right ventricular  size is normal. There is normal pulmonary artery systolic pressure.   3. The mitral valve is normal in structure. Trivial mitral valve  regurgitation. No evidence of mitral stenosis.   4. The aortic valve is tricuspid. Aortic valve regurgitation is not  visualized. No aortic stenosis  is present.   5. The inferior vena cava is normal in size with greater than 50%  respiratory variability, suggesting right atrial pressure of 3 mmHg.    12/20/21: c.MRI IMPRESSION: 1. Normal LV size, mild hypertrophy, and mild systolic dysfunction (EF 48%) 2.  Normal RV size with mild systolic dysfunction (EF 46%) 3.  No late gadolinium enhancement to suggest myocardial scar   09/22/21: EPS/ablation CONCLUSIONS:   1. Typical atrial flutter   2. Successful radiofrequency ablation of atrial flutter along the cavotricuspid isthmus with complete bidirectional isthmus block achieved.   3. No inducible arrhythmias following ablation.   4. No early apparent complications.    04/14/21: stress myoview Findings are consistent with no ischemia. The study is low risk.   No ST deviation was noted.   LV perfusion is normal. There is no evidence of ischemia. There is no evidence of infarction.   Left ventricular function is abnormal. Global function is moderately reduced. Nuclear stress EF: 43 %. The left ventricular ejection fraction is moderately decreased (30-44%). End diastolic cavity size is normal.   Prior study not available for comparison.   Low risk stress nuclear  study with normal perfusion pattern. Gated images suggest reduced left ventricular global systolic function. Calculations can be erroneous with atrial fibrillation; recommend correlation with echocardiography.   02/23/21: TTE 1. Left ventricular ejection fraction, by estimation, is 30 to 35%. The  left ventricle has moderately decreased function. The left ventricle  demonstrates global hypokinesis. There is mild left ventricular  hypertrophy. Left ventricular diastolic  parameters are indeterminate.   2. Right ventricular systolic function is moderately reduced. The right  ventricular size is normal. There is normal pulmonary artery systolic  pressure. The estimated right ventricular systolic pressure is 21.8 mmHg.   3. The mitral valve is grossly normal. Mild mitral valve regurgitation.  No evidence of mitral stenosis.   4. The aortic valve is grossly normal. Aortic valve regurgitation is not  visualized. No aortic stenosis is present.   5. The inferior vena cava is normal in size with greater than 50%  respiratory variability, suggesting right atrial pressure of 3 mmHg.   Conclusion(s)/Recommendation(s): Paroxysmal afib during study. When in  sinus, EF appears slightly better than when in Afib (EF in afib 25%).    Recent Labs: 02/05/2023: BUN 19; Creatinine, Ser 1.01; Hemoglobin 14.3; Platelets 275; Potassium 4.2; Sodium 140  No results found for requested labs within last 365 days.   CrCl cannot be calculated (Patient's most recent lab result is older than the maximum 21 days allowed.).   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  The patient did not have any concerns regarding medicines.  Norma Fredrickson, PA-C 08/10/2023 10:41 AM     [facility] [address] [place] [address] [phone] (office)  [phone] (fax)

## 2023-08-12 ENCOUNTER — Ambulatory Visit: Payer: Medicare Other | Attending: Physician Assistant | Admitting: Physician Assistant

## 2023-08-12 ENCOUNTER — Encounter: Payer: Self-pay | Admitting: Physician Assistant

## 2023-08-12 VITALS — BP 104/62 | Ht 70.0 in | Wt 173.0 lb

## 2023-08-12 DIAGNOSIS — I420 Dilated cardiomyopathy: Secondary | ICD-10-CM | POA: Diagnosis not present

## 2023-08-12 DIAGNOSIS — R200 Anesthesia of skin: Secondary | ICD-10-CM | POA: Diagnosis not present

## 2023-08-12 DIAGNOSIS — I483 Typical atrial flutter: Secondary | ICD-10-CM | POA: Insufficient documentation

## 2023-08-12 DIAGNOSIS — R202 Paresthesia of skin: Secondary | ICD-10-CM | POA: Diagnosis not present

## 2023-08-12 NOTE — Patient Instructions (Signed)
 Medication Instructions:    STOP TAKING :  ELIQUIS    *If you need a refill on your cardiac medications before your next appointment, please call your pharmacy*   Lab Work: NONE ORDERED  TODAY     If you have labs (blood work) drawn today and your tests are completely normal, you will receive your results only by: MyChart Message (if you have MyChart) OR A paper copy in the mail If you have any lab test that is abnormal or we need to change your treatment, we will call you to review the results.   Testing/Procedures:  Your physician has requested that you have a carotid duplex. This test is an ultrasound of the carotid arteries in your neck. It looks at blood flow through these arteries that supply the brain with blood. Allow one hour for this exam. There are no restrictions or special instructions.   Your physician has requested that you have a lower or upper extremity arterial duplex. This test is an ultrasound of the arteries in the legs or arms. It looks at arterial blood flow in the legs and arms. Allow one hour for Lower and Upper Arterial scans. There are no restrictions or special instructions.  Please note: We ask at that you not bring children with you during ultrasound (echo/ vascular) testing. Due to room size and safety concerns, children are not allowed in the ultrasound rooms during exams. Our front office staff cannot provide observation of children in our lobby area while testing is being conducted. An adult accompanying a patient to their appointment will only be allowed in the ultrasound room at the discretion of the ultrasound technician under special circumstances. We apologize for any inconvenience.      Follow-Up: At [facility], you and your health needs are our priority.  As part of our continuing mission to provide you with exceptional heart care, we have created designated Provider Care Teams.  These Care Teams include your primary Cardiologist  (physician) and Advanced Practice Providers (APPs -  Physician Assistants and Nurse Practitioners) who all work together to provide you with the care you need, when you need it.  We recommend signing up for the patient portal called "MyChart".  Sign up information is provided on this After Visit Summary.  MyChart is used to connect with patients for Virtual Visits (Telemedicine).  Patients are able to view lab/test results, encounter notes, upcoming appointments, etc.  Non-urgent messages can be sent to your provider as well.   To learn more about what you can do with MyChart, go to [email or web address].    Your next appointment:   6 month(s)  ( CONTACT  CASSIE HALL/ ANGELINE HAMMER FOR EP SCHEDULING ISSUES )   Provider:   You may see Lanier Prude, MD   Other Instructions   1st Floor: - Lobby - Registration  - Pharmacy  - Lab - Cafe  2nd Floor: - PV Lab - Diagnostic Testing (echo, CT, nuclear med)  3rd Floor: - Vacant  4th Floor: - TCTS (cardiothoracic surgery) - AFib Clinic - Structural Heart Clinic - Vascular Surgery  - Vascular Ultrasound  5th Floor: - HeartCare Cardiology (general and EP) - Clinical Pharmacy for coumadin, hypertension, lipid, weight-loss medications, and med management appointments    Valet parking services will be available as well.

## 2023-08-16 ENCOUNTER — Other Ambulatory Visit: Payer: Self-pay | Admitting: Family Medicine

## 2023-08-16 DIAGNOSIS — E785 Hyperlipidemia, unspecified: Secondary | ICD-10-CM

## 2023-08-20 ENCOUNTER — Encounter: Payer: Self-pay | Admitting: Family Medicine

## 2023-08-20 ENCOUNTER — Other Ambulatory Visit: Payer: Self-pay | Admitting: Family Medicine

## 2023-08-20 DIAGNOSIS — E785 Hyperlipidemia, unspecified: Secondary | ICD-10-CM

## 2023-08-29 ENCOUNTER — Ambulatory Visit: Admitting: Family Medicine

## 2023-08-29 ENCOUNTER — Encounter: Payer: Self-pay | Admitting: Family Medicine

## 2023-08-29 VITALS — BP 120/76 | HR 71 | Temp 98.7°F | Ht 70.0 in | Wt 175.2 lb

## 2023-08-29 DIAGNOSIS — H6123 Impacted cerumen, bilateral: Secondary | ICD-10-CM

## 2023-08-29 DIAGNOSIS — I483 Typical atrial flutter: Secondary | ICD-10-CM | POA: Diagnosis not present

## 2023-08-29 DIAGNOSIS — E785 Hyperlipidemia, unspecified: Secondary | ICD-10-CM

## 2023-08-29 DIAGNOSIS — I420 Dilated cardiomyopathy: Secondary | ICD-10-CM

## 2023-08-29 DIAGNOSIS — E049 Nontoxic goiter, unspecified: Secondary | ICD-10-CM | POA: Diagnosis not present

## 2023-08-29 MED ORDER — ATORVASTATIN CALCIUM 40 MG PO TABS
40.0000 mg | ORAL_TABLET | Freq: Every day | ORAL | 3 refills | Status: DC
Start: 2023-08-29 — End: 2024-05-18

## 2023-08-29 NOTE — Progress Notes (Signed)
 Established Patient Office Visit   Subjective  Patient ID: Joseph Gill, male    DOB: 09/13/1950  Age: 73 y.o. MRN: [id number]  Chief Complaint  Patient presents with   Annual Exam   Follow-up    Chronic conditions.       Patient is a 73 year old male seen for follow-up on chronic conditions.  Initially scheduled as CPE however AWV done 03/2023.  Patient had recent follow-up with cardiology.  Eliquis discontinued.  Requesting refill on statin.  States doing well overall.    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   Heart murmur    Hyperlipidemia    Irregular heart beat    MIGRAINE HEADACHE 11/03/2008   Past Surgical History:  Procedure Laterality Date   A-FLUTTER ABLATION N/A 09/22/2021   Procedure: A-FLUTTER ABLATION;  Surgeon: Lanier Prude, MD;  Location: [facility] INVASIVE CV LAB;  Service: Cardiovascular;  Laterality: N/A;   CATARACT EXTRACTION, BILATERAL  2015   EYE SURGERY  cataracts   NASAL SEPTUM SURGERY     NASAL SINUS SURGERY     TONSILLECTOMY     Social History   Tobacco Use   Smoking status: Never   Smokeless tobacco: Never  Vaping Use   Vaping status: Never Used  Substance Use Topics   Alcohol use: Yes    Alcohol/week: 4.0 standard drinks of alcohol    Types: 1 Glasses of wine, 1 Cans of beer, 1 Shots of liquor, 1  Standard drinks or equivalent per week    Comment: occ   Drug use: No   Family History  Problem Relation Age of Onset   Heart disease Mother    Heart disease Father    Lung cancer Maternal Uncle    Heart disease Maternal Grandfather    Prostate cancer Paternal Grandfather    Allergies  Allergen Reactions   Erythromycin     Feels terrible!      ROS Negative unless stated above    Objective:     BP 120/76 (BP Location: Left Arm, Patient Position: Sitting, Cuff Size: Normal)   Pulse 71   Temp 98.7 F (37.1 C) (Oral)   Ht 5\' 10"  (1.778 m)   Wt 175 lb 3.2 oz (79.5 kg)   SpO2 95%   BMI 25.14 kg/m  BP Readings from Last 3 Encounters:  08/29/23 120/76  08/12/23 104/62  04/10/23 122/62   Wt Readings from Last 3 Encounters:  08/29/23 175 lb 3.2 oz (79.5 kg)  08/12/23 173 lb (78.5 kg)  04/10/23 178 lb 3.2 oz (80.8 kg)    Physical Exam Constitutional:      Appearance: Normal appearance.  HENT:     Head: Normocephalic and atraumatic.     Right Ear: Tympanic membrane and external ear normal. There is impacted cerumen.  Left Ear: Tympanic membrane and external ear normal. There is impacted cerumen.     Nose: Nose normal.     Mouth/Throat:     Mouth: Mucous membranes are moist.     Pharynx: No oropharyngeal exudate or posterior oropharyngeal erythema.  Eyes:     General: No scleral icterus.    Extraocular Movements: Extraocular movements intact.     Conjunctiva/sclera: Conjunctivae normal.     Pupils: Pupils are equal, round, and reactive to light.  Neck:     Thyroid: Thyromegaly present.  Cardiovascular:     Rate and Rhythm: Normal rate and regular rhythm.     Pulses: Normal pulses.     Heart sounds: Normal heart sounds. No murmur heard.    No friction rub.  Pulmonary:     Effort: Pulmonary effort is normal.     Breath sounds: Normal breath sounds. No wheezing, rhonchi or rales.  Abdominal:     General: Bowel sounds are normal.     Palpations: Abdomen is  soft.     Tenderness: There is no abdominal tenderness.  Musculoskeletal:        General: No deformity. Normal range of motion.  Lymphadenopathy:     Cervical: No cervical adenopathy.  Skin:    General: Skin is warm and dry.     Findings: No lesion.  Neurological:     General: No focal deficit present.     Mental Status: He is alert and oriented to person, place, and time.  Psychiatric:        Mood and Affect: Mood normal.        Thought Content: Thought content normal.       08/29/2023    3:47 PM 04/10/2023    2:22 PM 04/03/2022    9:27 AM  Depression screen PHQ 2/9  Decreased Interest 0 0 0  Down, Depressed, Hopeless 0 0 0  PHQ - 2 Score 0 0 0  Altered sleeping 1    Tired, decreased energy 1    Change in appetite 0    Feeling bad or failure about yourself  0    Trouble concentrating 0    Moving slowly or fidgety/restless 0    Suicidal thoughts 0    PHQ-9 Score 2    Difficult doing work/chores Not difficult at all        08/29/2023    3:47 PM  GAD 7 : Generalized Anxiety Score  Nervous, Anxious, on Edge 0  Control/stop worrying 0  Worry too much - different things 0  Trouble relaxing 0  Restless 0  Easily annoyed or irritable 0  Afraid - awful might happen 0  Total GAD 7 Score 0  Anxiety Difficulty Not difficult at all    No results found for any visits on 08/29/23.    Assessment & Plan:  Dyslipidemia -Recheck cholesterol this visit -Restart atorvastatin 40 mg daily -Lifestyle modifications -     Lipid panel; Future -     Atorvastatin Calcium; Take 1 tablet (40 mg total) by mouth daily.  Dispense: 90 tablet; Refill: 3  Goiter -Base of right neck enlarged -Patient asymptomatic.  Would like to proceed with labs and ultrasound. -     TSH; Future -     T4, free -     US THYROID; Future  Bilateral impacted cerumen -Consent obtained.  Bilateral ears irrigated.  Patient tolerated procedure well. -Consider OTC Debrox eardrops  Dilated cardiomyopathy  (HCC) -Stable -Echo 02/19/2023 EF 50-55%.  Left  ventricle has low normal function.  Left ventricle has no regional wall motion abnormalities. -Continue current medications including Toprol-XL 50 mg twice daily, spironolactone 25 mg daily, -Also continue Jardiance 10 mg daily -Continue follow-up with cardiology -     CBC with Differential/Platelet; Future -     Comprehensive metabolic panel; Future -     Lipid panel; Future -     TSH; Future -     T4, free  Typical atrial flutter (HCC) -Stable status post ablation 09/22/2021 -Eliquis DC'd by cardiology. -     Comprehensive metabolic panel; Future -     TSH; Future -     T4, free   Return in about 6 months (around 02/29/2024), or if symptoms worsen or fail to improve.   Deeann Saint, MD

## 2023-08-30 LAB — COMPREHENSIVE METABOLIC PANEL
ALT: 20 U/L (ref 0–53)
AST: 25 U/L (ref 0–37)
Albumin: 4.7 g/dL (ref 3.5–5.2)
Alkaline Phosphatase: 57 U/L (ref 39–117)
BUN: 15 mg/dL (ref 6–23)
CO2: 25 meq/L (ref 19–32)
Calcium: 9.9 mg/dL (ref 8.4–10.5)
Chloride: 102 meq/L (ref 96–112)
Creatinine, Ser: 0.9 mg/dL (ref 0.40–1.50)
GFR: 85.76 mL/min (ref >60.00)
Glucose, Bld: 83 mg/dL (ref 70–99)
Potassium: 4.2 meq/L (ref 3.5–5.1)
Sodium: 140 meq/L (ref 135–145)
Total Bilirubin: 0.4 mg/dL (ref 0.2–1.2)
Total Protein: 7.7 g/dL (ref 6.0–8.3)

## 2023-08-30 LAB — CBC WITH DIFFERENTIAL/PLATELET
Basophils Absolute: 0.1 10*3/uL (ref 0.0–0.1)
Basophils Relative: 1.2 % (ref 0.0–3.0)
Eosinophils Absolute: 0.3 10*3/uL (ref 0.0–0.7)
Eosinophils Relative: 3.9 % (ref 0.0–5.0)
HCT: 47.8 % (ref 39.0–52.0)
Hemoglobin: 15.7 g/dL (ref 13.0–17.0)
Lymphocytes Relative: 23.9 % (ref 12.0–46.0)
Lymphs Abs: 1.8 10*3/uL (ref 0.7–4.0)
MCHC: 32.9 g/dL (ref 30.0–36.0)
MCV: 96.2 fl (ref 78.0–100.0)
Monocytes Absolute: 0.8 10*3/uL (ref 0.1–1.0)
Monocytes Relative: 11.4 % (ref 3.0–12.0)
Neutro Abs: 4.4 10*3/uL (ref 1.4–7.7)
Neutrophils Relative %: 59.6 % (ref 43.0–77.0)
Platelets: 274 10*3/uL (ref 150.0–400.0)
RBC: 4.97 Mil/uL (ref 4.22–5.81)
RDW: 13.3 % (ref 11.5–15.5)
WBC: 7.4 10*3/uL (ref 4.0–10.5)

## 2023-08-30 LAB — LDL CHOLESTEROL, DIRECT: Direct LDL: 125 mg/dL

## 2023-08-30 LAB — LIPID PANEL
Cholesterol: 280 mg/dL — ABNORMAL HIGH (ref 0–200)
HDL: 43.8 mg/dL (ref >39.00)
NonHDL: 236.02
Total CHOL/HDL Ratio: 6
Triglycerides: 541 mg/dL — ABNORMAL HIGH (ref 0.0–149.0)
VLDL: 108.2 mg/dL — ABNORMAL HIGH (ref 0.0–40.0)

## 2023-08-30 LAB — TSH: TSH: 1.85 u[IU]/mL (ref 0.35–5.50)

## 2023-08-30 LAB — T4, FREE: Free T4: 0.68 ng/dL (ref 0.60–1.60)

## 2023-09-04 ENCOUNTER — Ambulatory Visit
Admission: RE | Admit: 2023-09-04 | Discharge: 2023-09-04 | Disposition: A | Discharge status: Home / Self Care | Source: Ambulatory Visit | Attending: Family Medicine | Admitting: Family Medicine

## 2023-09-04 DIAGNOSIS — R221 Localized swelling, mass and lump, neck: Secondary | ICD-10-CM | POA: Diagnosis not present

## 2023-09-04 DIAGNOSIS — E049 Nontoxic goiter, unspecified: Secondary | ICD-10-CM

## 2023-09-05 ENCOUNTER — Encounter: Payer: Self-pay | Admitting: Family Medicine

## 2023-09-05 ENCOUNTER — Other Ambulatory Visit: Payer: Self-pay

## 2023-09-05 DIAGNOSIS — E785 Hyperlipidemia, unspecified: Secondary | ICD-10-CM

## 2023-09-08 ENCOUNTER — Encounter: Payer: Self-pay | Admitting: Family Medicine

## 2023-09-09 ENCOUNTER — Ambulatory Visit (HOSPITAL_BASED_OUTPATIENT_CLINIC_OR_DEPARTMENT_OTHER)
Admission: RE | Admit: 2023-09-09 | Discharge: 2023-09-09 | Disposition: A | Discharge status: Home / Self Care | Payer: Medicare Other | Source: Ambulatory Visit | Attending: Physician Assistant

## 2023-09-09 ENCOUNTER — Ambulatory Visit (HOSPITAL_COMMUNITY)
Admission: RE | Admit: 2023-09-09 | Discharge: 2023-09-09 | Disposition: A | Discharge status: Home / Self Care | Payer: Medicare Other | Source: Ambulatory Visit | Attending: Physician Assistant | Admitting: Physician Assistant

## 2023-09-09 DIAGNOSIS — R200 Anesthesia of skin: Secondary | ICD-10-CM | POA: Insufficient documentation

## 2023-09-09 DIAGNOSIS — R202 Paresthesia of skin: Secondary | ICD-10-CM

## 2023-09-09 DIAGNOSIS — M25531 Pain in right wrist: Secondary | ICD-10-CM | POA: Insufficient documentation

## 2023-09-11 ENCOUNTER — Encounter: Payer: Self-pay | Admitting: Gastroenterology

## 2023-09-11 ENCOUNTER — Ambulatory Visit: Payer: Medicare Other | Admitting: Gastroenterology

## 2023-09-11 VITALS — BP 90/60 | HR 68 | Ht 69.75 in | Wt 176.5 lb

## 2023-09-11 DIAGNOSIS — Z860101 Personal history of adenomatous and serrated colon polyps: Secondary | ICD-10-CM | POA: Diagnosis not present

## 2023-09-11 DIAGNOSIS — Z09 Encounter for follow-up examination after completed treatment for conditions other than malignant neoplasm: Secondary | ICD-10-CM

## 2023-09-11 DIAGNOSIS — Z860100 Personal history of colon polyps, unspecified: Secondary | ICD-10-CM

## 2023-09-11 MED ORDER — NA SULFATE-K SULFATE-MG SULF 17.5-3.13-1.6 GM/177ML PO SOLN: 1.0000 | Freq: Once | ORAL | 0 refills | Status: AC

## 2023-09-11 NOTE — Patient Instructions (Signed)
 _______________________________________________________  If your blood pressure at your visit was 140/90 or greater, please contact your primary care physician to follow up on this.  _______________________________________________________  If you are age 72 or older, your body mass index should be between 23-30. Your Body mass index is 25.51 kg/m. If this is out of the aforementioned range listed, please consider follow up with your Primary Care Provider.  If you are age 74 or younger, your body mass index should be between 19-25. Your Body mass index is 25.51 kg/m. If this is out of the aformentioned range listed, please consider follow up with your Primary Care Provider.   ________________________________________________________  The [facility] providers would like to encourage you to use [facility] to communicate with providers for non-urgent requests or questions.  Due to long hold times on the telephone, sending your provider a message by [facility] may be a faster and more efficient way to get a response.  Please allow 48 business hours for a response.  Please remember that this is for non-urgent requests.  _______________________________________________________  Joseph Gill have been scheduled for a colonoscopy. Please follow written instructions given to you at your visit today.   If you use inhalers (even only as needed), please bring them with you on the day of your procedure.  DO NOT TAKE 7 DAYS PRIOR TO TEST- Trulicity (dulaglutide) Ozempic, Wegovy (semaglutide) Mounjaro (tirzepatide) Bydureon Bcise (exanatide extended release)  DO NOT TAKE 1 DAY PRIOR TO YOUR TEST Rybelsus (semaglutide) Adlyxin (lixisenatide) Victoza (liraglutide) Byetta (exanatide) ___________________________________________________________________________

## 2023-09-11 NOTE — Progress Notes (Signed)
 09/11/2023 Joseph Gill [id number] 1950/12/03   HISTORY OF PRESENT ILLNESS: This is a 73 year old male who is a patient of Dr. Irving Burton.  He follows here for colonoscopies.  Last one in January 2018 as below.  Was given a 7-year recall.  He is here today to schedule that repeat colonoscopy.  When he made this appointment he was still on Eliquis for atrial flutter, but has history of ablation and they recently, last month, took him off of his Eliquis at his cardiology visit.  He has been off of that just about a month now.  No GI complaints at this time.  Sees occasional bright red blood on the toilet paper upon wiping.  Colonoscopy 06/2016:  - One 4 mm polyp in the mid ascending colon, removed with a cold snare. Resected and retrieved. - Diverticulosis in the left colon. - Internal hemorrhoids. - The examination was otherwise normal on direct and retroflexion views  Surgical [P], ascending, polyp - TUBULAR ADENOMA(S). - HIGH GRADE DYSPLASIA IS NOT IDENTIFIED.  Past Medical History:  Diagnosis Date   Adenomatous colon polyp    ALLERGIC RHINITIS 03/26/2007   Allergy    Arthritis    Atrial fibrillation (HCC)    Back pain    knee and leg pain   Cataract    Chronically dry eyes, bilateral    Complication of anesthesia    Per pt, hard to wake up past certain sedation!   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 He has never used smokeless tobacco. He reports current alcohol use of about 4.0 standard drinks of alcohol per  week. He reports that he does not use drugs. family history includes Heart attack in his father; Heart disease in his father, maternal grandfather, and mother; Lung cancer in his maternal uncle; Prostate cancer in his paternal grandfather. Allergies  Allergen Reactions   Erythromycin     Feels terrible!      Outpatient Encounter Medications as of 09/11/2023  Medication Sig   acetaminophen (TYLENOL) 500 MG tablet Take 1,000 mg by mouth every 8 (eight) hours as needed for moderate pain.   Ascorbic Acid (VITAMIN C) 1000 MG tablet Take 1,000 mg by mouth every Monday, Tuesday, Wednesday, Thursday, and Friday.   atorvastatin (LIPITOR) 40 MG tablet Take 1 tablet (40 mg total) by mouth daily.   bacitracin-polymyxin b (POLYSPORIN) ophthalmic ointment Place 1 Application into the left eye 2 (two) times daily. apply to eye every 12 hours while awake   Carboxymethylcellulose Sodium 1 % GEL Place 1 drop into both eyes daily as needed (dry eyes).   diphenhydrAMINE (BENADRYL) 25 MG tablet Take 25 mg by mouth every 6 (six) hours as needed for allergies.   diphenhydramine-acetaminophen (TYLENOL PM) 25-500 MG TABS tablet Take 1 tablet by mouth at bedtime.   empagliflozin (JARDIANCE) 10 MG TABS tablet TAKE 1 TABLET BY MOUTH EVERY DAY BEFORE BREAKFAST   loratadine (CLARITIN) 10 MG tablet Take 10 mg by mouth in the  morning.   losartan (COZAAR) 25 MG tablet Take 25 mg by mouth daily.   metoprolol succinate (TOPROL-XL) 50 MG 24 hr tablet TAKE 1 TABLET BY MOUTH TWICE A DAY   Multiple Vitamin (MULTIVITAMIN WITH MINERALS) TABS tablet Take 1 tablet by mouth in the morning.   pseudoephedrine (SUDAFED) 30 MG tablet Take 30 mg by mouth See admin instructions. Take 1 tablet (30 mg) scheduled by mouth in the morning & may take an additional dose if needed for allergies/sinus issues.   spironolactone (ALDACTONE) 25 MG tablet TAKE 1 TABLET (25 MG TOTAL) BY MOUTH DAILY.   SUMAtriptan (IMITREX) 100 MG tablet TAKE 1 TABLET AT  ONSET, MAY REPEAT IN 2 HOURS IF HEADACHE PERSISTS OR RECURS.   traMADol (ULTRAM) 50 MG tablet TAKE 1 TABLET BY MOUTH EVERY 12 HOURS AS NEEDED FOR ARTHRITIS PAIN   triamcinolone (NASACORT) 55 MCG/ACT AERO nasal inhaler Place 1 spray into the nose daily.   No facility-administered encounter medications on file as of 09/11/2023.    REVIEW OF SYSTEMS  : All other systems reviewed and negative except where noted in the History of Present Illness.   PHYSICAL EXAM: BP 90/60 (BP Location: Left Arm, Patient Position: Sitting, Cuff Size: Normal)   Pulse 68   Ht 5' 9.75" (1.772 m) Comment: height measured without shoes  Wt 176 lb 8 oz (80.1 kg)   BMI 25.51 kg/m  General: Well developed white male in no acute distress Head: Normocephalic and atraumatic Eyes:  Sclerae anicteric, conjunctiva pink. Ears: Normal auditory acuity Lungs: Clear throughout to auscultation; no W/R/R. Heart: Regular rate and rhythm; no M/R/G. Rectal:  Will be done at the time of colonoscopy. Musculoskeletal: Symmetrical with no gross deformities  Skin: No lesions on visible extremities Extremities: No edema  Neurological: Alert oriented x 4, grossly non-focal Psychological:  Alert and cooperative. Normal mood and affect  ASSESSMENT AND PLAN: *Personal history of colon polyps:  Had a 4 mm adenoma removed in 06/2016.  Repeat recommended in 7 years.  Will schedule with Dr. Myrtie Neither.  The risks, benefits, and alternatives to colonoscopy were discussed with the patient and he consents to proceed.   CC:  Deeann Saint, MD

## 2023-09-16 NOTE — Progress Notes (Signed)
 ____________________________________________________________  Attending physician addendum:  Thank you for sending this case to me. I have reviewed the entire note and agree with the plan.   Amada Jupiter, MD  ____________________________________________________________

## 2023-10-09 ENCOUNTER — Encounter: Payer: Self-pay | Admitting: Gastroenterology

## 2023-10-09 ENCOUNTER — Ambulatory Visit: Admitting: Gastroenterology

## 2023-10-09 VITALS — BP 103/67 | HR 74 | Temp 98.0°F | Resp 14 | Ht 69.75 in | Wt 176.5 lb

## 2023-10-09 DIAGNOSIS — D123 Benign neoplasm of transverse colon: Secondary | ICD-10-CM

## 2023-10-09 DIAGNOSIS — K648 Other hemorrhoids: Secondary | ICD-10-CM

## 2023-10-09 DIAGNOSIS — Z860101 Personal history of adenomatous and serrated colon polyps: Secondary | ICD-10-CM | POA: Diagnosis not present

## 2023-10-09 DIAGNOSIS — K5730 Diverticulosis of large intestine without perforation or abscess without bleeding: Secondary | ICD-10-CM

## 2023-10-09 DIAGNOSIS — D122 Benign neoplasm of ascending colon: Secondary | ICD-10-CM

## 2023-10-09 DIAGNOSIS — Z1211 Encounter for screening for malignant neoplasm of colon: Secondary | ICD-10-CM | POA: Diagnosis not present

## 2023-10-09 DIAGNOSIS — K635 Polyp of colon: Secondary | ICD-10-CM | POA: Diagnosis not present

## 2023-10-09 DIAGNOSIS — D125 Benign neoplasm of sigmoid colon: Secondary | ICD-10-CM | POA: Diagnosis not present

## 2023-10-09 DIAGNOSIS — Z860100 Personal history of colon polyps, unspecified: Secondary | ICD-10-CM

## 2023-10-09 DIAGNOSIS — I4891 Unspecified atrial fibrillation: Secondary | ICD-10-CM | POA: Diagnosis not present

## 2023-10-09 DIAGNOSIS — E785 Hyperlipidemia, unspecified: Secondary | ICD-10-CM | POA: Diagnosis not present

## 2023-10-09 MED ORDER — SODIUM CHLORIDE 0.9 % IV SOLN: 500.0000 mL | INTRAVENOUS | Status: DC

## 2023-10-09 NOTE — Progress Notes (Signed)
 Called to room to assist during endoscopic procedure.  Patient ID and intended procedure confirmed with present staff. Received instructions for my participation in the procedure from the performing physician.

## 2023-10-09 NOTE — Progress Notes (Signed)
 Pt's states no medical or surgical changes since previsit or office visit.

## 2023-10-09 NOTE — Progress Notes (Signed)
 Sedate, gd SR, tolerated procedure well, VSS, report to RN

## 2023-10-09 NOTE — Op Note (Signed)
 [facility] Patient Name: Joseph Gill Procedure Date: 10/09/2023 1:49 PM MRN: [id number] Endoscopist: Sherilyn Cooter L. Myrtie Neither , MD, [id number] Age: 73 Referring MD:  Date of Birth: [birth year] Gender: Male Account #: [id number] Procedure:                Colonoscopy Indications:              Surveillance: Personal history of adenomatous                            polyps on last colonoscopy > 5 years ago                           diminutive TA Jan 2018. no polyps 2007 Medicines:                Monitored Anesthesia Care Procedure:                Pre-Anesthesia Assessment:                           - Prior to the procedure, a History and Physical                            was performed, and patient medications and                            allergies were reviewed. The patient's tolerance of                            previous anesthesia was also reviewed. The risks                            and benefits of the procedure and the sedation                            options and risks were discussed with the patient.                            All questions were answered, and informed consent                            was obtained. Prior Anticoagulants: The patient has                            taken no anticoagulant or antiplatelet agents. ASA                            Grade Assessment: III - A patient with severe                            systemic disease. After reviewing the risks and                            benefits, the patient was deemed in satisfactory  condition to undergo the procedure.                           After obtaining informed consent, the colonoscope                            was passed under direct vision. Throughout the                            procedure, the patient's blood pressure, pulse, and                            oxygen saturations were monitored continuously. The                            CF [id number] #[id number] was  introduced through the anus                            and advanced to the the terminal ileum, with                            identification of the appendiceal orifice and IC                            valve. The colonoscopy was performed without                            difficulty. The patient tolerated the procedure                            well. The quality of the bowel preparation was                            good. The terminal ileum, ileocecal valve,                            appendiceal orifice, and rectum were photographed. Scope In: 2:15:32 PM Scope Out: 2:37:05 PM Scope Withdrawal Time: 0 hours 18 minutes 4 seconds  Total Procedure Duration: 0 hours 21 minutes 33 seconds  Findings:                 The perianal and digital rectal examinations were                            normal.                           The terminal ileum appeared normal.                           Repeat examination of right colon under NBI                            performed.  Five sessile polyps were found in the proximal                            transverse colon (4) and proximal ascending colon                            (1). The polyps were diminutive in size. These                            polyps were removed with a cold snare. Resection                            and retrieval were complete.                           Two sessile polyps were found in the mid sigmoid                            colon and distal transverse colon. The polyps were                            diminutive in size. These polyps were removed with                            a cold snare. Resection and retrieval were complete.                           Multiple diverticula were found in the left colon.                           Internal hemorrhoids were found. The hemorrhoids                            were large.                           The exam was otherwise without abnormality on                             direct and retroflexion views. Complications:            No immediate complications. Estimated Blood Loss:     Estimated blood loss was minimal. Impression:               - The examined portion of the ileum was normal.                           - Five diminutive polyps in the proximal transverse                            colon and in the ascending colon, removed with a                            cold snare. Resected and retrieved.                           -  Two diminutive polyps in the mid sigmoid colon                            and in the distal transverse colon, removed with a                            cold snare. Resected and retrieved.                           - Diverticulosis in the left colon.                           - Internal hemorrhoids.                           - The examination was otherwise normal on direct                            and retroflexion views. Recommendation:           - Patient has a contact number available for                            emergencies. The signs and symptoms of potential                            delayed complications were discussed with the                            patient. Return to normal activities tomorrow.                            Written discharge instructions were provided to the                            patient.                           - Resume previous diet.                           - Continue present medications.                           - Await pathology results.                           - Repeat colonoscopy is recommended for                            surveillance. The colonoscopy date will be                            determined after pathology results from today's                            exam become available  for review. Joseph Colver L. Dominic Friendly, MD 10/09/2023 2:42:20 PM This report has been signed electronically.

## 2023-10-09 NOTE — Patient Instructions (Signed)

## 2023-10-09 NOTE — Progress Notes (Signed)
 History and Physical:  This patient presents for endoscopic testing for: Encounter Diagnosis  Name Primary?   Hx of colonic polyps Yes    Surveillance colonoscopy for diminutive TA Jan 2018 No polyps 2007 Reports having had some constipation last year but resolved. Denies rectal bleeding   Patient is otherwise without complaints or active issues today.   Past Medical History: Past Medical History:  Diagnosis Date   Adenomatous colon polyp    ALLERGIC RHINITIS 03/26/2007   Allergy    Arthritis    Atrial fibrillation (HCC)    Back pain    knee and leg pain   Cataract    Chronically dry eyes, bilateral    Complication of anesthesia    Per pt, hard to wake up past certain sedation!   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    Outpatient Meds: Current Outpatient Medications  Medication Sig Dispense Refill   acetaminophen (TYLENOL) 500 MG tablet Take 1,000 mg by mouth every 8 (eight) hours as needed for moderate pain.     Ascorbic Acid (VITAMIN C) 1000 MG tablet Take 1,000 mg by mouth every Monday, Tuesday, Wednesday, Thursday, and Friday.     atorvastatin (LIPITOR) 40 MG tablet Take 1 tablet (40 mg total) by mouth daily. 90 tablet 3   diphenhydrAMINE (BENADRYL) 25 MG tablet Take 25 mg by mouth every 6 (six) hours as needed for allergies.     diphenhydramine-acetaminophen (TYLENOL PM) 25-500 MG TABS tablet Take 1 tablet by mouth at bedtime.      empagliflozin (JARDIANCE) 10 MG TABS tablet TAKE 1 TABLET BY MOUTH EVERY DAY BEFORE BREAKFAST 90 tablet 2   loratadine (CLARITIN) 10 MG tablet Take 10 mg by mouth in the morning.     losartan (COZAAR) 25 MG tablet Take 25 mg by mouth daily.     metoprolol succinate (TOPROL-XL) 50 MG 24 hr tablet TAKE 1 TABLET BY MOUTH TWICE A DAY 180 tablet 2   Multiple Vitamin (MULTIVITAMIN WITH MINERALS) TABS tablet Take 1 tablet by mouth in the morning.     pseudoephedrine (SUDAFED) 30 MG tablet Take 30 mg by mouth See admin instructions. Take 1 tablet (30 mg) scheduled by mouth in the morning & may take an additional dose if needed for allergies/sinus issues.     spironolactone (ALDACTONE) 25 MG tablet TAKE 1 TABLET (25 MG TOTAL) BY MOUTH DAILY. 90 tablet 3   bacitracin-polymyxin b (POLYSPORIN) ophthalmic ointment Place 1 Application into the left eye 2 (two) times daily. apply to eye every 12 hours while awake 3.5 g 0   Carboxymethylcellulose Sodium 1 % GEL Place 1 drop into both eyes daily as needed (dry eyes).     SUMAtriptan (IMITREX) 100 MG tablet TAKE 1 TABLET AT ONSET, MAY REPEAT IN 2 HOURS IF HEADACHE PERSISTS OR RECURS. 9 tablet 12   traMADol (ULTRAM) 50 MG tablet TAKE 1 TABLET BY MOUTH EVERY 12  HOURS AS NEEDED FOR ARTHRITIS PAIN 15 tablet 0   triamcinolone (NASACORT) 55 MCG/ACT AERO nasal inhaler Place 1 spray into the nose daily.     Current Facility-Administered Medications  Medication Dose Route Frequency Provider Last Rate Last Admin   0.9 %  sodium chloride infusion  500 mL Intravenous Continuous Danis, Roel Clarity III, MD          ___________________________________________________________________ Objective   Exam:  BP 103/75   Pulse 68   Temp 98 F (36.7 C)   Resp 12   Ht 5' 9.75" (1.772 m)   Wt 176 lb 8 oz (80.1 kg)   SpO2 (!) 81%   BMI 25.51 kg/m   CV: regular , S1/S2 Resp: clear to auscultation bilaterally, normal RR and effort noted GI: soft, no tenderness, with active bowel  sounds.   Assessment: Encounter Diagnosis  Name Primary?   Hx of colonic polyps Yes     Plan: Colonoscopy   The benefits and risks of the planned procedure(s) were described in detail with the patient or (when appropriate) their health care proxy.  Risks were outlined as including, but not limited to, bleeding, infection, perforation, adverse medication reaction leading to cardiac or pulmonary decompensation, pancreatitis (if ERCP).  The limitation of incomplete mucosal visualization was also discussed.  No guarantees or warranties were given.  The patient is appropriate for an endoscopic procedure in the ambulatory setting.   - Lorella Roles, MD

## 2023-10-10 ENCOUNTER — Telehealth: Payer: Self-pay | Admitting: *Deleted

## 2023-10-10 NOTE — Telephone Encounter (Signed)
  Follow up Call-     10/09/2023    1:34 PM  Call back number  Post procedure Call Back phone  # [phone]  Permission to leave phone message Yes     Patient questions:  Phone was answered but person hung up.

## 2023-10-15 LAB — SURGICAL PATHOLOGY

## 2023-10-16 ENCOUNTER — Encounter: Payer: Self-pay | Admitting: Gastroenterology

## 2023-10-22 DIAGNOSIS — D485 Neoplasm of uncertain behavior of skin: Secondary | ICD-10-CM | POA: Diagnosis not present

## 2023-10-22 DIAGNOSIS — L570 Actinic keratosis: Secondary | ICD-10-CM | POA: Diagnosis not present

## 2023-10-22 DIAGNOSIS — L820 Inflamed seborrheic keratosis: Secondary | ICD-10-CM | POA: Diagnosis not present

## 2023-12-11 ENCOUNTER — Ambulatory Visit (INDEPENDENT_AMBULATORY_CARE_PROVIDER_SITE_OTHER): Admitting: Family Medicine

## 2023-12-11 VITALS — BP 110/80 | HR 66 | Temp 99.0°F | Ht 69.75 in | Wt 174.8 lb

## 2023-12-11 DIAGNOSIS — H6123 Impacted cerumen, bilateral: Secondary | ICD-10-CM | POA: Diagnosis not present

## 2023-12-11 DIAGNOSIS — R519 Headache, unspecified: Secondary | ICD-10-CM

## 2023-12-11 NOTE — Progress Notes (Signed)
 Established Patient Office Visit   Subjective  Patient ID: Joseph Gill, male    DOB: [birth year]  Age: 73 y.o. MRN: [id number]  Chief Complaint  Patient presents with   Medical Management of Chronic Issues    Patient came in today for left ear pain, started 3 days ago, headaches, Patient states it just feels stuffed up     Patient is a 73 year old male seen for acute concern.  Patient endorses sensation of left ear feeling clogged/popping x 2 days.  Patient also had ear pain last night, 2/10.  Last time ears felt this way pt required irrigation.  Patient also notes a right temporal headache that started overnight.  Denies fever, chills, nausea, vomiting, rhinorrhea, postnasal drainage, sore throat.   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   Heart murmur    Hyperlipidemia    Irregular heart beat    MIGRAINE HEADACHE 11/03/2008   Past Surgical History:  Procedure Laterality Date   A-FLUTTER ABLATION N/A 09/22/2021   Procedure: A-FLUTTER ABLATION;  Surgeon: Boyce Byes, MD;  Location: [facility] INVASIVE CV LAB;  Service: Cardiovascular;  Laterality: N/A;   CATARACT EXTRACTION, BILATERAL  2015   NASAL SEPTUM SURGERY     TONSILLECTOMY     Social History   Tobacco Use    Smoking status: Never   Smokeless tobacco: Never  Vaping Use   Vaping status: Never Used  Substance Use Topics   Alcohol use: Yes    Alcohol/week: 4.0 standard drinks of alcohol    Types: 1 Glasses of wine, 1 Cans of beer, 1 Shots of liquor, 1 Standard drinks or equivalent per week    Comment: occ   Drug use: No   Family History  Problem Relation Age of Onset   Heart disease Mother    Heart disease Father    Heart attack Father    Heart disease Maternal Grandfather    Prostate cancer Paternal Grandfather    Lung cancer Maternal Uncle    Allergies  Allergen Reactions   Erythromycin     Feels terrible!    ROS Negative unless stated above    Objective:     BP 110/80 (BP Location: Left Arm, Patient Position: Sitting, Cuff Size: Normal)   Pulse 66   Temp 99 F (37.2 C) (Oral)   Ht 5' 9.75 (1.772 m)   Wt 174 lb 12.8 oz (79.3 kg)   SpO2 92%   BMI 25.26 kg/m  BP Readings from Last 3 Encounters:  12/11/23 110/80  10/09/23 103/67  09/11/23 90/60   Wt Readings from Last 3 Encounters:  12/11/23 174 lb 12.8 oz (79.3 kg)  10/09/23 176 lb 8 oz (80.1 kg)  09/11/23  176 lb 8 oz (80.1 kg)      Physical Exam Constitutional:      General: He is not in acute distress.    Appearance: Normal appearance.  HENT:     Head: Normocephalic and atraumatic.     Right Ear: There is impacted cerumen.     Left Ear: There is impacted cerumen.     Ears:     Comments: Cerumen removed from right canal with curette by this provider.  Some cerumen removed from left canal but thick impacted wax remained.  Left ear irrigated.  Moderate amount of cerumen remained.    Nose: Nose normal.     Mouth/Throat:     Mouth: Mucous membranes are moist.   Cardiovascular:     Rate and Rhythm: Normal rate and regular rhythm.     Heart sounds: Normal heart sounds. No murmur heard.    No gallop.  Pulmonary:     Effort: Pulmonary effort is normal. No respiratory distress.     Breath sounds: Normal  breath sounds. No wheezing, rhonchi or rales.   Skin:    General: Skin is warm and dry.   Neurological:     Mental Status: He is alert and oriented to person, place, and time.        08/29/2023    3:47 PM 04/10/2023    2:22 PM 04/03/2022    9:27 AM  Depression screen PHQ 2/9  Decreased Interest 0 0 0  Down, Depressed, Hopeless 0 0 0  PHQ - 2 Score 0 0 0  Altered sleeping 1    Tired, decreased energy 1    Change in appetite 0    Feeling bad or failure about yourself  0    Trouble concentrating 0    Moving slowly or fidgety/restless 0    Suicidal thoughts 0    PHQ-9 Score 2    Difficult doing work/chores Not difficult at all        08/29/2023    3:47 PM  GAD 7 : Generalized Anxiety Score  Nervous, Anxious, on Edge 0  Control/stop worrying 0  Worry too much - different things 0  Trouble relaxing 0  Restless 0  Easily annoyed or irritable 0  Afraid - awful might happen 0  Total GAD 7 Score 0  Anxiety Difficulty Not difficult at all     No results found for any visits on 12/11/23.    Assessment & Plan:   Bilateral impacted cerumen  Nonintractable headache, unspecified chronicity pattern, unspecified headache type  Consent obtained.  Curette used by this provider to remove cerumen from bilateral canals.  Cerumen remained in left canal.  Left canal irrigated, but cerumen remained.  Patient tolerated procedure.  OTC Debrox eardrops..  Hydration, rest, medications for headache/migraines.  Headache prevention.  Return if symptoms worsen or fail to improve.   Viola Greulich, MD

## 2023-12-19 ENCOUNTER — Ambulatory Visit: Admitting: Gastroenterology

## 2023-12-19 ENCOUNTER — Encounter: Payer: Self-pay | Admitting: Gastroenterology

## 2023-12-19 VITALS — BP 102/68 | HR 64 | Ht 69.75 in | Wt 174.0 lb

## 2023-12-19 DIAGNOSIS — K5900 Constipation, unspecified: Secondary | ICD-10-CM

## 2023-12-19 DIAGNOSIS — R350 Frequency of micturition: Secondary | ICD-10-CM

## 2023-12-19 DIAGNOSIS — R32 Unspecified urinary incontinence: Secondary | ICD-10-CM

## 2023-12-19 DIAGNOSIS — R194 Change in bowel habit: Secondary | ICD-10-CM

## 2023-12-19 NOTE — Progress Notes (Signed)
 [facility] Progress Note  Chief Complaint: Altered bowel habits and incontinence  Subjective  Prior history  Surveillance colonoscopy April 2025 (with Hx polyps noted in that report) Diverticulosis, inter HR, multiple SubCM polyps (TAs and HPs) - 3 year recall recommended After the procedure his wife said he needed to schedule an appointment with me due to some irregular bowel habits and incontinence.   Discussed the use of AI scribe software for clinical note transcription with the patient, who gave verbal consent to proceed.  History of Present Illness Joseph Gill is a 73 year old male who presents with bowel irregularities and urinary incontinence.  He has a history of bowel irregularities characterized by cycles of constipation followed by loose stools. Typically, he would experience constipation for about five days, followed by a sudden release of stool. This pattern has improved with dietary changes, particularly by increasing fruit intake, which has helped him achieve more regular bowel movements. Currently, he has bowel movements five to six days a week, with occasional days of constipation. Stool consistency varies, with some days being 'nice and good' and others less so. A recent episode of upset stomach after consuming bad milk may temporarily disrupt his bowel pattern.  He experiences urinary incontinence, particularly post-void dribbling. He no longer has stool incontinence, which was an issue in the past. The urinary incontinence occurs after urination, with continued dribbling once he has dressed. This issue is more pronounced at night when he has difficulty initiating urination after holding it during sleep. He has not previously consulted a urologist and is unsure about his PSA testing history.  His dietary habits include a focus on consuming sufficient fiber, primarily through fruits, and he has reduced his intake of fried foods and sugars. He relies on  his wife's judgment for vegetable intake and acknowledges the need to be mindful of his diet to maintain regular bowel movements.    ROS: Cardiovascular:  no chest pain Respiratory: no dyspnea  The patient's Past Medical, Family and Social History were reviewed and are on file in the EMR. Past Medical History:  Diagnosis Date   Adenomatous colon polyp    ALLERGIC RHINITIS 03/26/2007   Allergy    Arthritis    Atrial fibrillation (HCC)    Back pain    knee and leg pain   Cataract    Chronically dry eyes, bilateral    Complication of anesthesia    Per pt, hard to wake up past certain sedation!   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   Heart murmur    Hyperlipidemia    Irregular heart beat    MIGRAINE HEADACHE 11/03/2008    Past Surgical History:  Procedure Laterality Date   A-FLUTTER ABLATION N/A 09/22/2021   Procedure: A-FLUTTER ABLATION;  Surgeon: Cindie Ole DASEN, MD;  Location: [facility] INVASIVE CV LAB;  Service: Cardiovascular;  Laterality: N/A;   CATARACT EXTRACTION, BILATERAL  2015   EYE SURGERY  cataracts   NASAL SEPTUM SURGERY     TONSILLECTOMY       Objective:  Med list reviewed  Current Outpatient Medications:    acetaminophen  (TYLENOL ) 500 MG tablet, Take 1,000 mg by mouth every 8 (eight) hours as needed for moderate pain., Disp: , Rfl:    atorvastatin  (LIPITOR) 40 MG tablet, Take 1 tablet (40 mg total) by mouth daily., Disp: 90 tablet, Rfl: 3   bacitracin -polymyxin b  (POLYSPORIN ) ophthalmic ointment, Place 1 Application into the left eye 2 (two)  times daily. apply to eye every 12 hours while awake, Disp: 3.5 g, Rfl: 0   Carboxymethylcellulose Sodium 1 % GEL, Place 1 drop into both eyes daily as needed (dry eyes)., Disp: , Rfl:    diphenhydrAMINE (BENADRYL) 25 MG tablet, Take 25 mg by mouth every 6 (six) hours as needed for allergies., Disp: , Rfl:    diphenhydramine-acetaminophen  (TYLENOL  PM) 25-500 MG TABS tablet, Take 1 tablet by mouth  at bedtime., Disp: , Rfl:    empagliflozin  (JARDIANCE ) 10 MG TABS tablet, TAKE 1 TABLET BY MOUTH EVERY DAY BEFORE BREAKFAST, Disp: 90 tablet, Rfl: 2   loratadine (CLARITIN) 10 MG tablet, Take 10 mg by mouth in the morning., Disp: , Rfl:    losartan  (COZAAR ) 25 MG tablet, Take 25 mg by mouth daily., Disp: , Rfl:    metoprolol  succinate (TOPROL -XL) 50 MG 24 hr tablet, TAKE 1 TABLET BY MOUTH TWICE A DAY, Disp: 180 tablet, Rfl: 2   Multiple Vitamin (MULTIVITAMIN WITH MINERALS) TABS tablet, Take 1 tablet by mouth in the morning., Disp: , Rfl:    pseudoephedrine (SUDAFED) 30 MG tablet, Take 30 mg by mouth See admin instructions. Take 1 tablet (30 mg) scheduled by mouth in the morning & may take an additional dose if needed for allergies/sinus issues., Disp: , Rfl:    spironolactone  (ALDACTONE ) 25 MG tablet, TAKE 1 TABLET (25 MG TOTAL) BY MOUTH DAILY., Disp: 90 tablet, Rfl: 3   SUMAtriptan  (IMITREX ) 100 MG tablet, TAKE 1 TABLET AT ONSET, MAY REPEAT IN 2 HOURS IF HEADACHE PERSISTS OR RECURS., Disp: 9 tablet, Rfl: 12   traMADol  (ULTRAM ) 50 MG tablet, TAKE 1 TABLET BY MOUTH EVERY 12 HOURS AS NEEDED FOR ARTHRITIS PAIN, Disp: 15 tablet, Rfl: 0   triamcinolone (NASACORT) 55 MCG/ACT AERO nasal inhaler, Place 1 spray into the nose daily., Disp: , Rfl:    Vital signs in last 24 hrs: Vitals:   12/19/23 1337  BP: 102/68  Pulse: 64   Wt Readings from Last 3 Encounters:  12/19/23 174 lb (78.9 kg)  12/11/23 174 lb 12.8 oz (79.3 kg)  10/09/23 176 lb 8 oz (80.1 kg)    Physical Exam  Well-appearing HEENT: sclera anicteric, oral mucosa moist without lesions Neck: supple, no thyromegaly, JVD or lymphadenopathy Cardiac: Regular without appreciable murmur,  no peripheral edema Pulm: clear to auscultation bilaterally, normal RR and effort noted Abdomen: soft, no tenderness, with active bowel sounds. No guarding or palpable hepatosplenomegaly. Skin; warm and dry, no jaundice or  rash   Labs:   ___________________________________________ Radiologic studies:   ____________________________________________ Other:  1. Surgical [P], colon, transverse and ascending, polyp (5) :       -  TUBULAR ADENOMA, FRAGMENTS.        2. Surgical [P], colon, sigmoid and transverse, polyp (2) :       -  HYPERPLASTIC POLYP.       -  SEPARATE FRAGMENT OF COLONIC MUCOSA WITH MILD SUPERFICIAL HYPERPLASTIC CHANGE       AND PROMINENT LYMPHOID AGGREGATES.   _____________________________________________   Encounter Diagnoses  Name Primary?   Altered bowel habits Yes   Urinary frequency     Assessment and Plan  Although [facility] had irregular bowel habits more so the last year until he made some dietary changes, and we discussed dietary measures to help with that, it took some time at today's visit to understand that the incontinence troubling him and concerning his wife's urinary not fecal. Assessment & Plan Urinary incontinence Post-void  dribbling with possible prostate involvement. Difficulty with urinary stream initiation and cessation, especially at night. Differential includes prostate enlargement. - Talk to primary care about referral to for evaluation of urinary incontinence and prostate issues. - Check PSA status; order test if not previously done.  Constipation Improved bowel regularity with dietary changes, occasional constipation persists. - Continue dietary modifications with increased fruit intake. - Monitor bowel habits and adjust diet to prevent constipation.    Victory LITTIE Brand III

## 2023-12-19 NOTE — Patient Instructions (Signed)
 _______________________________________________________  If your blood pressure at your visit was 140/90 or greater, please contact your primary care physician to follow up on this.  _______________________________________________________  If you are age 72 or older, your body mass index should be between 23-30. Your Body mass index is 25.15 kg/m. If this is out of the aforementioned range listed, please consider follow up with your Primary Care Provider.  If you are age 46 or younger, your body mass index should be between 19-25. Your Body mass index is 25.15 kg/m. If this is out of the aformentioned range listed, please consider follow up with your Primary Care Provider.   ________________________________________________________  The [facility] providers would like to encourage you to use MYCHART to communicate with providers for non-urgent requests or questions.  Due to long hold times on the telephone, sending your provider a message by [facility] may be a faster and more efficient way to get a response.  Please allow 48 business hours for a response.  Please remember that this is for non-urgent requests.  _______________________________________________________  Thank you for trusting me with your gastrointestinal care!    Dr. Victory Legrand [facility]

## 2024-01-30 ENCOUNTER — Other Ambulatory Visit: Payer: Self-pay | Admitting: Cardiology

## 2024-01-30 DIAGNOSIS — I420 Dilated cardiomyopathy: Secondary | ICD-10-CM

## 2024-02-03 DIAGNOSIS — Z23 Encounter for immunization: Secondary | ICD-10-CM | POA: Diagnosis not present

## 2024-02-28 ENCOUNTER — Other Ambulatory Visit: Payer: Self-pay | Admitting: Cardiology

## 2024-02-28 DIAGNOSIS — I420 Dilated cardiomyopathy: Secondary | ICD-10-CM

## 2024-03-25 DIAGNOSIS — Z23 Encounter for immunization: Secondary | ICD-10-CM | POA: Diagnosis not present

## 2024-03-30 ENCOUNTER — Ambulatory Visit: Admitting: Family Medicine

## 2024-03-30 ENCOUNTER — Encounter: Payer: Self-pay | Admitting: Family Medicine

## 2024-03-30 VITALS — BP 98/60 | HR 62 | Temp 98.7°F | Ht 69.75 in | Wt 174.6 lb

## 2024-03-30 DIAGNOSIS — N3943 Post-void dribbling: Secondary | ICD-10-CM

## 2024-03-30 DIAGNOSIS — I483 Typical atrial flutter: Secondary | ICD-10-CM

## 2024-03-30 DIAGNOSIS — E785 Hyperlipidemia, unspecified: Secondary | ICD-10-CM | POA: Diagnosis not present

## 2024-03-30 DIAGNOSIS — Z125 Encounter for screening for malignant neoplasm of prostate: Secondary | ICD-10-CM | POA: Diagnosis not present

## 2024-03-30 LAB — POCT URINALYSIS DIPSTICK
Bilirubin, UA: NEGATIVE
Blood, UA: NEGATIVE
Glucose, UA: POSITIVE — AB
Ketones, UA: NEGATIVE
Leukocytes, UA: NEGATIVE
Nitrite, UA: NEGATIVE
Protein, UA: NEGATIVE
Spec Grav, UA: 1.02 (ref 1.010–1.025)
Urobilinogen, UA: 0.2 U/dL
pH, UA: 6 (ref 5.0–8.0)

## 2024-03-30 LAB — PSA, MEDICARE: PSA: 0.81 ng/mL (ref 0.10–4.00)

## 2024-03-30 MED ORDER — TAMSULOSIN HCL 0.4 MG PO CAPS: 0.4000 mg | ORAL_CAPSULE | Freq: Every day | ORAL | 3 refills | Status: DC

## 2024-03-30 NOTE — Progress Notes (Signed)
 Established Patient Office Visit   Subjective  Patient ID: Joseph Gill, male    DOB: December 24, 1950  Age: 73 y.o. MRN: [id number]  Chief Complaint  Patient presents with   Medical Management of Chronic Issues    Patient came in today for 6 month follow-up for A-Flutter     Pt is a 73 yo male seen for f/u.  Pt denies palpitations.  Planning to speak to Cardiology at next appt about Jardiance .  States it is expensive ~$5 per pill.  Interested in stopping it since it's for prevention.  Pt noticing a gradual increase in urinary incontinence since April.  Having to urinate 2 times per night.  Endorses incomplete bladder emptying and postvoid dribbling.  Denies increased daytime frequency.    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   Heart murmur    Hyperlipidemia    Irregular heart beat    MIGRAINE HEADACHE 11/03/2008   Past Surgical History:  Procedure Laterality Date   A-FLUTTER ABLATION N/A 09/22/2021   Procedure: A-FLUTTER ABLATION;  Surgeon: Cindie Ole DASEN, MD;  Location: [facility] INVASIVE CV LAB;  Service: Cardiovascular;  Laterality: N/A;   CATARACT EXTRACTION, BILATERAL  2015   EYE SURGERY  cataracts   NASAL SEPTUM SURGERY     TONSILLECTOMY     Social History   Tobacco  Use   Smoking status: Never   Smokeless tobacco: Never  Vaping Use   Vaping status: Never Used  Substance Use Topics   Alcohol use: Yes    Alcohol/week: 4.0 standard drinks of alcohol    Types: 1 Glasses of wine, 1 Cans of beer, 1 Shots of liquor, 1 Standard drinks or equivalent per week    Comment: occ   Drug use: No   Family History  Problem Relation Age of Onset   Heart disease Mother    Heart disease Father    Heart attack Father    Heart disease Maternal Grandfather    Prostate cancer Paternal Grandfather    Lung cancer Maternal Uncle    Allergies  Allergen Reactions   Erythromycin     Feels terrible!    ROS Negative unless stated above    Objective:     BP 98/60 (BP Location: Left Arm, Patient Position: Sitting, Cuff Size: Large)   Pulse 62   Temp 98.7 F (37.1 C) (Oral)   Ht 5' 9.75 (1.772 m)   Wt 174 lb 9.6 oz (79.2 kg)   SpO2 95%   BMI 25.23 kg/m  BP Readings from Last 3 Encounters:  03/30/24 98/60  12/19/23 102/68  12/11/23 110/80   Wt Readings from Last 3 Encounters:  03/30/24 174 lb 9.6 oz (79.2 kg)  12/19/23  174 lb (78.9 kg)  12/11/23 174 lb 12.8 oz (79.3 kg)      Physical Exam Constitutional:      General: He is not in acute distress.    Appearance: Normal appearance.  HENT:     Head: Normocephalic and atraumatic.     Nose: Nose normal.     Mouth/Throat:     Mouth: Mucous membranes are moist.  Cardiovascular:     Rate and Rhythm: Normal rate and regular rhythm.     Heart sounds: Normal heart sounds. No murmur heard.    No gallop.  Pulmonary:     Effort: Pulmonary effort is normal. No respiratory distress.     Breath sounds: Normal breath sounds. No wheezing, rhonchi or rales.  Abdominal:     General: Bowel sounds are normal.     Palpations: Abdomen is soft.     Tenderness: There is no abdominal tenderness.  Skin:    General: Skin is warm and dry.  Neurological:     Mental Status: He is alert and oriented to person, place,  and time.        03/30/2024    2:11 PM 12/11/2023    2:07 PM 08/29/2023    3:47 PM  Depression screen PHQ 2/9  Decreased Interest 0 0 0  Down, Depressed, Hopeless 0 0 0  PHQ - 2 Score 0 0 0  Altered sleeping 1 0 1  Tired, decreased energy 1 0 1  Change in appetite 0 0 0  Feeling bad or failure about yourself  0 0 0  Trouble concentrating 0 0 0  Moving slowly or fidgety/restless 0 0 0  Suicidal thoughts 0 0 0  PHQ-9 Score 2 0 2  Difficult doing work/chores Not difficult at all Not difficult at all Not difficult at all      03/30/2024    2:12 PM 12/11/2023    2:07 PM 08/29/2023    3:47 PM  GAD 7 : Generalized Anxiety Score  Nervous, Anxious, on Edge 0 0 0  Control/stop worrying 0 0 0  Worry too much - different things 0 0 0  Trouble relaxing 0 0 0  Restless 0 0 0  Easily annoyed or irritable 0 0 0  Afraid - awful might happen 0 0 0  Total GAD 7 Score 0 0 0  Anxiety Difficulty  Not difficult at all Not difficult at all     Results for orders placed or performed in visit on 03/30/24  POCT urinalysis dipstick  Result Value Ref Range   Color, UA yellow    Clarity, UA Clear    Glucose, UA Positive (A) Negative   Bilirubin, UA neg    Ketones, UA neg    Spec Grav, UA 1.020 1.010 - 1.025   Blood, UA neg    pH, UA 6.0 5.0 - 8.0   Protein, UA Negative Negative   Urobilinogen, UA 0.2 0.2 or 1.0 E.U./dL   Nitrite, UA neg    Leukocytes, UA Negative Negative   Appearance     Odor        Assessment & Plan:   Post-void dribbling -     POCT urinalysis dipstick -     PSA, Medicare; Future -     Tamsulosin HCl; Take 1 capsule (0.4 mg total) by mouth daily.  Dispense: 30 capsule; Refill: 3  Prostate cancer screening -     PSA, Medicare; Future  Typical atrial flutter (HCC) -     Comprehensive  metabolic panel with GFR; Future  Dyslipidemia -     Lipid panel  Pt with postvoid dribbling, nocturia, and incomplete bladder emptying.  Concern for BPH.  Obtain PSA and POC UA.   POC UA with 3+ glucose, likely from Jardiance  use.  Patient to discuss Jardiance  with cardiology.  Will start Flomax.  For continued or worsening symptoms refer to urology.  Recheck lipid panel.  Previously ordered however not done as triglycerides were noted to be extremely high, 541 on 08/29/2023.  Continue Lipitor 40 mg daily.  Return in about 3 months (around 06/30/2024), or if symptoms worsen or fail to improve.   Clotilda JONELLE Single, MD

## 2024-03-30 NOTE — Patient Instructions (Addendum)
 You urine was largely normal, but did have increased glucose in it.  This is likely from the Jardiance .  This can also give you urinary frequency as discussed.  A prescription for Flomax was sent to your pharmacy.  This is a medication to help with BPH symptoms.  You can try taking this to see if you notice any improvement in symptoms.  For continued symptoms we can place a referral to the urologist.

## 2024-03-31 LAB — COMPREHENSIVE METABOLIC PANEL WITH GFR
AG Ratio: 2 (calc) (ref 1.0–2.5)
ALT: 21 U/L (ref 9–46)
AST: 21 U/L (ref 10–35)
Albumin: 4.7 g/dL (ref 3.6–5.1)
Alkaline phosphatase (APISO): 61 U/L (ref 35–144)
BUN: 18 mg/dL (ref 7–25)
CO2: 29 mmol/L (ref 20–32)
Calcium: 9.9 mg/dL (ref 8.6–10.3)
Chloride: 102 mmol/L (ref 98–110)
Creat: 1.13 mg/dL (ref 0.70–1.28)
Globulin: 2.4 g/dL (ref 1.9–3.7)
Glucose, Bld: 89 mg/dL (ref 65–99)
Potassium: 4.5 mmol/L (ref 3.5–5.3)
Sodium: 138 mmol/L (ref 135–146)
Total Bilirubin: 0.3 mg/dL (ref 0.2–1.2)
Total Protein: 7.1 g/dL (ref 6.1–8.1)
eGFR: 69 mL/min/1.73m2 (ref >=60)

## 2024-03-31 LAB — LIPID PANEL
Cholesterol: 168 mg/dL (ref 0–200)
HDL: 44 mg/dL (ref >39.00)
LDL Cholesterol: 52 mg/dL (ref 0–99)
NonHDL: 124.12
Total CHOL/HDL Ratio: 4
Triglycerides: 363 mg/dL — ABNORMAL HIGH (ref 0.0–149.0)
VLDL: 72.6 mg/dL — ABNORMAL HIGH (ref 0.0–40.0)

## 2024-04-14 ENCOUNTER — Ambulatory Visit: Payer: Self-pay | Admitting: Family Medicine

## 2024-04-15 NOTE — Telephone Encounter (Signed)
 Patient is going to take 2 of the 40mg  and contact the office when a refill is needed

## 2024-05-04 ENCOUNTER — Other Ambulatory Visit: Payer: Self-pay

## 2024-05-06 MED ORDER — METOPROLOL SUCCINATE ER 50 MG PO TB24: 50.0000 mg | ORAL_TABLET | Freq: Two times a day (BID) | ORAL | 0 refills | Status: AC

## 2024-05-18 ENCOUNTER — Other Ambulatory Visit: Payer: Self-pay | Admitting: Family Medicine

## 2024-05-18 DIAGNOSIS — E785 Hyperlipidemia, unspecified: Secondary | ICD-10-CM

## 2024-05-18 NOTE — Telephone Encounter (Unsigned)
 Copied from CRM #[id number]. Topic: Clinical - Medication Refill >> May 18, 2024 12:30 PM Shardie S wrote: Medication:  atorvastatin  (LIPITOR) 40 MG tablet, Patient states dosage increased to 2 tablets (80 MG)   Has the patient contacted their pharmacy? Yes (Agent: If no, request that the patient contact the pharmacy for the refill. If patient does not wish to contact the pharmacy document the reason why and proceed with request.) (Agent: If yes, when and what did the pharmacy advise?)  This is the patient's preferred pharmacy:  [id number] [address], [place] - [address] [place] [address] Phone: [phone] Fax: [phone]    Is this the correct pharmacy for this prescription? Yes If no, delete pharmacy and type the correct one.   Has the prescription been filled recently? No  Is the patient out of the medication? Yes  Has the patient been seen for an appointment in the last year OR does the patient have an upcoming appointment? Yes  Can we respond through MyChart? Yes  Agent: Please be advised that Rx refills may take up to 3 business days. We ask that you follow-up with your pharmacy.

## 2024-05-19 MED ORDER — ATORVASTATIN CALCIUM 40 MG PO TABS: 40.0000 mg | ORAL_TABLET | Freq: Every day | ORAL | 3 refills | Status: DC

## 2024-05-27 ENCOUNTER — Encounter: Payer: Self-pay | Admitting: Family Medicine

## 2024-05-27 DIAGNOSIS — E785 Hyperlipidemia, unspecified: Secondary | ICD-10-CM

## 2024-05-28 MED ORDER — ATORVASTATIN CALCIUM 40 MG PO TABS: 80.0000 mg | ORAL_TABLET | Freq: Every day | ORAL | 3 refills | Status: AC

## 2024-06-25 ENCOUNTER — Other Ambulatory Visit: Payer: Self-pay | Admitting: Family Medicine

## 2024-06-25 DIAGNOSIS — N3943 Post-void dribbling: Secondary | ICD-10-CM

## 2024-07-01 ENCOUNTER — Ambulatory Visit: Admitting: Family Medicine

## 2024-07-01 ENCOUNTER — Encounter: Payer: Self-pay | Admitting: Family Medicine

## 2024-07-01 VITALS — BP 98/62 | HR 67 | Temp 98.8°F | Ht 69.75 in | Wt 181.8 lb

## 2024-07-01 DIAGNOSIS — E785 Hyperlipidemia, unspecified: Secondary | ICD-10-CM

## 2024-07-01 DIAGNOSIS — N3943 Post-void dribbling: Secondary | ICD-10-CM

## 2024-07-01 DIAGNOSIS — I483 Typical atrial flutter: Secondary | ICD-10-CM

## 2024-07-01 DIAGNOSIS — N401 Enlarged prostate with lower urinary tract symptoms: Secondary | ICD-10-CM

## 2024-07-01 NOTE — Progress Notes (Addendum)
 "  Established Patient Office Visit   Subjective  Patient ID: Joseph Gill, male    DOB: 01-09-1951  Age: 74 y.o. MRN: [id number]  Chief Complaint  Patient presents with   Medical Management of Chronic Issues    Patient came in today for 3 month follow-up, Post void Dribbling, Dyslipidemia, A-Flutter     Pt is a 74 yo male seen for f/u.  Pt states flomax  is helping with BPH symptoms.  May get up 1-2 x per night to urinate.  No longer having post-void dribbling.   Felt faint on the first day of taking Flomax  and occasionally feels lightheaded when ascending stairs quickly.  Increasing water intake which helps.  BP low normal on several medications for a-flutter/dilated cardiomyopathy including toprol , spironolactone , losartan .  He discontinued Jardiance  due to cost and because it was prescribed for prevention. He was unaware of its potential to increase urination.He had issues obtaining a prescription for his statin, which was resolved after Thanksgiving. He sometimes experiences joint pain or muscle soreness, particularly after physical activity. The joint pain is not frequent unless he engages in strenuous activities.  He experiences headaches once or twice a week, which are less severe than before. He has not used Imitrex  recently as acetaminophen  has been sufficient to manage the headaches. Eating or drinking water often alleviates the headaches.  He has gained weight over the past three months, attributing this to snacking at night, often consuming cookies and milk. He tries to eat an apple before bed but acknowledges that less healthy choices contribute to weight gain.    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   Heart murmur    Hyperlipidemia    Irregular heart beat    MIGRAINE HEADACHE 11/03/2008   Past Surgical History:  Procedure Laterality Date   A-FLUTTER ABLATION N/A 09/22/2021   Procedure: A-FLUTTER ABLATION;  Surgeon: Cindie Ole DASEN, MD;  Location: [facility] INVASIVE CV LAB;  Service: Cardiovascular;  Laterality: N/A;   CATARACT EXTRACTION, BILATERAL  2015   EYE SURGERY  cataracts   NASAL SEPTUM SURGERY     TONSILLECTOMY     Social History[1] Family History  Problem Relation Age of Onset   Heart disease Mother    Heart disease Father    Heart attack Father    Heart disease Maternal Grandfather    Prostate cancer Paternal Grandfather    Lung cancer Maternal Uncle    Allergies[2]  ROS Negative unless stated above    Objective:     BP 98/62 (BP Location: Right Arm, Patient Position: Sitting, Cuff Size: Large)   Pulse 67   Temp 98.8 F (37.1 C) (Oral)   Ht 5' 9.75 (1.772 m)  Wt 181 lb 12.8 oz (82.5 kg)   SpO2 95%   BMI 26.27 kg/m  BP Readings from Last 3 Encounters:  07/01/24 98/62  03/30/24 98/60  12/19/23 102/68   Wt Readings from Last 3 Encounters:  07/01/24 181 lb 12.8 oz (82.5 kg)  03/30/24 174 lb 9.6 oz (79.2 kg)  12/19/23 174 lb (78.9 kg)      Physical Exam Constitutional:      General: He is not in acute distress.    Appearance: Normal appearance.  HENT:     Head: Normocephalic and atraumatic.     Nose: Nose normal.     Mouth/Throat:     Mouth: Mucous membranes are moist.  Cardiovascular:     Rate and  Rhythm: Normal rate and regular rhythm.     Heart sounds: Normal heart sounds. No murmur heard.    No gallop.  Pulmonary:     Effort: Pulmonary effort is normal. No respiratory distress.     Breath sounds: Normal breath sounds. No wheezing, rhonchi or rales.  Skin:    General: Skin is warm and dry.  Neurological:     Mental Status: He is alert and oriented to person, place, and time.        07/01/2024    2:07 PM 03/30/2024    2:11 PM 12/11/2023    2:07 PM  Depression screen PHQ 2/9  Decreased Interest 0 0 0  Down, Depressed, Hopeless 0 0 0  PHQ - 2 Score 0 0 0  Altered sleeping 1 1 0  Tired, decreased energy 1 1 0  Change in appetite 0 0 0  Feeling bad or failure about yourself  0 0 0  Trouble concentrating 0 0 0  Moving slowly or fidgety/restless 0 0 0  Suicidal thoughts 0 0 0  PHQ-9 Score 2 2  0   Difficult doing work/chores  Not difficult at all Not difficult at all     Data saved with a previous flowsheet row definition      07/01/2024    2:07 PM 03/30/2024    2:12 PM 12/11/2023    2:07 PM 08/29/2023    3:47 PM  GAD 7 : Generalized Anxiety Score  Nervous, Anxious, on Edge 0 0 0 0  Control/stop worrying 0 0 0 0  Worry too much - different things 0 0 0 0  Trouble relaxing 0 0 0 0  Restless 0 0 0 0  Easily annoyed or irritable 0 0 0 0  Afraid - awful might happen 0 0 0 0  Total GAD 7 Score 0 0 0 0  Anxiety Difficulty   Not difficult at all Not difficult at all     No results found for any visits on 07/01/24.    Assessment & Plan:   Benign prostatic hyperplasia with post-void dribbling  Dyslipidemia  Typical atrial flutter (HCC)   BPH symptoms improving since starting Flomax .  Continue med.  Refill available at pharmacy.  Trigylcerides high at 363 on 03/30/24.  Continue atorvastatin  80 mg daily.  Consider taking co-Q10 supplement for questionable myalgias noted mostly with increased physical activity.  For continued symptoms consider injectable cholesterol  medication. Recheck lipids at next OFV.   A-flutter stable.  Continue Toprol -XL 50 mg twice daily.  Continue follow-up with cardiology.  Monitor blood pressure as on the lower side.  Return in about 5 months (around 11/29/2024).   Clotilda JONELLE Single, MD     [1]  Social History Tobacco Use  Smoking status: Never   Smokeless tobacco: Never  Vaping Use   Vaping status: Never Used  Substance Use Topics   Alcohol use: Yes    Alcohol/week: 4.0 standard drinks of alcohol    Types: 1 Glasses of wine, 1 Cans of beer, 1 Shots of liquor, 1 Standard drinks or equivalent per week    Comment: occ   Drug use: No  [2]  Allergies Allergen Reactions   Erythromycin     Feels terrible!   "

## 2024-07-29 ENCOUNTER — Ambulatory Visit

## 2024-07-29 VITALS — BP 120/60 | HR 68 | Temp 98.0°F | Ht 69.75 in | Wt 181.7 lb

## 2024-07-29 DIAGNOSIS — Z0000 Encounter for general adult medical examination without abnormal findings: Secondary | ICD-10-CM

## 2024-07-29 NOTE — Progress Notes (Signed)
 "  Chief Complaint  Patient presents with   Medicare Wellness     Subjective:   Joseph Gill is a 74 y.o. male who presents for a Medicare Annual Wellness Visit.  Visit info / Clinical Intake: Medicare Wellness Visit Type:: Subsequent Annual Wellness Visit Persons participating in visit and providing information:: patient Medicare Wellness Visit Mode:: In-person (required for WTM) Interpreter Needed?: No Pre-visit prep was completed: yes AWV questionnaire completed by patient prior to visit?: yes Date:: 07/27/24 Living arrangements:: lives with spouse/significant other Patient's Overall Health Status Rating: good Typical amount of pain: some Does pain affect daily life?: no Are you currently prescribed opioids?: no  Dietary Habits and Nutritional Risks How many meals a day?: 3 Eats fruit and vegetables daily?: yes Most meals are obtained by: preparing own meals In the last 2 weeks, have you had any of the following?: none Diabetic:: no  Functional Status Activities of Daily Living (to include ambulation/medication): Independent Ambulation: Independent with device- listed below Home Assistive Devices/Equipment: Eyeglasses Medication Administration: Independent Home Management (perform basic housework or laundry): Independent Manage your own finances?: yes Primary transportation is: driving Concerns about vision?: no *vision screening is required for WTM* Concerns about hearing?: no  Fall Screening Falls in the past year?: 0 Number of falls in past year: 0 Was there an injury with Fall?: 0 Fall Risk Category Calculator: 0 Patient Fall Risk Level: Low Fall Risk  Fall Risk Patient at Risk for Falls Due to: No Fall Risks Fall risk Follow up: Falls evaluation completed  Home and Transportation Safety: All rugs have non-skid backing?: (!) no All stairs or steps have railings?: (!) no Grab bars in the bathtub or shower?: (!) no Have non-skid surface in bathtub or  shower?: (!) no Good home lighting?: yes Regular seat belt use?: yes Hospital stays in the last year:: no  Cognitive Assessment Difficulty concentrating, remembering, or making decisions? : no Will 6CIT or Mini Cog be Completed: yes What year is it?: 0 points What month is it?: 0 points Give patient an address phrase to remember (5 components): [address] [place]  About what time is it?: 0 points Count backwards from 20 to 1: 0 points Say the months of the year in reverse: 0 points Repeat the address phrase from earlier: 0 points 6 CIT Score: 0 points  Advance Directives (For Healthcare) Does Patient Have a Medical Advance Directive?: Yes Does patient want to make changes to medical advance directive?: No - Patient declined Type of Advance Directive: Healthcare Power of [place]; Living will Copy of Healthcare Power of Attorney in Chart?: No - copy requested Copy of Living Will in Chart?: No - copy requested  Reviewed/Updated  Reviewed/Updated: Reviewed All (Medical, Surgical, Family, Medications, Allergies, Care Teams, Patient Goals)    Allergies (verified) Erythromycin   Current Medications (verified) Outpatient Encounter Medications as of 07/29/2024  Medication Sig   acetaminophen  (TYLENOL ) 500 MG tablet Take 1,000 mg by mouth every 8 (eight) hours as needed for moderate pain.   atorvastatin  (LIPITOR) 40 MG tablet Take 2 tablets (80 mg total) by mouth daily.   bacitracin -polymyxin b  (POLYSPORIN ) ophthalmic ointment Place 1 Application into the left eye 2 (two) times daily. apply to eye every 12 hours while awake (Patient not taking: Reported on 07/29/2024)   Carboxymethylcellulose Sodium 1 % GEL Place 1 drop into both eyes daily as needed (dry eyes).   diphenhydrAMINE (BENADRYL) 25 MG tablet Take 25 mg by mouth every 6 (six) hours as  needed for allergies.   diphenhydramine-acetaminophen  (TYLENOL  PM) 25-500 MG TABS tablet Take 1 tablet by mouth at bedtime.   loratadine  (CLARITIN) 10 MG tablet Take 10 mg by mouth in the morning.   losartan  (COZAAR ) 25 MG tablet TAKE 1 TABLET (25 MG TOTAL) BY MOUTH DAILY.   metoprolol  succinate (TOPROL -XL) 50 MG 24 hr tablet Take 1 tablet (50 mg total) by mouth 2 (two) times daily.   Multiple Vitamin (MULTIVITAMIN WITH MINERALS) TABS tablet Take 1 tablet by mouth in the morning.   pseudoephedrine (SUDAFED) 30 MG tablet Take 30 mg by mouth See admin instructions. Take 1 tablet (30 mg) scheduled by mouth in the morning & may take an additional dose if needed for allergies/sinus issues.   spironolactone  (ALDACTONE ) 25 MG tablet TAKE 1 TABLET (25 MG TOTAL) BY MOUTH DAILY.   SUMAtriptan  (IMITREX ) 100 MG tablet TAKE 1 TABLET AT ONSET, MAY REPEAT IN 2 HOURS IF HEADACHE PERSISTS OR RECURS.   tamsulosin  (FLOMAX ) 0.4 MG CAPS capsule TAKE 1 CAPSULE BY MOUTH EVERY DAY   traMADol  (ULTRAM ) 50 MG tablet TAKE 1 TABLET BY MOUTH EVERY 12 HOURS AS NEEDED FOR ARTHRITIS PAIN   triamcinolone (NASACORT) 55 MCG/ACT AERO nasal inhaler Place 1 spray into the nose daily.   No facility-administered encounter medications on file as of 07/29/2024.    History: Past Medical History:  Diagnosis Date   Adenomatous colon polyp    ALLERGIC RHINITIS 03/26/2007   Allergy    Arthritis    Atrial fibrillation (HCC)    Back pain    knee and leg pain   Cataract    Chronically dry eyes, bilateral    Complication of anesthesia    Per pt, hard to wake up past certain sedation!   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   Heart murmur    Hyperlipidemia    Irregular heart beat    MIGRAINE HEADACHE 11/03/2008   Past Surgical History:  Procedure Laterality Date   A-FLUTTER ABLATION N/A 09/22/2021   Procedure: A-FLUTTER ABLATION;  Surgeon: Cindie Ole DASEN, MD;  Location: [facility] INVASIVE CV LAB;  Service: Cardiovascular;  Laterality: N/A;   CATARACT EXTRACTION, BILATERAL  2015   EYE SURGERY  cataracts   NASAL SEPTUM SURGERY     TONSILLECTOMY      Family History  Problem Relation Age of Onset   Heart disease Mother    Heart disease Father    Heart attack Father    Heart disease Maternal Grandfather    Prostate cancer Paternal Grandfather    Lung cancer Maternal Uncle    Social History   Occupational History   Occupation: retired  Tobacco Use   Smoking status: Never   Smokeless tobacco: Never  Vaping Use   Vaping status: Never Used  Substance and Sexual Activity   Alcohol use: Yes    Alcohol/week: 4.0 standard drinks of alcohol    Types: 1 Glasses of wine, 1 Cans of beer, 1 Shots of liquor, 1 Standard drinks or equivalent per week    Comment: occ   Drug use: Never   Sexual activity: Not Currently    Birth control/protection: None   Tobacco Counseling Counseling given: No  SDOH Screenings   Food Insecurity: No Food Insecurity (07/29/2024)  Housing: Low Risk (07/29/2024)  Transportation Needs: No Transportation Needs (07/29/2024)  Utilities: Not At Risk (07/29/2024)  Alcohol Screen: Low Risk (12/11/2023)  Depression (PHQ2-9): Low Risk (07/29/2024)  Financial Resource Strain: Low Risk (07/27/2024)  Physical Activity:  Insufficiently Active (07/29/2024)  Social Connections: Moderately Isolated (07/29/2024)  Stress: No Stress Concern Present (07/29/2024)  Tobacco Use: Low Risk (07/29/2024)  Health Literacy: Adequate Health Literacy (07/29/2024)   See flowsheets for full screening details  Depression Screen PHQ 2 & 9 Depression Scale- Over the past 2 weeks, how often have you been bothered by any of the following problems? Little interest or pleasure in doing things: 0 Feeling down, depressed, or hopeless (PHQ Adolescent also includes...irritable): 0 PHQ-2 Total Score: 0 Trouble falling or staying asleep, or sleeping too much: 0 Feeling tired or having little energy: 0 Poor appetite or overeating (PHQ Adolescent also includes...weight loss): 0 Feeling bad about yourself - or that you are a failure or have let yourself or your  family down: 0 Trouble concentrating on things, such as reading the newspaper or watching television (PHQ Adolescent also includes...like school work): 0 Moving or speaking so slowly that other people could have noticed. Or the opposite - being so fidgety or restless that you have been moving around a lot more than usual: 0 Thoughts that you would be better off dead, or of hurting yourself in some way: 0 PHQ-9 Total Score: 0 If you checked off any problems, how difficult have these problems made it for you to do your work, take care of things at home, or get along with other people?: Not difficult at all     Goals Addressed               This Visit's Progress     Increase physical activity (pt-stated)        Remain active!             Objective:    Today's Vitals   07/29/24 1525  BP: 120/60  Pulse: 68  Temp: 98 F (36.7 C)  TempSrc: Oral  SpO2: 97%  Weight: 181 lb 11.2 oz (82.4 kg)  Height: 5' 9.75 (1.772 m)   Body mass index is 26.26 kg/m.  Hearing/Vision screen Hearing Screening - Comments:: Denies hearing difficulties   Vision Screening - Comments:: Wears rx glasses - up to date with routine eye exams with  [facility] Immunizations and Health Maintenance Health Maintenance  Topic Date Due   COVID-19 Vaccine (10 - 2025-26 season) 03/30/2024   Medicare Annual Wellness (AWV)  07/29/2025   Colonoscopy  10/09/2026   DTaP/Tdap/Td (4 - Td or Tdap) 04/17/2034   Pneumococcal Vaccine: 50+ Years  Completed   Influenza Vaccine  Completed   Hepatitis C Screening  Completed   Zoster Vaccines- Shingrix  Completed   Meningococcal B Vaccine  Aged Out        Assessment/Plan:  This is a routine wellness examination for Joseph Gill.  Patient Care Team: Mercer Clotilda SAUNDERS, MD as PCP - General (Family Medicine) Cindie Ole DASEN, MD (Inactive) as PCP - Electrophysiology (Cardiology)  I have personally reviewed and noted the following in the patients chart:   Medical and social  history Use of alcohol, tobacco or illicit drugs  Current medications and supplements including opioid prescriptions. Functional ability and status Nutritional status Physical activity Advanced directives List of other physicians Hospitalizations, surgeries, and ER visits in previous 12 months Vitals Screenings to include cognitive, depression, and falls Referrals and appointments  No orders of the defined types were placed in this encounter.  In addition, I have reviewed and discussed with patient certain preventive protocols, quality metrics, and best practice recommendations. A written personalized care plan for preventive services as well as  general preventive health recommendations were provided to patient.   Joseph LELON Blush, LPN   12/28/7971   Return in 53 weeks (on 08/04/2025).  After Visit Summary: (In Person-Printed) AVS printed and given to the patient  Nurse Notes: No voiced or noted concerns at this time "

## 2024-07-29 NOTE — Patient Instructions (Addendum)
 Joseph Gill,  Thank you for taking the time for your Medicare Wellness Visit. I appreciate your continued commitment to your health goals. Please review the care plan we discussed, and feel free to reach out if I can assist you further.  Please note that Annual Wellness Visits do not include a physical exam. Some assessments may be limited, especially if the visit was conducted virtually. If needed, we may recommend an in-person follow-up with your provider.  Ongoing Care Seeing your primary care provider every 3 to 6 months helps us  monitor your health and provide consistent, personalized care.   Referrals If a referral was made during today's visit and you haven't received any updates within two weeks, please contact the referred provider directly to check on the status.  Recommended Screenings:  Health Maintenance  Topic Date Due   COVID-19 Vaccine (10 - 2025-26 season) 03/30/2024   Medicare Annual Wellness Visit  07/29/2025   Colon Cancer Screening  10/09/2026   DTaP/Tdap/Td vaccine (4 - Td or Tdap) 04/17/2034   Pneumococcal Vaccine for age over 99  Completed   Flu Shot  Completed   Hepatitis C Screening  Completed   Zoster (Shingles) Vaccine  Completed   Meningitis B Vaccine  Aged Out       07/29/2024    3:35 PM  Advanced Directives  Does Patient Have a Medical Advance Directive? Yes  Type of Estate Agent of [place];Living will  Does patient want to make changes to medical advance directive? No - Patient declined  Copy of Healthcare Power of Attorney in Chart? No - copy requested    Vision: Annual vision screenings are recommended for early detection of glaucoma, cataracts, and diabetic retinopathy. These exams can also reveal signs of chronic conditions such as diabetes and high blood pressure.  Dental: Annual dental screenings help detect early signs of oral cancer, gum disease, and other conditions linked to overall health, including heart disease  and diabetes.  Please see the attached documents for additional preventive care recommendations.

## 2024-11-30 ENCOUNTER — Ambulatory Visit: Admitting: Family Medicine

## 2025-08-04 ENCOUNTER — Ambulatory Visit
